# Patient Record
Sex: Female | Born: 1940 | Race: White | Hispanic: No | Smoking: Never smoker
Health system: Southern US, Community
[De-identification: ages and names within clinical notes are randomized; demographics above are authoritative.]

## PROBLEM LIST (undated history)

## (undated) DIAGNOSIS — J189 Pneumonia, unspecified organism: Secondary | ICD-10-CM

## (undated) DIAGNOSIS — F329 Major depressive disorder, single episode, unspecified: Secondary | ICD-10-CM

## (undated) DIAGNOSIS — R569 Unspecified convulsions: Secondary | ICD-10-CM

## (undated) DIAGNOSIS — K219 Gastro-esophageal reflux disease without esophagitis: Secondary | ICD-10-CM

## (undated) DIAGNOSIS — J302 Other seasonal allergic rhinitis: Secondary | ICD-10-CM

## (undated) DIAGNOSIS — G43909 Migraine, unspecified, not intractable, without status migrainosus: Secondary | ICD-10-CM

## (undated) DIAGNOSIS — M1990 Unspecified osteoarthritis, unspecified site: Secondary | ICD-10-CM

## (undated) DIAGNOSIS — F32A Depression, unspecified: Secondary | ICD-10-CM

## (undated) DIAGNOSIS — F419 Anxiety disorder, unspecified: Secondary | ICD-10-CM

## (undated) DIAGNOSIS — I639 Cerebral infarction, unspecified: Secondary | ICD-10-CM

## (undated) DIAGNOSIS — G40909 Epilepsy, unspecified, not intractable, without status epilepticus: Secondary | ICD-10-CM

## (undated) DIAGNOSIS — B019 Varicella without complication: Secondary | ICD-10-CM

## (undated) DIAGNOSIS — R011 Cardiac murmur, unspecified: Secondary | ICD-10-CM

## (undated) DIAGNOSIS — I10 Essential (primary) hypertension: Secondary | ICD-10-CM

## (undated) HISTORY — DX: Major depressive disorder, single episode, unspecified: F32.9

## (undated) HISTORY — DX: Depression, unspecified: F32.A

## (undated) HISTORY — PX: LYMPHADENECTOMY: SHX15

## (undated) HISTORY — DX: Epilepsy, unspecified, not intractable, without status epilepticus: G40.909

## (undated) HISTORY — DX: Other seasonal allergic rhinitis: J30.2

## (undated) HISTORY — DX: Migraine, unspecified, not intractable, without status migrainosus: G43.909

## (undated) HISTORY — PX: APPENDECTOMY: SHX54

## (undated) HISTORY — DX: Essential (primary) hypertension: I10

## (undated) HISTORY — DX: Varicella without complication: B01.9

## (undated) HISTORY — PX: KNEE ARTHROSCOPY: SUR90

## (undated) HISTORY — PX: ABDOMINAL HYSTERECTOMY: SHX81

## (undated) HISTORY — DX: Cardiac murmur, unspecified: R01.1

## (undated) HISTORY — DX: Gastro-esophageal reflux disease without esophagitis: K21.9

## (undated) HISTORY — PX: TONSILLECTOMY AND ADENOIDECTOMY: SUR1326

## (undated) HISTORY — DX: Anxiety disorder, unspecified: F41.9

## (undated) HISTORY — PX: CATARACT EXTRACTION: SUR2

---

## 2011-12-29 DIAGNOSIS — H35039 Hypertensive retinopathy, unspecified eye: Secondary | ICD-10-CM | POA: Diagnosis not present

## 2011-12-29 DIAGNOSIS — H5200 Hypermetropia, unspecified eye: Secondary | ICD-10-CM | POA: Diagnosis not present

## 2011-12-29 DIAGNOSIS — H2510 Age-related nuclear cataract, unspecified eye: Secondary | ICD-10-CM | POA: Diagnosis not present

## 2012-02-10 DIAGNOSIS — F331 Major depressive disorder, recurrent, moderate: Secondary | ICD-10-CM | POA: Diagnosis not present

## 2012-02-18 DIAGNOSIS — F331 Major depressive disorder, recurrent, moderate: Secondary | ICD-10-CM | POA: Diagnosis not present

## 2012-02-21 DIAGNOSIS — M545 Low back pain: Secondary | ICD-10-CM | POA: Diagnosis not present

## 2012-02-21 DIAGNOSIS — I10 Essential (primary) hypertension: Secondary | ICD-10-CM | POA: Diagnosis not present

## 2012-02-24 DIAGNOSIS — F331 Major depressive disorder, recurrent, moderate: Secondary | ICD-10-CM | POA: Diagnosis not present

## 2012-03-02 DIAGNOSIS — F331 Major depressive disorder, recurrent, moderate: Secondary | ICD-10-CM | POA: Diagnosis not present

## 2012-03-08 DIAGNOSIS — F331 Major depressive disorder, recurrent, moderate: Secondary | ICD-10-CM | POA: Diagnosis not present

## 2012-03-21 DIAGNOSIS — F331 Major depressive disorder, recurrent, moderate: Secondary | ICD-10-CM | POA: Diagnosis not present

## 2012-04-05 DIAGNOSIS — F331 Major depressive disorder, recurrent, moderate: Secondary | ICD-10-CM | POA: Diagnosis not present

## 2012-04-26 DIAGNOSIS — F331 Major depressive disorder, recurrent, moderate: Secondary | ICD-10-CM | POA: Diagnosis not present

## 2012-05-11 DIAGNOSIS — F331 Major depressive disorder, recurrent, moderate: Secondary | ICD-10-CM | POA: Diagnosis not present

## 2012-06-05 DIAGNOSIS — F331 Major depressive disorder, recurrent, moderate: Secondary | ICD-10-CM | POA: Diagnosis not present

## 2012-06-14 DIAGNOSIS — F331 Major depressive disorder, recurrent, moderate: Secondary | ICD-10-CM | POA: Diagnosis not present

## 2012-06-16 DIAGNOSIS — I10 Essential (primary) hypertension: Secondary | ICD-10-CM | POA: Diagnosis not present

## 2012-06-16 DIAGNOSIS — Z7901 Long term (current) use of anticoagulants: Secondary | ICD-10-CM | POA: Diagnosis not present

## 2012-06-19 DIAGNOSIS — I10 Essential (primary) hypertension: Secondary | ICD-10-CM | POA: Diagnosis not present

## 2012-06-19 DIAGNOSIS — Z7901 Long term (current) use of anticoagulants: Secondary | ICD-10-CM | POA: Diagnosis not present

## 2012-06-19 DIAGNOSIS — E876 Hypokalemia: Secondary | ICD-10-CM | POA: Diagnosis not present

## 2012-06-21 DIAGNOSIS — F331 Major depressive disorder, recurrent, moderate: Secondary | ICD-10-CM | POA: Diagnosis not present

## 2012-06-23 DIAGNOSIS — E876 Hypokalemia: Secondary | ICD-10-CM | POA: Diagnosis not present

## 2012-06-28 DIAGNOSIS — Z6827 Body mass index (BMI) 27.0-27.9, adult: Secondary | ICD-10-CM | POA: Diagnosis not present

## 2012-06-28 DIAGNOSIS — E876 Hypokalemia: Secondary | ICD-10-CM | POA: Diagnosis not present

## 2012-06-28 DIAGNOSIS — R002 Palpitations: Secondary | ICD-10-CM | POA: Diagnosis not present

## 2012-06-28 DIAGNOSIS — G40909 Epilepsy, unspecified, not intractable, without status epilepticus: Secondary | ICD-10-CM | POA: Diagnosis not present

## 2012-07-03 DIAGNOSIS — E876 Hypokalemia: Secondary | ICD-10-CM | POA: Diagnosis not present

## 2012-07-03 DIAGNOSIS — F331 Major depressive disorder, recurrent, moderate: Secondary | ICD-10-CM | POA: Diagnosis not present

## 2012-07-03 DIAGNOSIS — G40909 Epilepsy, unspecified, not intractable, without status epilepticus: Secondary | ICD-10-CM | POA: Diagnosis not present

## 2012-07-03 DIAGNOSIS — R002 Palpitations: Secondary | ICD-10-CM | POA: Diagnosis not present

## 2012-08-08 ENCOUNTER — Ambulatory Visit: Payer: Self-pay | Admitting: Licensed Clinical Social Worker

## 2012-08-17 ENCOUNTER — Ambulatory Visit (INDEPENDENT_AMBULATORY_CARE_PROVIDER_SITE_OTHER): Payer: Medicare Other | Admitting: Licensed Clinical Social Worker

## 2012-08-17 DIAGNOSIS — IMO0002 Reserved for concepts with insufficient information to code with codable children: Secondary | ICD-10-CM | POA: Diagnosis not present

## 2012-09-12 ENCOUNTER — Ambulatory Visit: Payer: Medicare Other | Admitting: Licensed Clinical Social Worker

## 2012-12-13 DIAGNOSIS — H35039 Hypertensive retinopathy, unspecified eye: Secondary | ICD-10-CM | POA: Diagnosis not present

## 2012-12-13 DIAGNOSIS — H2510 Age-related nuclear cataract, unspecified eye: Secondary | ICD-10-CM | POA: Diagnosis not present

## 2012-12-13 DIAGNOSIS — H524 Presbyopia: Secondary | ICD-10-CM | POA: Diagnosis not present

## 2013-01-17 DIAGNOSIS — E876 Hypokalemia: Secondary | ICD-10-CM | POA: Diagnosis not present

## 2013-01-17 DIAGNOSIS — I10 Essential (primary) hypertension: Secondary | ICD-10-CM | POA: Diagnosis not present

## 2013-01-17 DIAGNOSIS — F341 Dysthymic disorder: Secondary | ICD-10-CM | POA: Diagnosis not present

## 2013-01-17 DIAGNOSIS — G40909 Epilepsy, unspecified, not intractable, without status epilepticus: Secondary | ICD-10-CM | POA: Diagnosis not present

## 2013-01-18 DIAGNOSIS — H04129 Dry eye syndrome of unspecified lacrimal gland: Secondary | ICD-10-CM | POA: Diagnosis not present

## 2013-01-18 DIAGNOSIS — H2510 Age-related nuclear cataract, unspecified eye: Secondary | ICD-10-CM | POA: Diagnosis not present

## 2013-01-18 DIAGNOSIS — H18419 Arcus senilis, unspecified eye: Secondary | ICD-10-CM | POA: Diagnosis not present

## 2013-01-19 DIAGNOSIS — G40909 Epilepsy, unspecified, not intractable, without status epilepticus: Secondary | ICD-10-CM | POA: Diagnosis not present

## 2013-01-19 DIAGNOSIS — I10 Essential (primary) hypertension: Secondary | ICD-10-CM | POA: Diagnosis not present

## 2013-02-19 DIAGNOSIS — E876 Hypokalemia: Secondary | ICD-10-CM | POA: Diagnosis not present

## 2013-02-19 DIAGNOSIS — I10 Essential (primary) hypertension: Secondary | ICD-10-CM | POA: Diagnosis not present

## 2013-02-19 DIAGNOSIS — F341 Dysthymic disorder: Secondary | ICD-10-CM | POA: Diagnosis not present

## 2013-02-19 DIAGNOSIS — G40909 Epilepsy, unspecified, not intractable, without status epilepticus: Secondary | ICD-10-CM | POA: Diagnosis not present

## 2013-02-21 DIAGNOSIS — G40909 Epilepsy, unspecified, not intractable, without status epilepticus: Secondary | ICD-10-CM | POA: Diagnosis not present

## 2013-02-21 DIAGNOSIS — E876 Hypokalemia: Secondary | ICD-10-CM | POA: Diagnosis not present

## 2013-03-12 DIAGNOSIS — I10 Essential (primary) hypertension: Secondary | ICD-10-CM | POA: Diagnosis not present

## 2013-03-12 DIAGNOSIS — R609 Edema, unspecified: Secondary | ICD-10-CM | POA: Diagnosis not present

## 2013-03-12 DIAGNOSIS — E876 Hypokalemia: Secondary | ICD-10-CM | POA: Diagnosis not present

## 2013-03-12 DIAGNOSIS — Z6827 Body mass index (BMI) 27.0-27.9, adult: Secondary | ICD-10-CM | POA: Diagnosis not present

## 2013-03-13 DIAGNOSIS — E876 Hypokalemia: Secondary | ICD-10-CM | POA: Diagnosis not present

## 2013-03-27 ENCOUNTER — Ambulatory Visit: Payer: Medicare Other | Admitting: Family Medicine

## 2013-04-04 DIAGNOSIS — E876 Hypokalemia: Secondary | ICD-10-CM | POA: Diagnosis not present

## 2013-04-09 DIAGNOSIS — E876 Hypokalemia: Secondary | ICD-10-CM | POA: Diagnosis not present

## 2013-04-16 DIAGNOSIS — I10 Essential (primary) hypertension: Secondary | ICD-10-CM | POA: Diagnosis not present

## 2013-04-16 DIAGNOSIS — R011 Cardiac murmur, unspecified: Secondary | ICD-10-CM | POA: Diagnosis not present

## 2013-04-16 DIAGNOSIS — R609 Edema, unspecified: Secondary | ICD-10-CM | POA: Diagnosis not present

## 2013-05-02 DIAGNOSIS — H25049 Posterior subcapsular polar age-related cataract, unspecified eye: Secondary | ICD-10-CM | POA: Diagnosis not present

## 2013-05-02 DIAGNOSIS — R569 Unspecified convulsions: Secondary | ICD-10-CM | POA: Diagnosis not present

## 2013-05-02 DIAGNOSIS — H18419 Arcus senilis, unspecified eye: Secondary | ICD-10-CM | POA: Diagnosis not present

## 2013-05-02 DIAGNOSIS — F329 Major depressive disorder, single episode, unspecified: Secondary | ICD-10-CM | POA: Diagnosis not present

## 2013-05-02 DIAGNOSIS — Z961 Presence of intraocular lens: Secondary | ICD-10-CM | POA: Diagnosis not present

## 2013-05-02 DIAGNOSIS — I10 Essential (primary) hypertension: Secondary | ICD-10-CM | POA: Diagnosis not present

## 2013-05-02 DIAGNOSIS — H25019 Cortical age-related cataract, unspecified eye: Secondary | ICD-10-CM | POA: Diagnosis not present

## 2013-05-02 DIAGNOSIS — K219 Gastro-esophageal reflux disease without esophagitis: Secondary | ICD-10-CM | POA: Diagnosis not present

## 2013-05-02 DIAGNOSIS — H2510 Age-related nuclear cataract, unspecified eye: Secondary | ICD-10-CM | POA: Diagnosis not present

## 2013-05-02 DIAGNOSIS — Z79899 Other long term (current) drug therapy: Secondary | ICD-10-CM | POA: Diagnosis not present

## 2013-05-03 DIAGNOSIS — H2510 Age-related nuclear cataract, unspecified eye: Secondary | ICD-10-CM | POA: Diagnosis not present

## 2013-05-23 DIAGNOSIS — I10 Essential (primary) hypertension: Secondary | ICD-10-CM | POA: Diagnosis not present

## 2013-05-23 DIAGNOSIS — Z885 Allergy status to narcotic agent status: Secondary | ICD-10-CM | POA: Diagnosis not present

## 2013-05-23 DIAGNOSIS — H18419 Arcus senilis, unspecified eye: Secondary | ICD-10-CM | POA: Diagnosis not present

## 2013-05-23 DIAGNOSIS — H25049 Posterior subcapsular polar age-related cataract, unspecified eye: Secondary | ICD-10-CM | POA: Diagnosis not present

## 2013-05-23 DIAGNOSIS — G40309 Generalized idiopathic epilepsy and epileptic syndromes, not intractable, without status epilepticus: Secondary | ICD-10-CM | POA: Diagnosis not present

## 2013-05-23 DIAGNOSIS — H2510 Age-related nuclear cataract, unspecified eye: Secondary | ICD-10-CM | POA: Diagnosis not present

## 2013-05-23 DIAGNOSIS — Z961 Presence of intraocular lens: Secondary | ICD-10-CM | POA: Diagnosis not present

## 2013-07-03 DIAGNOSIS — L255 Unspecified contact dermatitis due to plants, except food: Secondary | ICD-10-CM | POA: Diagnosis not present

## 2013-10-08 DIAGNOSIS — I781 Nevus, non-neoplastic: Secondary | ICD-10-CM | POA: Diagnosis not present

## 2013-10-08 DIAGNOSIS — L821 Other seborrheic keratosis: Secondary | ICD-10-CM | POA: Diagnosis not present

## 2013-10-08 DIAGNOSIS — L738 Other specified follicular disorders: Secondary | ICD-10-CM | POA: Diagnosis not present

## 2013-10-08 DIAGNOSIS — D239 Other benign neoplasm of skin, unspecified: Secondary | ICD-10-CM | POA: Diagnosis not present

## 2013-11-15 ENCOUNTER — Ambulatory Visit (INDEPENDENT_AMBULATORY_CARE_PROVIDER_SITE_OTHER): Payer: Medicare Other | Admitting: Licensed Clinical Social Worker

## 2013-11-15 DIAGNOSIS — IMO0002 Reserved for concepts with insufficient information to code with codable children: Secondary | ICD-10-CM | POA: Diagnosis not present

## 2013-11-27 ENCOUNTER — Ambulatory Visit (INDEPENDENT_AMBULATORY_CARE_PROVIDER_SITE_OTHER): Payer: Medicare Other | Admitting: Licensed Clinical Social Worker

## 2013-11-27 DIAGNOSIS — IMO0002 Reserved for concepts with insufficient information to code with codable children: Secondary | ICD-10-CM | POA: Diagnosis not present

## 2013-12-13 ENCOUNTER — Ambulatory Visit: Payer: Medicare Other | Admitting: Licensed Clinical Social Worker

## 2013-12-25 ENCOUNTER — Ambulatory Visit (INDEPENDENT_AMBULATORY_CARE_PROVIDER_SITE_OTHER): Payer: Medicare Other | Admitting: Licensed Clinical Social Worker

## 2013-12-25 DIAGNOSIS — IMO0002 Reserved for concepts with insufficient information to code with codable children: Secondary | ICD-10-CM

## 2014-01-15 DIAGNOSIS — N390 Urinary tract infection, site not specified: Secondary | ICD-10-CM | POA: Diagnosis not present

## 2014-01-22 ENCOUNTER — Ambulatory Visit: Payer: Medicare Other | Admitting: Licensed Clinical Social Worker

## 2014-02-12 ENCOUNTER — Ambulatory Visit (INDEPENDENT_AMBULATORY_CARE_PROVIDER_SITE_OTHER): Payer: Medicare Other | Admitting: Licensed Clinical Social Worker

## 2014-02-12 DIAGNOSIS — IMO0002 Reserved for concepts with insufficient information to code with codable children: Secondary | ICD-10-CM

## 2014-02-20 ENCOUNTER — Telehealth: Payer: Self-pay

## 2014-02-20 NOTE — Telephone Encounter (Signed)
Left message for call back Non-identifiable   NEW PATIENT 

## 2014-02-21 ENCOUNTER — Encounter: Payer: Self-pay | Admitting: Family Medicine

## 2014-02-21 ENCOUNTER — Ambulatory Visit (INDEPENDENT_AMBULATORY_CARE_PROVIDER_SITE_OTHER): Payer: Medicare Other | Admitting: Family Medicine

## 2014-02-21 VITALS — BP 128/80 | HR 83 | Temp 98.2°F | Ht 65.0 in | Wt 165.4 lb

## 2014-02-21 DIAGNOSIS — Z1231 Encounter for screening mammogram for malignant neoplasm of breast: Secondary | ICD-10-CM

## 2014-02-21 DIAGNOSIS — E785 Hyperlipidemia, unspecified: Secondary | ICD-10-CM

## 2014-02-21 DIAGNOSIS — E2839 Other primary ovarian failure: Secondary | ICD-10-CM | POA: Diagnosis not present

## 2014-02-21 DIAGNOSIS — R569 Unspecified convulsions: Secondary | ICD-10-CM

## 2014-02-21 DIAGNOSIS — I10 Essential (primary) hypertension: Secondary | ICD-10-CM | POA: Diagnosis not present

## 2014-02-21 DIAGNOSIS — Z23 Encounter for immunization: Secondary | ICD-10-CM

## 2014-02-21 DIAGNOSIS — F411 Generalized anxiety disorder: Secondary | ICD-10-CM

## 2014-02-21 DIAGNOSIS — Z0000 Encounter for general adult medical examination without abnormal findings: Secondary | ICD-10-CM | POA: Diagnosis not present

## 2014-02-21 LAB — HEPATIC FUNCTION PANEL
ALT: 11 U/L (ref 0–35)
AST: 21 U/L (ref 0–37)
Albumin: 3.4 g/dL — ABNORMAL LOW (ref 3.5–5.2)
Alkaline Phosphatase: 52 U/L (ref 39–117)
BILIRUBIN DIRECT: 0 mg/dL (ref 0.0–0.3)
BILIRUBIN TOTAL: 0.3 mg/dL (ref 0.3–1.2)
Total Protein: 6.6 g/dL (ref 6.0–8.3)

## 2014-02-21 LAB — BASIC METABOLIC PANEL
BUN: 11 mg/dL (ref 6–23)
CALCIUM: 8.6 mg/dL (ref 8.4–10.5)
CHLORIDE: 94 meq/L — AB (ref 96–112)
CO2: 30 meq/L (ref 19–32)
Creatinine, Ser: 0.7 mg/dL (ref 0.4–1.2)
GFR: 85 mL/min (ref >60.00)
Glucose, Bld: 104 mg/dL — ABNORMAL HIGH (ref 70–99)
Potassium: 3.6 mEq/L (ref 3.5–5.1)
Sodium: 131 mEq/L — ABNORMAL LOW (ref 135–145)

## 2014-02-21 LAB — LIPID PANEL
Cholesterol: 221 mg/dL — ABNORMAL HIGH (ref 0–200)
HDL: 88.9 mg/dL (ref >39.00)
LDL CALC: 100 mg/dL — AB (ref 0–99)
TRIGLYCERIDES: 163 mg/dL — AB (ref 0.0–149.0)
Total CHOL/HDL Ratio: 2
VLDL: 32.6 mg/dL (ref 0.0–40.0)

## 2014-02-21 LAB — CBC WITH DIFFERENTIAL/PLATELET
Basophils Absolute: 0.1 10*3/uL (ref 0.0–0.1)
Basophils Relative: 0.9 % (ref 0.0–3.0)
EOS ABS: 1.3 10*3/uL — AB (ref 0.0–0.7)
Eosinophils Relative: 17.3 % — ABNORMAL HIGH (ref 0.0–5.0)
HCT: 41.5 % (ref 36.0–46.0)
Hemoglobin: 13.8 g/dL (ref 12.0–15.0)
LYMPHS PCT: 25.2 % (ref 12.0–46.0)
Lymphs Abs: 1.9 10*3/uL (ref 0.7–4.0)
MCHC: 33.3 g/dL (ref 30.0–36.0)
MCV: 94.1 fl (ref 78.0–100.0)
MONO ABS: 0.6 10*3/uL (ref 0.1–1.0)
Monocytes Relative: 7.5 % (ref 3.0–12.0)
NEUTROS PCT: 49.1 % (ref 43.0–77.0)
Neutro Abs: 3.8 10*3/uL (ref 1.4–7.7)
PLATELETS: 262 10*3/uL (ref 150.0–400.0)
RBC: 4.41 Mil/uL (ref 3.87–5.11)
RDW: 12.7 % (ref 11.5–14.6)
WBC: 7.7 10*3/uL (ref 4.5–10.5)

## 2014-02-21 LAB — POCT URINALYSIS DIPSTICK
Bilirubin, UA: NEGATIVE
Blood, UA: NEGATIVE
GLUCOSE UA: NEGATIVE
KETONES UA: NEGATIVE
Leukocytes, UA: NEGATIVE
Nitrite, UA: NEGATIVE
Protein, UA: NEGATIVE
SPEC GRAV UA: 1.01
Urobilinogen, UA: 0.2
pH, UA: 7

## 2014-02-21 MED ORDER — PHENYTOIN SODIUM EXTENDED 100 MG PO CAPS: 300.0000 mg | ORAL_CAPSULE | Freq: Every day | ORAL | Status: DC

## 2014-02-21 MED ORDER — ALPRAZOLAM 0.5 MG PO TABS: 0.5000 mg | ORAL_TABLET | Freq: Three times a day (TID) | ORAL | Status: DC

## 2014-02-21 MED ORDER — ZOSTER VACCINE LIVE 19400 UNT/0.65ML ~~LOC~~ SOLR: 0.6500 mL | Freq: Once | SUBCUTANEOUS | Status: DC

## 2014-02-21 MED ORDER — DESVENLAFAXINE SUCCINATE ER 100 MG PO TB24: 100.0000 mg | ORAL_TABLET | Freq: Every day | ORAL | Status: DC

## 2014-02-21 MED ORDER — POTASSIUM CHLORIDE ER 10 MEQ PO TBCR
EXTENDED_RELEASE_TABLET | ORAL | Status: DC
Start: 2014-02-21 — End: 2014-05-20

## 2014-02-21 MED ORDER — LISINOPRIL 10 MG PO TABS: 10.0000 mg | ORAL_TABLET | Freq: Every day | ORAL | Status: DC

## 2014-02-21 MED ORDER — ESTROGENS CONJUGATED 1.25 MG PO TABS: 1.2500 mg | ORAL_TABLET | Freq: Every day | ORAL | Status: DC

## 2014-02-21 MED ORDER — HYDROCHLOROTHIAZIDE 50 MG PO TABS: 50.0000 mg | ORAL_TABLET | Freq: Every day | ORAL | Status: DC

## 2014-02-21 NOTE — Patient Instructions (Signed)

## 2014-02-21 NOTE — Progress Notes (Signed)
Pre visit review using our clinic review tool, if applicable. No additional management support is needed unless otherwise documented below in the visit note. 

## 2014-02-21 NOTE — Progress Notes (Signed)
Subjective:    Allison Bass is a 73 y.o. female who presents for Medicare Initial preventive examination.  Preventive Screening-Counseling & Management  Tobacco History  Smoking status  . Never Smoker   Smokeless tobacco  . Never Used     Problems Prior to Visit 1. none  Current Problems (verified) There are no active problems to display for this patient.   Medications Prior to Visit No current outpatient prescriptions on file prior to visit.   No current facility-administered medications on file prior to visit.    Current Medications (verified) Current Outpatient Prescriptions  Medication Sig Dispense Refill  . ALPRAZolam (XANAX) 0.5 MG tablet Take 1 tablet by mouth 3 (three) times daily.      Marland Kitchen DILANTIN 100 MG ER capsule Take 300 mg by mouth daily.      . hydrochlorothiazide (HYDRODIURIL) 50 MG tablet Take 1 tablet by mouth daily.      Marland Kitchen lisinopril (PRINIVIL,ZESTRIL) 10 MG tablet Take 1 tablet by mouth daily.      . potassium chloride (K-DUR) 10 MEQ tablet Take 2 in the morning and 2 at night      . PREMARIN 1.25 MG tablet Take 1 tablet by mouth daily.      Marland Kitchen PRISTIQ 100 MG 24 hr tablet Take 1 tablet by mouth daily.       No current facility-administered medications for this visit.     Allergies (verified) Codeine   PAST HISTORY  Family History Family History  Problem Relation Age of Onset  . Alcoholism Father   . Breast cancer Maternal Grandmother   . Lung cancer Father     Smoker  . Heart disease Mother   . Stroke Father   . High blood pressure Mother   . High blood pressure Father   . Depression Mother   . Seizures Mother     Social History History  Substance Use Topics  . Smoking status: Never Smoker   . Smokeless tobacco: Never Used  . Alcohol Use: Yes     Comment: Occ     Are there smokers in your home (other than you)? No  Risk Factors Current exercise habits: The patient does not participate in regular exercise at present.   Dietary issues discussed: na   Cardiac risk factors: advanced age (older than 52 for men, 59 for women), hypertension and sedentary lifestyle.  Depression Screen (Note: if answer to either of the following is "Yes", a more complete depression screening is indicated)   Over the past 2 weeks, have you felt down, depressed or hopeless? No  Over the past 2 weeks, have you felt little interest or pleasure in doing things? No  Have you lost interest or pleasure in daily life? No  Do you often feel hopeless? No  Do you cry easily over simple problems? No  Activities of Daily Living In your present state of health, do you have any difficulty performing the following activities?:  Driving? No Managing money?  No Feeding yourself? No Getting from bed to chair? No Climbing a flight of stairs? No Preparing food and eating?: No Bathing or showering? No Getting dressed: No Getting to the toilet? No Using the toilet:No Moving around from place to place: No In the past year have you fallen or had a near fall?:No   Are you sexually active?  No  Do you have more than one partner?  No  Hearing Difficulties: yes-- right ear-- congenital Do you often ask people to  speak up or repeat themselves? No Do you experience ringing or noises in your ears? No Do you have difficulty understanding soft or whispered voices? No    Do you feel that you have a problem with memory? No  Do you often misplace items? No  Do you feel safe at home?  Yes  Cognitive Testing  Alert? Yes  Normal Appearance?No  Oriented to person? No  Place? No   Time? No  Recall of three objects?  No  Can perform simple calculations? No  Displays appropriate judgment?No  Can read the correct time from a watch face?No   Advanced Directives have been discussed with the patient? Yes  List the Names of Other Physician/Practitioners you currently use: 1.    Indicate any recent Medical Services you may have received from other  than [facility] providers in the past year (date may be approximate).  Immunization History  Administered Date(s) Administered  . Pneumococcal-Unspecified 02/22/2004  . Tdap 10/25/2006    Screening Tests There are no preventive care reminders to display for this patient.  All answers were reviewed with the patient and necessary referrals were made:  Garnet Koyanagi, DO   02/21/2014   History reviewed:  She  has a past medical history of Chicken pox; Depression; GERD (gastroesophageal reflux disease); Seasonal allergies; Migraine; Heart murmur; HTN (hypertension); and Epilepsy. She  does not have a problem list on file. She  has past surgical history that includes Abdominal hysterectomy; Appendectomy; Tonsillectomy and adenoidectomy; Cataract extraction (Bilateral); and Lymphadenectomy. Her family history includes Alcoholism in her father; Breast cancer in her maternal grandmother; Depression in her mother; Heart disease in her mother; High blood pressure in her father and mother; Lung cancer in her father; Seizures in her mother; Stroke in her father. She  reports that she has never smoked. She has never used smokeless tobacco. She reports that she drinks alcohol. She reports that she does not use illicit drugs. She has a current medication list which includes the following prescription(s): alprazolam, desvenlafaxine, estrogens (conjugated), hydrochlorothiazide, lisinopril, phenytoin, and potassium chloride. No current outpatient prescriptions on file prior to visit.   No current facility-administered medications on file prior to visit.   She is allergic to codeine.  Review of Systems  Review of Systems                                                             Constitutional: Negative for activity change, appetite change and fatigue.  HENT: Negative for hearing loss, congestion, tinnitus and ear discharge.   Eyes: Negative for visual  disturbance (see optho q1y -- vision corrected to 20/20 with glasses).  Respiratory: Negative for cough, chest tightness and shortness of breath.   Cardiovascular: Negative for chest pain, palpitations and leg swelling.  Gastrointestinal: Negative for abdominal pain, diarrhea, constipation and abdominal distention.  Genitourinary: Negative for urgency, frequency, decreased urine volume and difficulty urinating.  Musculoskeletal: Negative for back pain, arthralgias and gait problem.  Skin: Negative for color change, pallor and rash.  Neurological: Negative for dizziness, light-headedness, numbness and headaches.  Hematological: Negative for adenopathy. Does not bruise/bleed easily.  Psychiatric/Behavioral: Negative for suicidal ideas, confusion, sleep disturbance, self-injury, dysphoric mood, decreased concentration and agitation.  Pt is able to read and write and can do all ADLs No  risk for falling No abuse/ violence in home      Objective:     Vision by Snellen chart: opth  Body mass index is 27.52 kg/(m^2). BP 128/80  Pulse 83  Temp(Src) 98.2 F (36.8 C) (Oral)  Ht 5\' 5"  (1.651 m)  Wt 165 lb 6.4 oz (75.025 kg)  BMI 27.52 kg/m2  SpO2 97%  BP 128/80  Pulse 83  Temp(Src) 98.2 F (36.8 C) (Oral)  Ht 5\' 5"  (1.651 m)  Wt 165 lb 6.4 oz (75.025 kg)  BMI 27.52 kg/m2  SpO2 97% General appearance: alert, cooperative, appears stated age and no distress Head: Normocephalic, without obvious abnormality, atraumatic Eyes: conjunctivae/corneas clear. PERRL, EOM's intact. Fundi benign. Ears: normal TM's and external ear canals both ears Nose: Nares normal. Septum midline. Mucosa normal. No drainage or sinus tenderness. Throat: lips, mucosa, and tongue normal; teeth and gums normal Neck: no adenopathy, no carotid bruit, no JVD, supple, symmetrical, trachea midline and thyroid not enlarged, symmetric, no tenderness/mass/nodules Back: symmetric, no curvature. ROM normal. No CVA  tenderness. Lungs: clear to auscultation bilaterally Breasts: normal appearance, no masses or tenderness Heart: regular rate and rhythm, S1, S2 normal, no murmur, click, rub or gallop Abdomen: soft, non-tender; bowel sounds normal; no masses,  no organomegaly Pelvic: not indicated; post-menopausal, no abnormal Pap smears in past Extremities: extremities normal, atraumatic, no cyanosis or edema Pulses: 2+ and symmetric Skin: Skin color, texture, turgor normal. No rashes or lesions Lymph nodes: Cervical, supraclavicular, and axillary nodes normal. Neurologic: Alert and oriented X 3, normal strength and tone. Normal symmetric reflexes. Normal coordination and gait Psych- no depression, no anxiety--stable on meds      Assessment:     cpe      Plan:     During the course of the visit the patient was educated and counseled about appropriate screening and preventive services including:    Pneumococcal vaccine   Td vaccine  Screening electrocardiogram  Screening mammography  Bone densitometry screening  Colorectal cancer screening  Advanced directives: has an advanced directive - a copy HAS NOT been provided.  Diet review for nutrition referral? Yes ____  Not Indicated ___x_   Patient Instructions (the written plan) was given to the patient.  Medicare Attestation I have personally reviewed: The patient's medical and social history Their use of alcohol, tobacco or illicit drugs Their current medications and supplements The patient's functional ability including ADLs,fall risks, home safety risks, cognitive, and hearing and visual impairment Diet and physical activities Evidence for depression or mood disorders  The patient's weight, height, BMI, and visual acuity have been recorded in the chart.  I have made referrals, counseling, and provided education to the patient based on review of the above and I have provided the patient with a written personalized care plan for  preventive services.    1. Estrogen deficiency  - DG Bone Density; Future - estrogens, conjugated, (PREMARIN) 1.25 MG tablet; Take 1 tablet (1.25 mg total) by mouth daily.  Dispense: 90 tablet; Refill: 3  2. Other screening mammogram  - MM DIGITAL SCREENING BILATERAL; Future  3. Anxiety state, unspecified Stable , con't meds - desvenlafaxine (PRISTIQ) 100 MG 24 hr tablet; Take 1 tablet (100 mg total) by mouth daily.  Dispense: 90 tablet; Refill: 3 - zoster vaccine live, PF, (ZOSTAVAX) 23536 UNT/0.65ML injection; Inject 19,400 Units into the skin once.  Dispense: 1 vial; Refill: 0 - ALPRAZolam (XANAX) 0.5 MG tablet; Take 1 tablet (0.5 mg total) by mouth 3 (three)  times daily.  Dispense: 90 tablet; Refill: 5  4. Seizures Check dilantin level - phenytoin (DILANTIN) 100 MG ER capsule; Take 3 capsules (300 mg total) by mouth daily.  Dispense: 90 capsule; Refill: 3 - Dilantin (Phenytoin) level, total  5. HTN (hypertension) Stable Cont meds - hydrochlorothiazide (HYDRODIURIL) 50 MG tablet; Take 1 tablet (50 mg total) by mouth daily.  Dispense: 90 tablet; Refill: 3 - lisinopril (PRINIVIL,ZESTRIL) 10 MG tablet; Take 1 tablet (10 mg total) by mouth daily.  Dispense: 90 tablet; Refill: 3 - potassium chloride (K-DUR) 10 MEQ tablet; Take 2 in the morning and 2 at night  Dispense: 120 tablet; Refill: 5 - Basic metabolic panel - CBC with Differential - POCT urinalysis dipstick - Lipid panel - EKG 12-Lead  6. Other and unspecified hyperlipidemia Check labs - Hepatic function panel - Lipid panel - EKG 12-Lead  7. Medicare annual wellness visit, initial    8. Preventative health care    Garnet Koyanagi, DO   02/21/2014

## 2014-02-22 ENCOUNTER — Telehealth: Payer: Self-pay | Admitting: Family Medicine

## 2014-02-22 LAB — PHENYTOIN LEVEL, TOTAL: Phenytoin Lvl: 9.9 ug/mL — ABNORMAL LOW (ref 10.0–20.0)

## 2014-02-22 NOTE — Telephone Encounter (Signed)
Relevant patient education assigned to patient using Emmi. ° °

## 2014-02-25 ENCOUNTER — Encounter: Payer: Self-pay | Admitting: Family Medicine

## 2014-02-25 NOTE — Telephone Encounter (Signed)
Unable to reach pre visit.  

## 2014-03-20 ENCOUNTER — Ambulatory Visit
Admission: RE | Admit: 2014-03-20 | Discharge: 2014-03-20 | Disposition: A | Discharge status: Home / Self Care | Payer: Medicare Other | Source: Ambulatory Visit | Attending: Family Medicine | Admitting: Family Medicine

## 2014-03-20 DIAGNOSIS — Z1231 Encounter for screening mammogram for malignant neoplasm of breast: Secondary | ICD-10-CM

## 2014-03-20 DIAGNOSIS — M810 Age-related osteoporosis without current pathological fracture: Secondary | ICD-10-CM | POA: Diagnosis not present

## 2014-03-20 DIAGNOSIS — E2839 Other primary ovarian failure: Secondary | ICD-10-CM

## 2014-04-05 ENCOUNTER — Encounter: Payer: Self-pay | Admitting: Family Medicine

## 2014-05-17 ENCOUNTER — Encounter: Payer: Self-pay | Admitting: Family Medicine

## 2014-05-17 DIAGNOSIS — F411 Generalized anxiety disorder: Secondary | ICD-10-CM

## 2014-05-17 DIAGNOSIS — E2839 Other primary ovarian failure: Secondary | ICD-10-CM

## 2014-05-20 MED ORDER — LISINOPRIL 10 MG PO TABS: 10.0000 mg | ORAL_TABLET | Freq: Every day | ORAL | Status: DC

## 2014-05-20 MED ORDER — POTASSIUM CHLORIDE ER 10 MEQ PO TBCR: EXTENDED_RELEASE_TABLET | ORAL | Status: DC

## 2014-05-20 MED ORDER — DESVENLAFAXINE SUCCINATE ER 100 MG PO TB24: 100.0000 mg | ORAL_TABLET | Freq: Every day | ORAL | Status: DC

## 2014-05-20 MED ORDER — HYDROCHLOROTHIAZIDE 50 MG PO TABS: 50.0000 mg | ORAL_TABLET | Freq: Every day | ORAL | Status: DC

## 2014-05-20 MED ORDER — ESTROGENS CONJUGATED 1.25 MG PO TABS: 1.2500 mg | ORAL_TABLET | Freq: Every day | ORAL | Status: DC

## 2014-06-09 ENCOUNTER — Other Ambulatory Visit: Payer: Self-pay | Admitting: Family Medicine

## 2014-06-24 ENCOUNTER — Other Ambulatory Visit (INDEPENDENT_AMBULATORY_CARE_PROVIDER_SITE_OTHER): Payer: Medicare Other

## 2014-06-24 DIAGNOSIS — E785 Hyperlipidemia, unspecified: Secondary | ICD-10-CM | POA: Diagnosis not present

## 2014-06-25 ENCOUNTER — Encounter: Payer: Self-pay | Admitting: Family Medicine

## 2014-06-25 DIAGNOSIS — E2839 Other primary ovarian failure: Secondary | ICD-10-CM

## 2014-06-25 DIAGNOSIS — F411 Generalized anxiety disorder: Secondary | ICD-10-CM

## 2014-06-25 DIAGNOSIS — R569 Unspecified convulsions: Secondary | ICD-10-CM

## 2014-06-25 LAB — LIPID PANEL
Cholesterol: 227 mg/dL — ABNORMAL HIGH (ref 0–200)
HDL: 93.9 mg/dL (ref >39.00)
LDL Cholesterol: 113 mg/dL — ABNORMAL HIGH (ref 0–99)
NonHDL: 133.1
TRIGLYCERIDES: 103 mg/dL (ref 0.0–149.0)
Total CHOL/HDL Ratio: 2
VLDL: 20.6 mg/dL (ref 0.0–40.0)

## 2014-06-25 LAB — HEPATIC FUNCTION PANEL
ALBUMIN: 3.7 g/dL (ref 3.5–5.2)
ALK PHOS: 54 U/L (ref 39–117)
ALT: 18 U/L (ref 0–35)
AST: 35 U/L (ref 0–37)
Bilirubin, Direct: 0 mg/dL (ref 0.0–0.3)
TOTAL PROTEIN: 7 g/dL (ref 6.0–8.3)
Total Bilirubin: 0.4 mg/dL (ref 0.2–1.2)

## 2014-07-02 MED ORDER — PHENYTOIN SODIUM EXTENDED 100 MG PO CAPS: 300.0000 mg | ORAL_CAPSULE | Freq: Every day | ORAL | Status: DC

## 2014-07-02 MED ORDER — POTASSIUM CHLORIDE ER 10 MEQ PO TBCR: EXTENDED_RELEASE_TABLET | ORAL | Status: DC

## 2014-07-02 MED ORDER — HYDROCHLOROTHIAZIDE 50 MG PO TABS: 50.0000 mg | ORAL_TABLET | Freq: Every day | ORAL | Status: DC

## 2014-07-02 MED ORDER — DESVENLAFAXINE SUCCINATE ER 100 MG PO TB24: 100.0000 mg | ORAL_TABLET | Freq: Every day | ORAL | Status: DC

## 2014-07-02 MED ORDER — LISINOPRIL 10 MG PO TABS: ORAL_TABLET | ORAL | Status: DC

## 2014-07-02 MED ORDER — ALPRAZOLAM 0.5 MG PO TABS: 0.5000 mg | ORAL_TABLET | Freq: Three times a day (TID) | ORAL | Status: DC

## 2014-07-02 MED ORDER — ESTROGENS CONJUGATED 1.25 MG PO TABS: 1.2500 mg | ORAL_TABLET | Freq: Every day | ORAL | Status: DC

## 2014-07-02 NOTE — Addendum Note (Signed)
Addended by: Ewing Schlein on: 07/02/2014 08:45 AM   Modules accepted: Orders, Medications

## 2014-07-06 ENCOUNTER — Encounter: Payer: Self-pay | Admitting: Family Medicine

## 2014-08-19 ENCOUNTER — Encounter: Payer: Self-pay | Admitting: Family Medicine

## 2014-08-20 ENCOUNTER — Encounter: Payer: Self-pay | Admitting: Family Medicine

## 2014-08-20 DIAGNOSIS — Z1211 Encounter for screening for malignant neoplasm of colon: Secondary | ICD-10-CM

## 2014-08-20 NOTE — Telephone Encounter (Signed)
[facility]--- as long as she is having no problems we can schedule screening colon

## 2014-08-21 ENCOUNTER — Encounter: Payer: Self-pay | Admitting: Family Medicine

## 2014-08-21 DIAGNOSIS — Z1211 Encounter for screening for malignant neoplasm of colon: Secondary | ICD-10-CM

## 2014-09-05 ENCOUNTER — Other Ambulatory Visit: Payer: Self-pay | Admitting: Family Medicine

## 2014-09-26 DIAGNOSIS — H35033 Hypertensive retinopathy, bilateral: Secondary | ICD-10-CM | POA: Diagnosis not present

## 2014-09-26 DIAGNOSIS — H52223 Regular astigmatism, bilateral: Secondary | ICD-10-CM | POA: Diagnosis not present

## 2014-09-26 DIAGNOSIS — Z961 Presence of intraocular lens: Secondary | ICD-10-CM | POA: Diagnosis not present

## 2014-09-26 DIAGNOSIS — H524 Presbyopia: Secondary | ICD-10-CM | POA: Diagnosis not present

## 2014-10-06 ENCOUNTER — Other Ambulatory Visit: Payer: Self-pay | Admitting: Family Medicine

## 2014-10-10 ENCOUNTER — Other Ambulatory Visit: Payer: Self-pay | Admitting: Family Medicine

## 2014-10-10 NOTE — Telephone Encounter (Signed)
Last filled: 07/02/14   90, 2 refills No UDS  No contract on file Last OV:  02/21/14  Please advise.

## 2014-10-21 ENCOUNTER — Other Ambulatory Visit: Payer: Self-pay | Admitting: Family Medicine

## 2014-10-21 DIAGNOSIS — Z0000 Encounter for general adult medical examination without abnormal findings: Secondary | ICD-10-CM

## 2014-10-21 NOTE — Addendum Note (Signed)
Addended by: Ewing Schlein on: 10/21/2014 11:51 AM   Modules accepted: Orders

## 2014-11-08 ENCOUNTER — Other Ambulatory Visit: Payer: Self-pay | Admitting: Family Medicine

## 2014-11-08 DIAGNOSIS — R569 Unspecified convulsions: Secondary | ICD-10-CM

## 2014-11-08 MED ORDER — PHENYTOIN SODIUM EXTENDED 100 MG PO CAPS: 300.0000 mg | ORAL_CAPSULE | Freq: Every day | ORAL | Status: DC

## 2014-11-08 NOTE — Telephone Encounter (Signed)
Patient requesting refill of phenytoin (DILANTIN) also.

## 2014-11-08 NOTE — Telephone Encounter (Signed)
Last seen 02/21/14 and filled 10/10/14 #90 NO UDS  Please advise     KP

## 2014-12-03 ENCOUNTER — Other Ambulatory Visit: Payer: Self-pay | Admitting: Family Medicine

## 2014-12-05 ENCOUNTER — Other Ambulatory Visit: Payer: Self-pay | Admitting: Family Medicine

## 2014-12-05 NOTE — Telephone Encounter (Signed)
Last seen 02/21/14 and filled 11/08/14 #90   Please advise     KP

## 2014-12-09 ENCOUNTER — Encounter: Payer: Self-pay | Admitting: Family Medicine

## 2015-01-02 ENCOUNTER — Other Ambulatory Visit: Payer: Self-pay | Admitting: Family Medicine

## 2015-01-02 NOTE — Telephone Encounter (Signed)
Last filled: 07/02/14 Amt:  30, 5 refills Last OV:  02/21/14  Med filled x 1

## 2015-01-04 ENCOUNTER — Other Ambulatory Visit: Payer: Self-pay | Admitting: Family Medicine

## 2015-01-04 NOTE — Telephone Encounter (Signed)
Please call pt to schedule fasting CPE with Dr Etter Sjogren in April.

## 2015-01-06 NOTE — Telephone Encounter (Signed)
Appointment scheduled for 02/24/15

## 2015-01-07 ENCOUNTER — Encounter: Payer: Self-pay | Admitting: Internal Medicine

## 2015-01-21 ENCOUNTER — Ambulatory Visit (AMBULATORY_SURGERY_CENTER): Payer: Self-pay | Admitting: *Deleted

## 2015-01-21 VITALS — Ht 65.0 in | Wt 147.0 lb

## 2015-01-21 DIAGNOSIS — Z1211 Encounter for screening for malignant neoplasm of colon: Secondary | ICD-10-CM

## 2015-01-21 MED ORDER — MOVIPREP 100 G PO SOLR: ORAL | Status: DC

## 2015-01-21 NOTE — Progress Notes (Signed)
No egg or soy allergy  No anesthesia or intubation problems per pt  No diet medications taken   

## 2015-02-02 ENCOUNTER — Other Ambulatory Visit: Payer: Self-pay | Admitting: Family Medicine

## 2015-02-05 ENCOUNTER — Encounter: Payer: PRIVATE HEALTH INSURANCE | Admitting: Internal Medicine

## 2015-02-06 ENCOUNTER — Telehealth: Payer: Self-pay | Admitting: Family Medicine

## 2015-02-06 NOTE — Telephone Encounter (Signed)
Pre visit letter sent  °

## 2015-02-07 ENCOUNTER — Encounter: Payer: Self-pay | Admitting: Family Medicine

## 2015-02-07 ENCOUNTER — Other Ambulatory Visit: Payer: Self-pay | Admitting: Family Medicine

## 2015-02-07 ENCOUNTER — Other Ambulatory Visit: Payer: Self-pay | Admitting: Physician Assistant

## 2015-02-07 MED ORDER — ALPRAZOLAM 0.5 MG PO TABS: 0.5000 mg | ORAL_TABLET | Freq: Three times a day (TID) | ORAL | Status: DC

## 2015-02-10 ENCOUNTER — Telehealth: Payer: Self-pay | Admitting: Internal Medicine

## 2015-02-10 ENCOUNTER — Encounter: Payer: Self-pay | Admitting: *Deleted

## 2015-02-10 DIAGNOSIS — Z1211 Encounter for screening for malignant neoplasm of colon: Secondary | ICD-10-CM

## 2015-02-10 MED ORDER — NA SULFATE-K SULFATE-MG SULF 17.5-3.13-1.6 GM/177ML PO SOLN: 1.0000 | Freq: Once | ORAL | Status: DC

## 2015-02-10 NOTE — Telephone Encounter (Signed)
Pt states her movi prep is over 100$ and she cannot p[ay for this.  Pt to come to [facility] and get a suprep sample and new instructions. New suprep instructions discussed over the phone with Pt and instructed after she gets these if she has questions please ask a [facility] nurse the day she gets them or she can all..pt verbalized understanding   Samples of this drug were given to the patient, quantity 1, Lot Number [id number]  Allison Bass [facility]

## 2015-02-24 ENCOUNTER — Ambulatory Visit (INDEPENDENT_AMBULATORY_CARE_PROVIDER_SITE_OTHER): Payer: Medicare Other | Admitting: Family Medicine

## 2015-02-24 ENCOUNTER — Encounter: Payer: Self-pay | Admitting: Family Medicine

## 2015-02-24 VITALS — BP 138/79 | HR 71 | Temp 98.0°F | Ht 65.0 in | Wt 150.0 lb

## 2015-02-24 DIAGNOSIS — Z0000 Encounter for general adult medical examination without abnormal findings: Secondary | ICD-10-CM

## 2015-02-24 DIAGNOSIS — Z23 Encounter for immunization: Secondary | ICD-10-CM

## 2015-02-24 DIAGNOSIS — R569 Unspecified convulsions: Secondary | ICD-10-CM | POA: Diagnosis not present

## 2015-02-24 DIAGNOSIS — Z780 Asymptomatic menopausal state: Secondary | ICD-10-CM | POA: Diagnosis not present

## 2015-02-24 DIAGNOSIS — F419 Anxiety disorder, unspecified: Secondary | ICD-10-CM | POA: Diagnosis not present

## 2015-02-24 DIAGNOSIS — I10 Essential (primary) hypertension: Secondary | ICD-10-CM | POA: Diagnosis not present

## 2015-02-24 DIAGNOSIS — G40909 Epilepsy, unspecified, not intractable, without status epilepticus: Secondary | ICD-10-CM | POA: Insufficient documentation

## 2015-02-24 DIAGNOSIS — I150 Renovascular hypertension: Secondary | ICD-10-CM

## 2015-02-24 DIAGNOSIS — G40802 Other epilepsy, not intractable, without status epilepticus: Secondary | ICD-10-CM

## 2015-02-24 MED ORDER — LISINOPRIL 10 MG PO TABS: 10.0000 mg | ORAL_TABLET | Freq: Every day | ORAL | Status: DC

## 2015-02-24 MED ORDER — POTASSIUM CHLORIDE ER 10 MEQ PO TBCR: EXTENDED_RELEASE_TABLET | ORAL | Status: DC

## 2015-02-24 MED ORDER — ZOSTER VACCINE LIVE 19400 UNT/0.65ML ~~LOC~~ SOLR: 0.6500 mL | Freq: Once | SUBCUTANEOUS | Status: DC

## 2015-02-24 MED ORDER — DESVENLAFAXINE SUCCINATE ER 100 MG PO TB24: 100.0000 mg | ORAL_TABLET | Freq: Every day | ORAL | Status: DC

## 2015-02-24 MED ORDER — PREMARIN 1.25 MG PO TABS: 1.2500 mg | ORAL_TABLET | Freq: Every day | ORAL | Status: DC

## 2015-02-24 MED ORDER — DILANTIN 100 MG PO CAPS: 300.0000 mg | ORAL_CAPSULE | Freq: Every day | ORAL | Status: DC

## 2015-02-24 MED ORDER — ALPRAZOLAM 0.5 MG PO TABS: 0.5000 mg | ORAL_TABLET | Freq: Three times a day (TID) | ORAL | Status: DC

## 2015-02-24 MED ORDER — HYDROCHLOROTHIAZIDE 50 MG PO TABS: 50.0000 mg | ORAL_TABLET | Freq: Every day | ORAL | Status: DC

## 2015-02-24 MED ORDER — PNEUMOCOCCAL 13-VAL CONJ VACC IM SUSP: 0.5000 mL | INTRAMUSCULAR | Status: DC

## 2015-02-24 NOTE — Assessment & Plan Note (Signed)
con't lisinopril hct

## 2015-02-24 NOTE — Patient Instructions (Signed)
Preventive Care for Adults A healthy lifestyle and preventive care can promote health and wellness. Preventive health guidelines for women include the following key practices.  A routine yearly physical is a good way to check with your health care provider about your health and preventive screening. It is a chance to share any concerns and updates on your health and to receive a thorough exam.  Visit your dentist for a routine exam and preventive care every 6 months. Brush your teeth twice a day and floss once a day. Good oral hygiene prevents tooth decay and gum disease.  The frequency of eye exams is based on your age, health, family medical history, use of contact lenses, and other factors. Follow your health care provider's recommendations for frequency of eye exams.  Eat a healthy diet. Foods like vegetables, fruits, whole grains, low-fat dairy products, and lean protein foods contain the nutrients you need without too many calories. Decrease your intake of foods high in solid fats, added sugars, and salt. Eat the right amount of calories for you.Get information about a proper diet from your health care provider, if necessary.  Regular physical exercise is one of the most important things you can do for your health. Most adults should get at least 150 minutes of moderate-intensity exercise (any activity that increases your heart rate and causes you to sweat) each week. In addition, most adults need muscle-strengthening exercises on 2 or more days a week.  Maintain a healthy weight. The body mass index (BMI) is a screening tool to identify possible weight problems. It provides an estimate of body fat based on height and weight. Your health care provider can find your BMI and can help you achieve or maintain a healthy weight.For adults 20 years and older:  A BMI below 18.5 is considered underweight.  A BMI of 18.5 to 24.9 is normal.  A BMI of 25 to 29.9 is considered overweight.  A BMI of  30 and above is considered obese.  Maintain normal blood lipids and cholesterol levels by exercising and minimizing your intake of saturated fat. Eat a balanced diet with plenty of fruit and vegetables. Blood tests for lipids and cholesterol should begin at age 76 and be repeated every 5 years. If your lipid or cholesterol levels are high, you are over 50, or you are at high risk for heart disease, you may need your cholesterol levels checked more frequently.Ongoing high lipid and cholesterol levels should be treated with medicines if diet and exercise are not working.  If you smoke, find out from your health care provider how to quit. If you do not use tobacco, do not start.  Lung cancer screening is recommended for adults aged 22-80 years who are at high risk for developing lung cancer because of a history of smoking. A yearly low-dose CT scan of the lungs is recommended for people who have at least a 30-pack-year history of smoking and are a current smoker or have quit within the past 15 years. A pack year of smoking is smoking an average of 1 pack of cigarettes a day for 1 year (for example: 1 pack a day for 30 years or 2 packs a day for 15 years). Yearly screening should continue until the smoker has stopped smoking for at least 15 years. Yearly screening should be stopped for people who develop a health problem that would prevent them from having lung cancer treatment.  If you are pregnant, do not drink alcohol. If you are breastfeeding,  be very cautious about drinking alcohol. If you are not pregnant and choose to drink alcohol, do not have more than 1 drink per day. One drink is considered to be 12 ounces (355 mL) of beer, 5 ounces (148 mL) of wine, or 1.5 ounces (44 mL) of liquor.  Avoid use of street drugs. Do not share needles with anyone. Ask for help if you need support or instructions about stopping the use of drugs.  High blood pressure causes heart disease and increases the risk of  stroke. Your blood pressure should be checked at least every 1 to 2 years. Ongoing high blood pressure should be treated with medicines if weight loss and exercise do not work.  If you are 3-86 years old, ask your health care provider if you should take aspirin to prevent strokes.  Diabetes screening involves taking a blood sample to check your fasting blood sugar level. This should be done once every 3 years, after age 67, if you are within normal weight and without risk factors for diabetes. Testing should be considered at a younger age or be carried out more frequently if you are overweight and have at least 1 risk factor for diabetes.  Breast cancer screening is essential preventive care for women. You should practice "breast self-awareness." This means understanding the normal appearance and feel of your breasts and may include breast self-examination. Any changes detected, no matter how small, should be reported to a health care provider. Women in their 8s and 30s should have a clinical breast exam (CBE) by a health care provider as part of a regular health exam every 1 to 3 years. After age 70, women should have a CBE every year. Starting at age 25, women should consider having a mammogram (breast X-ray test) every year. Women who have a family history of breast cancer should talk to their health care provider about genetic screening. Women at a high risk of breast cancer should talk to their health care providers about having an MRI and a mammogram every year.  Breast cancer gene (BRCA)-related cancer risk assessment is recommended for women who have family members with BRCA-related cancers. BRCA-related cancers include breast, ovarian, tubal, and peritoneal cancers. Having family members with these cancers may be associated with an increased risk for harmful changes (mutations) in the breast cancer genes BRCA1 and BRCA2. Results of the assessment will determine the need for genetic counseling and  BRCA1 and BRCA2 testing.  Routine pelvic exams to screen for cancer are no longer recommended for nonpregnant women who are considered low risk for cancer of the pelvic organs (ovaries, uterus, and vagina) and who do not have symptoms. Ask your health care provider if a screening pelvic exam is right for you.  If you have had past treatment for cervical cancer or a condition that could lead to cancer, you need Pap tests and screening for cancer for at least 20 years after your treatment. If Pap tests have been discontinued, your risk factors (such as having a new sexual partner) need to be reassessed to determine if screening should be resumed. Some women have medical problems that increase the chance of getting cervical cancer. In these cases, your health care provider may recommend more frequent screening and Pap tests.  The HPV test is an additional test that may be used for cervical cancer screening. The HPV test looks for the virus that can cause the cell changes on the cervix. The cells collected during the Pap test can be  tested for HPV. The HPV test could be used to screen women aged 30 years and older, and should be used in women of any age who have unclear Pap test results. After the age of 30, women should have HPV testing at the same frequency as a Pap test.  Colorectal cancer can be detected and often prevented. Most routine colorectal cancer screening begins at the age of 50 years and continues through age 75 years. However, your health care provider may recommend screening at an earlier age if you have risk factors for colon cancer. On a yearly basis, your health care provider may provide home test kits to check for hidden blood in the stool. Use of a small camera at the end of a tube, to directly examine the colon (sigmoidoscopy or colonoscopy), can detect the earliest forms of colorectal cancer. Talk to your health care provider about this at age 50, when routine screening begins. Direct  exam of the colon should be repeated every 5-10 years through age 75 years, unless early forms of pre-cancerous polyps or small growths are found.  People who are at an increased risk for hepatitis B should be screened for this virus. You are considered at high risk for hepatitis B if:  You were born in a country where hepatitis B occurs often. Talk with your health care provider about which countries are considered high risk.  Your parents were born in a high-risk country and you have not received a shot to protect against hepatitis B (hepatitis B vaccine).  You have HIV or AIDS.  You use needles to inject street drugs.  You live with, or have sex with, someone who has hepatitis B.  You get hemodialysis treatment.  You take certain medicines for conditions like cancer, organ transplantation, and autoimmune conditions.  Hepatitis C blood testing is recommended for all people born from 1945 through 1965 and any individual with known risks for hepatitis C.  Practice safe sex. Use condoms and avoid high-risk sexual practices to reduce the spread of sexually transmitted infections (STIs). STIs include gonorrhea, chlamydia, syphilis, trichomonas, herpes, HPV, and human immunodeficiency virus (HIV). Herpes, HIV, and HPV are viral illnesses that have no cure. They can result in disability, cancer, and death.  You should be screened for sexually transmitted illnesses (STIs) including gonorrhea and chlamydia if:  You are sexually active and are younger than 24 years.  You are older than 24 years and your health care provider tells you that you are at risk for this type of infection.  Your sexual activity has changed since you were last screened and you are at an increased risk for chlamydia or gonorrhea. Ask your health care provider if you are at risk.  If you are at risk of being infected with HIV, it is recommended that you take a prescription medicine daily to prevent HIV infection. This is  called preexposure prophylaxis (PrEP). You are considered at risk if:  You are a heterosexual woman, are sexually active, and are at increased risk for HIV infection.  You take drugs by injection.  You are sexually active with a partner who has HIV.  Talk with your health care provider about whether you are at high risk of being infected with HIV. If you choose to begin PrEP, you should first be tested for HIV. You should then be tested every 3 months for as long as you are taking PrEP.  Osteoporosis is a disease in which the bones lose minerals and strength   with aging. This can result in serious bone fractures or breaks. The risk of osteoporosis can be identified using a bone density scan. Women ages 65 years and over and women at risk for fractures or osteoporosis should discuss screening with their health care providers. Ask your health care provider whether you should take a calcium supplement or vitamin D to reduce the rate of osteoporosis.  Menopause can be associated with physical symptoms and risks. Hormone replacement therapy is available to decrease symptoms and risks. You should talk to your health care provider about whether hormone replacement therapy is right for you.  Use sunscreen. Apply sunscreen liberally and repeatedly throughout the day. You should seek shade when your shadow is shorter than you. Protect yourself by wearing long sleeves, pants, a wide-brimmed hat, and sunglasses year round, whenever you are outdoors.  Once a month, do a whole body skin exam, using a mirror to look at the skin on your back. Tell your health care provider of new moles, moles that have irregular borders, moles that are larger than a pencil eraser, or moles that have changed in shape or color.  Stay current with required vaccines (immunizations).  Influenza vaccine. All adults should be immunized every year.  Tetanus, diphtheria, and acellular pertussis (Td, Tdap) vaccine. Pregnant women should  receive 1 dose of Tdap vaccine during each pregnancy. The dose should be obtained regardless of the length of time since the last dose. Immunization is preferred during the 27th-36th week of gestation. An adult who has not previously received Tdap or who does not know her vaccine status should receive 1 dose of Tdap. This initial dose should be followed by tetanus and diphtheria toxoids (Td) booster doses every 10 years. Adults with an unknown or incomplete history of completing a 3-dose immunization series with Td-containing vaccines should begin or complete a primary immunization series including a Tdap dose. Adults should receive a Td booster every 10 years.  Varicella vaccine. An adult without evidence of immunity to varicella should receive 2 doses or a second dose if she has previously received 1 dose. Pregnant females who do not have evidence of immunity should receive the first dose after pregnancy. This first dose should be obtained before leaving the health care facility. The second dose should be obtained 4-8 weeks after the first dose.  Human papillomavirus (HPV) vaccine. Females aged 13-26 years who have not received the vaccine previously should obtain the 3-dose series. The vaccine is not recommended for use in pregnant females. However, pregnancy testing is not needed before receiving a dose. If a female is found to be pregnant after receiving a dose, no treatment is needed. In that case, the remaining doses should be delayed until after the pregnancy. Immunization is recommended for any person with an immunocompromised condition through the age of 26 years if she did not get any or all doses earlier. During the 3-dose series, the second dose should be obtained 4-8 weeks after the first dose. The third dose should be obtained 24 weeks after the first dose and 16 weeks after the second dose.  Zoster vaccine. One dose is recommended for adults aged 60 years or older unless certain conditions are  present.  Measles, mumps, and rubella (MMR) vaccine. Adults born before 1957 generally are considered immune to measles and mumps. Adults born in 1957 or later should have 1 or more doses of MMR vaccine unless there is a contraindication to the vaccine or there is laboratory evidence of immunity to   each of the three diseases. A routine second dose of MMR vaccine should be obtained at least 28 days after the first dose for students attending postsecondary schools, health care workers, or international travelers. People who received inactivated measles vaccine or an unknown type of measles vaccine during 1963-1967 should receive 2 doses of MMR vaccine. People who received inactivated mumps vaccine or an unknown type of mumps vaccine before 1979 and are at high risk for mumps infection should consider immunization with 2 doses of MMR vaccine. For females of childbearing age, rubella immunity should be determined. If there is no evidence of immunity, females who are not pregnant should be vaccinated. If there is no evidence of immunity, females who are pregnant should delay immunization until after pregnancy. Unvaccinated health care workers born before 1957 who lack laboratory evidence of measles, mumps, or rubella immunity or laboratory confirmation of disease should consider measles and mumps immunization with 2 doses of MMR vaccine or rubella immunization with 1 dose of MMR vaccine.  Pneumococcal 13-valent conjugate (PCV13) vaccine. When indicated, a person who is uncertain of her immunization history and has no record of immunization should receive the PCV13 vaccine. An adult aged 19 years or older who has certain medical conditions and has not been previously immunized should receive 1 dose of PCV13 vaccine. This PCV13 should be followed with a dose of pneumococcal polysaccharide (PPSV23) vaccine. The PPSV23 vaccine dose should be obtained at least 8 weeks after the dose of PCV13 vaccine. An adult aged 19  years or older who has certain medical conditions and previously received 1 or more doses of PPSV23 vaccine should receive 1 dose of PCV13. The PCV13 vaccine dose should be obtained 1 or more years after the last PPSV23 vaccine dose.  Pneumococcal polysaccharide (PPSV23) vaccine. When PCV13 is also indicated, PCV13 should be obtained first. All adults aged 65 years and older should be immunized. An adult younger than age 65 years who has certain medical conditions should be immunized. Any person who resides in a nursing home or long-term care facility should be immunized. An adult smoker should be immunized. People with an immunocompromised condition and certain other conditions should receive both PCV13 and PPSV23 vaccines. People with human immunodeficiency virus (HIV) infection should be immunized as soon as possible after diagnosis. Immunization during chemotherapy or radiation therapy should be avoided. Routine use of PPSV23 vaccine is not recommended for American Indians, [place] Natives, or people younger than 65 years unless there are medical conditions that require PPSV23 vaccine. When indicated, people who have unknown immunization and have no record of immunization should receive PPSV23 vaccine. One-time revaccination 5 years after the first dose of PPSV23 is recommended for people aged 19-64 years who have chronic kidney failure, nephrotic syndrome, asplenia, or immunocompromised conditions. People who received 1-2 doses of PPSV23 before age 65 years should receive another dose of PPSV23 vaccine at age 65 years or later if at least 5 years have passed since the previous dose. Doses of PPSV23 are not needed for people immunized with PPSV23 at or after age 65 years.  Meningococcal vaccine. Adults with asplenia or persistent complement component deficiencies should receive 2 doses of quadrivalent meningococcal conjugate (MenACWY-D) vaccine. The doses should be obtained at least 2 months apart.  Microbiologists working with certain meningococcal bacteria, military recruits, people at risk during an outbreak, and people who travel to or live in countries with a high rate of meningitis should be immunized. A first-year college student up through age   21 years who is living in a residence hall should receive a dose if she did not receive a dose on or after her 16th birthday. Adults who have certain high-risk conditions should receive one or more doses of vaccine.  Hepatitis A vaccine. Adults who wish to be protected from this disease, have certain high-risk conditions, work with hepatitis A-infected animals, work in hepatitis A research labs, or travel to or work in countries with a high rate of hepatitis A should be immunized. Adults who were previously unvaccinated and who anticipate close contact with an international adoptee during the first 60 days after arrival in the [place] from a country with a high rate of hepatitis A should be immunized.  Hepatitis B vaccine. Adults who wish to be protected from this disease, have certain high-risk conditions, may be exposed to blood or other infectious body fluids, are household contacts or sex partners of hepatitis B positive people, are clients or workers in certain care facilities, or travel to or work in countries with a high rate of hepatitis B should be immunized.  Haemophilus influenzae type b (Hib) vaccine. A previously unvaccinated person with asplenia or sickle cell disease or having a scheduled splenectomy should receive 1 dose of Hib vaccine. Regardless of previous immunization, a recipient of a hematopoietic stem cell transplant should receive a 3-dose series 6-12 months after her successful transplant. Hib vaccine is not recommended for adults with HIV infection. Preventive Services / Frequency Ages 64 to 68 years  Blood pressure check.** / Every 1 to 2 years.  Lipid and cholesterol check.** / Every 5 years beginning at age  22.  Clinical breast exam.** / Every 3 years for women in their 88s and 53s.  BRCA-related cancer risk assessment.** / For women who have family members with a BRCA-related cancer (breast, ovarian, tubal, or peritoneal cancers).  Pap test.** / Every 2 years from ages 90 through 51. Every 3 years starting at age 21 through age 56 or 3 with a history of 3 consecutive normal Pap tests.  HPV screening.** / Every 3 years from ages 24 through ages 1 to 46 with a history of 3 consecutive normal Pap tests.  Hepatitis C blood test.** / For any individual with known risks for hepatitis C.  Skin self-exam. / Monthly.  Influenza vaccine. / Every year.  Tetanus, diphtheria, and acellular pertussis (Tdap, Td) vaccine.** / Consult your health care provider. Pregnant women should receive 1 dose of Tdap vaccine during each pregnancy. 1 dose of Td every 10 years.  Varicella vaccine.** / Consult your health care provider. Pregnant females who do not have evidence of immunity should receive the first dose after pregnancy.  HPV vaccine. / 3 doses over 6 months, if 72 and younger. The vaccine is not recommended for use in pregnant females. However, pregnancy testing is not needed before receiving a dose.  Measles, mumps, rubella (MMR) vaccine.** / You need at least 1 dose of MMR if you were born in 1957 or later. You may also need a 2nd dose. For females of childbearing age, rubella immunity should be determined. If there is no evidence of immunity, females who are not pregnant should be vaccinated. If there is no evidence of immunity, females who are pregnant should delay immunization until after pregnancy.  Pneumococcal 13-valent conjugate (PCV13) vaccine.** / Consult your health care provider.  Pneumococcal polysaccharide (PPSV23) vaccine.** / 1 to 2 doses if you smoke cigarettes or if you have certain conditions.  Meningococcal vaccine.** /  1 dose if you are age 19 to 21 years and a first-year college  student living in a residence hall, or have one of several medical conditions, you need to get vaccinated against meningococcal disease. You may also need additional booster doses.  Hepatitis A vaccine.** / Consult your health care provider.  Hepatitis B vaccine.** / Consult your health care provider.  Haemophilus influenzae type b (Hib) vaccine.** / Consult your health care provider. Ages 40 to 64 years  Blood pressure check.** / Every 1 to 2 years.  Lipid and cholesterol check.** / Every 5 years beginning at age 20 years.  Lung cancer screening. / Every year if you are aged 55-80 years and have a 30-pack-year history of smoking and currently smoke or have quit within the past 15 years. Yearly screening is stopped once you have quit smoking for at least 15 years or develop a health problem that would prevent you from having lung cancer treatment.  Clinical breast exam.** / Every year after age 40 years.  BRCA-related cancer risk assessment.** / For women who have family members with a BRCA-related cancer (breast, ovarian, tubal, or peritoneal cancers).  Mammogram.** / Every year beginning at age 40 years and continuing for as long as you are in good health. Consult with your health care provider.  Pap test.** / Every 3 years starting at age 30 years through age 65 or 70 years with a history of 3 consecutive normal Pap tests.  HPV screening.** / Every 3 years from ages 30 years through ages 65 to 70 years with a history of 3 consecutive normal Pap tests.  Fecal occult blood test (FOBT) of stool. / Every year beginning at age 50 years and continuing until age 75 years. You may not need to do this test if you get a colonoscopy every 10 years.  Flexible sigmoidoscopy or colonoscopy.** / Every 5 years for a flexible sigmoidoscopy or every 10 years for a colonoscopy beginning at age 50 years and continuing until age 75 years.  Hepatitis C blood test.** / For all people born from 1945 through  1965 and any individual with known risks for hepatitis C.  Skin self-exam. / Monthly.  Influenza vaccine. / Every year.  Tetanus, diphtheria, and acellular pertussis (Tdap/Td) vaccine.** / Consult your health care provider. Pregnant women should receive 1 dose of Tdap vaccine during each pregnancy. 1 dose of Td every 10 years.  Varicella vaccine.** / Consult your health care provider. Pregnant females who do not have evidence of immunity should receive the first dose after pregnancy.  Zoster vaccine.** / 1 dose for adults aged 60 years or older.  Measles, mumps, rubella (MMR) vaccine.** / You need at least 1 dose of MMR if you were born in 1957 or later. You may also need a 2nd dose. For females of childbearing age, rubella immunity should be determined. If there is no evidence of immunity, females who are not pregnant should be vaccinated. If there is no evidence of immunity, females who are pregnant should delay immunization until after pregnancy.  Pneumococcal 13-valent conjugate (PCV13) vaccine.** / Consult your health care provider.  Pneumococcal polysaccharide (PPSV23) vaccine.** / 1 to 2 doses if you smoke cigarettes or if you have certain conditions.  Meningococcal vaccine.** / Consult your health care provider.  Hepatitis A vaccine.** / Consult your health care provider.  Hepatitis B vaccine.** / Consult your health care provider.  Haemophilus influenzae type b (Hib) vaccine.** / Consult your health care provider. Ages 65   years and over  Blood pressure check.** / Every 1 to 2 years.  Lipid and cholesterol check.** / Every 5 years beginning at age 22 years.  Lung cancer screening. / Every year if you are aged 73-80 years and have a 30-pack-year history of smoking and currently smoke or have quit within the past 15 years. Yearly screening is stopped once you have quit smoking for at least 15 years or develop a health problem that would prevent you from having lung cancer  treatment.  Clinical breast exam.** / Every year after age 4 years.  BRCA-related cancer risk assessment.** / For women who have family members with a BRCA-related cancer (breast, ovarian, tubal, or peritoneal cancers).  Mammogram.** / Every year beginning at age 40 years and continuing for as long as you are in good health. Consult with your health care provider.  Pap test.** / Every 3 years starting at age 9 years through age 34 or 91 years with 3 consecutive normal Pap tests. Testing can be stopped between 65 and 70 years with 3 consecutive normal Pap tests and no abnormal Pap or HPV tests in the past 10 years.  HPV screening.** / Every 3 years from ages 57 years through ages 64 or 45 years with a history of 3 consecutive normal Pap tests. Testing can be stopped between 65 and 70 years with 3 consecutive normal Pap tests and no abnormal Pap or HPV tests in the past 10 years.  Fecal occult blood test (FOBT) of stool. / Every year beginning at age 15 years and continuing until age 17 years. You may not need to do this test if you get a colonoscopy every 10 years.  Flexible sigmoidoscopy or colonoscopy.** / Every 5 years for a flexible sigmoidoscopy or every 10 years for a colonoscopy beginning at age 86 years and continuing until age 71 years.  Hepatitis C blood test.** / For all people born from [birth year] through 1963 and any individual with known risks for hepatitis C.  Osteoporosis screening.** / A one-time screening for women ages 83 years and over and women at risk for fractures or osteoporosis.  Skin self-exam. / Monthly.  Influenza vaccine. / Every year.  Tetanus, diphtheria, and acellular pertussis (Tdap/Td) vaccine.** / 1 dose of Td every 10 years.  Varicella vaccine.** / Consult your health care provider.  Zoster vaccine.** / 1 dose for adults aged 61 years or older.  Pneumococcal 13-valent conjugate (PCV13) vaccine.** / Consult your health care provider.  Pneumococcal  polysaccharide (PPSV23) vaccine.** / 1 dose for all adults aged 28 years and older.  Meningococcal vaccine.** / Consult your health care provider.  Hepatitis A vaccine.** / Consult your health care provider.  Hepatitis B vaccine.** / Consult your health care provider.  Haemophilus influenzae type b (Hib) vaccine.** / Consult your health care provider. ** Family history and personal history of risk and conditions may change your health care provider's recommendations. Document Released: 12/07/2001 Document Revised: 02/25/2014 Document Reviewed: 03/08/2011 [facility] Patient Information 2015 [place], [place]. This information is not intended to replace advice given to you by your health care provider. Make sure you discuss any questions you have with your health care provider.

## 2015-02-24 NOTE — Progress Notes (Signed)
Pre visit review using our clinic review tool, if applicable. No additional management support is needed unless otherwise documented below in the visit note. 

## 2015-02-24 NOTE — Progress Notes (Signed)
Subjective:    Allison Bass is a 74 y.o. female who presents for Medicare Annual/Subsequent preventive examination.  Preventive Screening-Counseling & Management  Tobacco History  Smoking status  . Never Smoker   Smokeless tobacco  . Never Used     Problems Prior to Visit 1.none  Current Problems (verified) There are no active problems to display for this patient.   Medications Prior to Visit Current Outpatient Prescriptions on File Prior to Visit  Medication Sig Dispense Refill  . Na Sulfate-K Sulfate-Mg Sulf (SUPREP BOWEL PREP) SOLN Take 1 kit by mouth once. suprep as directed. No substitutions 354 mL 0   No current facility-administered medications on file prior to visit.    Current Medications (verified) Current Outpatient Prescriptions  Medication Sig Dispense Refill  . ALPRAZolam (XANAX) 0.5 MG tablet Take 1 tablet (0.5 mg total) by mouth 3 (three) times daily. 90 tablet 3  . desvenlafaxine (PRISTIQ) 100 MG 24 hr tablet Take 1 tablet (100 mg total) by mouth daily. 90 tablet 3  . DILANTIN 100 MG ER capsule Take 3 capsules (300 mg total) by mouth daily. 90 capsule 5  . hydrochlorothiazide (HYDRODIURIL) 50 MG tablet Take 1 tablet (50 mg total) by mouth daily. 30 tablet 5  . lisinopril (PRINIVIL,ZESTRIL) 10 MG tablet Take 1 tablet (10 mg total) by mouth daily. 90 tablet 3  . Na Sulfate-K Sulfate-Mg Sulf (SUPREP BOWEL PREP) SOLN Take 1 kit by mouth once. suprep as directed. No substitutions 354 mL 0  . potassium chloride (K-DUR) 10 MEQ tablet TAKE 2 TABLETS BY MOUTH EVERY MORNING AND TAKE 2 TABLETS BY MOUTH EVERY EVENING 120 tablet 5  . PREMARIN 1.25 MG tablet Take 1 tablet (1.25 mg total) by mouth daily. 90 tablet 3  . zoster vaccine live, PF, (ZOSTAVAX) 62376 UNT/0.65ML injection Inject 19,400 Units into the skin once. 1 vial 0   No current facility-administered medications for this visit.     Allergies (verified) Codeine   PAST HISTORY  Family  History Family History  Problem Relation Age of Onset  . Alcoholism Father   . Lung cancer Father     Smoker  . Stroke Father   . High blood pressure Father   . Breast cancer Maternal Grandmother   . Heart disease Mother   . High blood pressure Mother   . Depression Mother   . Seizures Mother   . Colon cancer Neg Hx   . Esophageal cancer Neg Hx   . Stomach cancer Neg Hx   . Rectal cancer Neg Hx     Social History History  Substance Use Topics  . Smoking status: Never Smoker   . Smokeless tobacco: Never Used  . Alcohol Use: Yes     Comment: Occ      Are there smokers in your home (other than you)? No  Risk Factors Current exercise habits: walking  Dietary issues discussed: no issures   Cardiac risk factors: advanced age (older than 31 for men, 25 for women), dyslipidemia and hypertension.  Depression Screen (Note: if answer to either of the following is "Yes", a more complete depression screening is indicated)   Over the past two weeks, have you felt down, depressed or hopeless? No  Over the past two weeks, have you felt little interest or pleasure in doing things? No  Have you lost interest or pleasure in daily life? No  Do you often feel hopeless? No  Do you cry easily over simple problems? No  Activities of  Daily Living In your present state of health, do you have any difficulty performing the following activities?:  Driving? No Managing money?  No Feeding yourself? No Getting from bed to chair? No Climbing a flight of stairs? No Preparing food and eating?: No Bathing or showering? No Getting dressed: No Getting to the toilet? No Using the toilet:No Moving around from place to place: No In the past year have you fallen or had a near fall?:No   Are you sexually active?  No  Do you have more than one partner?  No  Hearing Difficulties: No Do you often ask people to speak up or repeat themselves? No Do you experience ringing or noises in your ears?  No Do you have difficulty understanding soft or whispered voices? No   Do you feel that you have a problem with memory? No  Do you often misplace items? No  Do you feel safe at home?  Yes  Cognitive Testing  Alert? Yes  Normal Appearance?Yes  Oriented to person? Yes  Place? Yes   Time? Yes  Recall of three objects?  Yes  Can perform simple calculations? Yes  Displays appropriate judgment?Yes  Can read the correct time from a watch face?Yes   Advanced Directives have been discussed with the patient? Yes  List the Names of Other Physician/Practitioners you currently use: 1.    Indicate any recent Medical Services you may have received from other than [facility] providers in the past year (date may be approximate).  Immunization History  Administered Date(s) Administered  . Pneumococcal Conjugate-13 02/21/2014  . Pneumococcal-Unspecified 02/22/2004  . Tdap 10/25/2006    Screening Tests Health Maintenance  Topic Date Due  . ZOSTAVAX  09/05/2003  . PNA vac Low Risk Adult (2 of 2 - PPSV23) 02/22/2015  . COLONOSCOPY  03/10/2015 (Originally 09/04/1993)  . INFLUENZA VACCINE  05/26/2015  . MAMMOGRAM  03/20/2016  . TETANUS/TDAP  10/25/2016  . DEXA SCAN  Completed    All answers were reviewed with the patient and necessary referrals were made:  Garnet Koyanagi, DO   02/24/2015   History reviewed:  She  has a past medical history of Chicken pox; Depression; GERD (gastroesophageal reflux disease); Seasonal allergies; Migraine; Heart murmur; HTN (hypertension); Anxiety; and Epilepsy. She  does not have any pertinent problems on file. She  has past surgical history that includes Abdominal hysterectomy; Tonsillectomy and adenoidectomy; Cataract extraction (Bilateral); Lymphadenectomy; and Appendectomy. Her family history includes Alcoholism in her father; Breast cancer in her maternal grandmother; Depression in her mother; Heart disease in her mother; High blood pressure in her father and  mother; Lung cancer in her father; Seizures in her mother; Stroke in her father. There is no history of Colon cancer, Esophageal cancer, Stomach cancer, or Rectal cancer. She  reports that she has never smoked. She has never used smokeless tobacco. She reports that she drinks alcohol. She reports that she does not use illicit drugs. She has a current medication list which includes the following prescription(s): alprazolam, desvenlafaxine, dilantin, hydrochlorothiazide, lisinopril, na sulfate-k sulfate-mg sulf, potassium chloride, premarin, and zoster vaccine live (pf). Current Outpatient Prescriptions on File Prior to Visit  Medication Sig Dispense Refill  . Na Sulfate-K Sulfate-Mg Sulf (SUPREP BOWEL PREP) SOLN Take 1 kit by mouth once. suprep as directed. No substitutions 354 mL 0   No current facility-administered medications on file prior to visit.   She is allergic to codeine.  Review of Systems  Review of Systems  Constitutional: Negative for activity  change, appetite change and fatigue.  HENT: Negative for hearing loss, congestion, tinnitus and ear discharge.   Eyes: Negative for visual disturbance (see optho q1y -- vision corrected to 20/20 with glasses).  Respiratory: Negative for cough, chest tightness and shortness of breath.   Cardiovascular: Negative for chest pain, palpitations and leg swelling.  Gastrointestinal: Negative for abdominal pain, diarrhea, constipation and abdominal distention.  Genitourinary: Negative for urgency, frequency, decreased urine volume and difficulty urinating.  Musculoskeletal: Negative for back pain, arthralgias and gait problem.  Skin: Negative for color change, pallor and rash.  Neurological: Negative for dizziness, light-headedness, numbness and headaches.  Hematological: Negative for adenopathy. Does not bruise/bleed easily.  Psychiatric/Behavioral: Negative for suicidal ideas, confusion, sleep disturbance, self-injury, dysphoric mood, decreased  concentration and agitation.  Pt is able to read and write and can do all ADLs No risk for falling No abuse/ violence in home     Objective:     Vision by Snellen chart: opth Body mass index is 24.96 kg/(m^2). BP 138/79 mmHg  Pulse 71  Temp(Src) 98 F (36.7 C) (Oral)  Ht _0  (1.651 m)  Wt 150 lb (68.04 kg)  BMI 24.96 kg/m2  SpO2 98%  BP 138/79 mmHg  Pulse 71  Temp(Src) 98 F (36.7 C) (Oral)  Ht _1  (1.651 m)  Wt 150 lb (68.04 kg)  BMI 24.96 kg/m2  SpO2 98% General appearance: alert, cooperative, appears stated age and no distress Head: Normocephalic, without obvious abnormality, atraumatic Eyes: conjunctivae/corneas clear. PERRL, EOM's intact. Fundi benign. Ears: normal TM's and external ear canals both ears Nose: Nares normal. Septum midline. Mucosa normal. No drainage or sinus tenderness. Throat: lips, mucosa, and tongue normal; teeth and gums normal Neck: no adenopathy, no carotid bruit, no JVD, supple, symmetrical, trachea midline and thyroid not enlarged, symmetric, no tenderness/mass/nodules Back: symmetric, no curvature. ROM normal. No CVA tenderness. Lungs: clear to auscultation bilaterally Breasts: normal appearance, no masses or tenderness Heart: regular rate and rhythm, S1, S2 normal, no murmur, click, rub or gallop Abdomen: soft, non-tender; bowel sounds normal; no masses,  no organomegaly Pelvic: not indicated; status post hysterectomy, negative ROS Extremities: extremities normal, atraumatic, no cyanosis or edema Pulses: 2+ and symmetric Skin: Skin color, texture, turgor normal. No rashes or lesions Lymph nodes: Cervical, supraclavicular, and axillary nodes normal. Neurologic: Alert and oriented X 3, normal strength and tone. Normal symmetric reflexes. Normal coordination and gait Psych-- no depression, no anxiety      Assessment:     cpe      Plan:     During the course of the visit the patient was educated and counseled about appropriate  screening and preventive services including:    Pneumococcal vaccine   Influenza vaccine  Screening mammography  Bone densitometry screening  Colorectal cancer screening  Diabetes screening  Glaucoma screening  Advanced directives: has an advanced directive - a copy HAS NOT been provided.  Diet review for nutrition referral? Yes ____  Not Indicated __x_   Patient Instructions (the written plan) was given to the patient.  Medicare Attestation I have personally reviewed: The patient's medical and social history Their use of alcohol, tobacco or illicit drugs Their current medications and supplements The patient's functional ability including ADLs,fall risks, home safety risks, cognitive, and hearing and visual impairment Diet and physical activities Evidence for depression or mood disorders  The patient's weight, height, BMI, and visual acuity have been recorded in the chart.  I have made referrals, counseling, and provided education  to the patient based on review of the above and I have provided the patient with a written personalized care plan for preventive services.    1. Need for shingles vaccine  - zoster vaccine live, PF, (ZOSTAVAX) 73428 UNT/0.65ML injection; Inject 19,400 Units into the skin once.  Dispense: 1 vial; Refill: 0  2. Seizures  - DILANTIN 100 MG ER capsule; Take 3 capsules (300 mg total) by mouth daily.  Dispense: 90 capsule; Refill: 5  3. Anxiety disorder, unspecified anxiety disorder type  - ALPRAZolam (XANAX) 0.5 MG tablet; Take 1 tablet (0.5 mg total) by mouth 3 (three) times daily.  Dispense: 90 tablet; Refill: 3 - desvenlafaxine (PRISTIQ) 100 MG 24 hr tablet; Take 1 tablet (100 mg total) by mouth daily.  Dispense: 90 tablet; Refill: 3  4. Menopause - PREMARIN 1.25 MG tablet; Take 1 tablet (1.25 mg total) by mouth daily.  Dispense: 90 tablet; Refill: 3  5. Essential hypertension  - hydrochlorothiazide (HYDRODIURIL) 50 MG tablet; Take 1  tablet (50 mg total) by mouth daily.  Dispense: 30 tablet; Refill: 5 - lisinopril (PRINIVIL,ZESTRIL) 10 MG tablet; Take 1 tablet (10 mg total) by mouth daily.  Dispense: 90 tablet; Refill: 3 - potassium chloride (K-DUR) 10 MEQ tablet; TAKE 2 TABLETS BY MOUTH EVERY MORNING AND TAKE 2 TABLETS BY MOUTH EVERY EVENING  Dispense: 120 tablet; Refill: 5 - Basic metabolic panel - CBC with Differential/Platelet - Hepatic function panel - Lipid panel - POCT urinalysis dipstick  6. Routine history and physical examination of adult  7. 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 Renovascular hypertension    Garnet Koyanagi, DO   02/24/2015

## 2015-02-25 ENCOUNTER — Encounter: Payer: Self-pay | Admitting: Internal Medicine

## 2015-02-25 LAB — POCT URINALYSIS DIPSTICK
Bilirubin, UA: NEGATIVE
Blood, UA: NEGATIVE
GLUCOSE UA: NEGATIVE
Ketones, UA: NEGATIVE
LEUKOCYTES UA: NEGATIVE
NITRITE UA: NEGATIVE
Protein, UA: NEGATIVE
Spec Grav, UA: 1.02
Urobilinogen, UA: 4
pH, UA: 6

## 2015-02-25 LAB — LIPID PANEL
CHOL/HDL RATIO: 2
Cholesterol: 197 mg/dL (ref 0–200)
HDL: 91.7 mg/dL (ref >39.00)
LDL Cholesterol: 92 mg/dL (ref 0–99)
NONHDL: 105.3
Triglycerides: 68 mg/dL (ref 0.0–149.0)
VLDL: 13.6 mg/dL (ref 0.0–40.0)

## 2015-02-25 LAB — CBC WITH DIFFERENTIAL/PLATELET
BASOS PCT: 0.9 % (ref 0.0–3.0)
Basophils Absolute: 0.1 10*3/uL (ref 0.0–0.1)
Eosinophils Absolute: 0.7 10*3/uL (ref 0.0–0.7)
Eosinophils Relative: 10.9 % — ABNORMAL HIGH (ref 0.0–5.0)
HCT: 37.5 % (ref 36.0–46.0)
Hemoglobin: 12.8 g/dL (ref 12.0–15.0)
LYMPHS PCT: 33.8 % (ref 12.0–46.0)
Lymphs Abs: 2 10*3/uL (ref 0.7–4.0)
MCHC: 34.1 g/dL (ref 30.0–36.0)
MCV: 91 fl (ref 78.0–100.0)
MONO ABS: 0.6 10*3/uL (ref 0.1–1.0)
MONOS PCT: 9.4 % (ref 3.0–12.0)
NEUTROS PCT: 45 % (ref 43.0–77.0)
Neutro Abs: 2.7 10*3/uL (ref 1.4–7.7)
PLATELETS: 224 10*3/uL (ref 150.0–400.0)
RBC: 4.13 Mil/uL (ref 3.87–5.11)
RDW: 13.4 % (ref 11.5–15.5)
WBC: 6.1 10*3/uL (ref 4.0–10.5)

## 2015-02-25 LAB — BASIC METABOLIC PANEL
BUN: 12 mg/dL (ref 6–23)
CHLORIDE: 94 meq/L — AB (ref 96–112)
CO2: 29 meq/L (ref 19–32)
Calcium: 8.6 mg/dL (ref 8.4–10.5)
Creatinine, Ser: 0.88 mg/dL (ref 0.40–1.20)
GFR: 67.23 mL/min (ref >60.00)
Glucose, Bld: 79 mg/dL (ref 70–99)
Potassium: 4.3 mEq/L (ref 3.5–5.1)
SODIUM: 128 meq/L — AB (ref 135–145)

## 2015-02-25 LAB — HEPATIC FUNCTION PANEL
ALT: 8 U/L (ref 0–35)
AST: 20 U/L (ref 0–37)
Albumin: 3.4 g/dL — ABNORMAL LOW (ref 3.5–5.2)
Alkaline Phosphatase: 58 U/L (ref 39–117)
Bilirubin, Direct: 0.1 mg/dL (ref 0.0–0.3)
TOTAL PROTEIN: 6.8 g/dL (ref 6.0–8.3)
Total Bilirubin: 0.4 mg/dL (ref 0.2–1.2)

## 2015-02-26 ENCOUNTER — Encounter: Payer: Self-pay | Admitting: Internal Medicine

## 2015-02-26 ENCOUNTER — Ambulatory Visit (AMBULATORY_SURGERY_CENTER): Payer: Medicare Other | Admitting: Internal Medicine

## 2015-02-26 VITALS — BP 131/66 | HR 72 | Temp 98.3°F | Resp 14 | Ht 65.0 in | Wt 147.0 lb

## 2015-02-26 DIAGNOSIS — G40909 Epilepsy, unspecified, not intractable, without status epilepticus: Secondary | ICD-10-CM | POA: Diagnosis not present

## 2015-02-26 DIAGNOSIS — Z1211 Encounter for screening for malignant neoplasm of colon: Secondary | ICD-10-CM | POA: Diagnosis present

## 2015-02-26 DIAGNOSIS — Z886 Allergy status to analgesic agent status: Secondary | ICD-10-CM | POA: Diagnosis not present

## 2015-02-26 DIAGNOSIS — I10 Essential (primary) hypertension: Secondary | ICD-10-CM | POA: Diagnosis not present

## 2015-02-26 MED ORDER — SODIUM CHLORIDE 0.9 % IV SOLN: 500.0000 mL | INTRAVENOUS | Status: DC

## 2015-02-26 NOTE — Progress Notes (Signed)
Patient denies any allergies to eggs or soy. 

## 2015-02-26 NOTE — Progress Notes (Signed)
Report to PACU, RN, vss, BBS= Clear.  

## 2015-02-26 NOTE — Op Note (Signed)
[facility]  [address]. [place], [address]   COLONOSCOPY PROCEDURE REPORT  PATIENT: Allison Bass, Allison Bass  MR#: [id number] BIRTHDATE: Jun 24, 1941 , 73  yrs. old GENDER: female ENDOSCOPIST: Lafayette Dragon, MD REFERRED [id number] Fontana, DO PROCEDURE DATE:  02/26/2015 PROCEDURE:   Colonoscopy, screening First Screening Colonoscopy - Avg.  risk and is 50 yrs.  old or older Yes.  Prior Negative Screening - Now for repeat screening. N/A  History of Adenoma - Now for follow-up colonoscopy & has been > or = to 3 yrs.  N/A  Polyps Removed Today ASA CLASS:   Class II INDICATIONS:Colorectal Neoplasm Risk Assessment for this procedure is average risk. MEDICATIONS: Monitored anesthesia care and Propofol 300 mg IV  DESCRIPTION OF PROCEDURE:   After the risks benefits and alternatives of the procedure were thoroughly explained, informed consent was obtained.  The digital rectal exam revealed no abnormalities of the rectum.   The LB [id number]  endoscope was introduced through the anus and advanced to the cecum, which was identified by both the appendix and ileocecal valve. No adverse events experienced.   The quality of the prep was good.  (MoviPrep was used)  The instrument was then slowly withdrawn as the colon was fully examined.      COLON FINDINGS: A normal appearing cecum, ileocecal valve, and appendiceal orifice were identified.  The ascending, transverse, descending, sigmoid colon, and rectum appeared unremarkable. Retroflexed views revealed no abnormalities. The time to cecum = 7.59 Withdrawal time = 12.35   The scope was withdrawn and the procedure completed. COMPLICATIONS: There were no immediate complications.  ENDOSCOPIC IMPRESSION: Normal colonoscopy torturous colon  RECOMMENDATIONS: High fiber diet Recall colonoscopy in 10 years if patient in a good health  eSigned:  Lafayette Dragon, MD 02/26/2015 2:43 PM   cc:

## 2015-02-26 NOTE — Patient Instructions (Signed)
YOU HAD AN ENDOSCOPIC PROCEDURE TODAY AT [facility]:   Refer to the procedure report that was given to you for any specific questions about what was found during the examination.  If the procedure report does not answer your questions, please call your gastroenterologist to clarify.  If you requested that your care partner not be given the details of your procedure findings, then the procedure report has been included in a sealed envelope for you to review at your convenience later.  YOU SHOULD EXPECT: Some feelings of bloating in the abdomen. Passage of more gas than usual.  Walking can help get rid of the air that was put into your GI tract during the procedure and reduce the bloating. If you had a lower endoscopy (such as a colonoscopy or flexible sigmoidoscopy) you may notice spotting of blood in your stool or on the toilet paper. If you underwent a bowel prep for your procedure, you may not have a normal bowel movement for a few days.  Please Note:  You might notice some irritation and congestion in your nose or some drainage.  This is from the oxygen used during your procedure.  There is no need for concern and it should clear up in a day or so.  SYMPTOMS TO REPORT IMMEDIATELY:   Following lower endoscopy (colonoscopy or flexible sigmoidoscopy):  Excessive amounts of blood in the stool  Significant tenderness or worsening of abdominal pains  Swelling of the abdomen that is new, acute  Fever of 100F or higher    For urgent or emergent issues, a gastroenterologist can be reached at any hour by calling [phone].   DIET: Your first meal following the procedure should be a small meal and then it is ok to progress to your normal diet. Heavy or fried foods are harder to digest and may make you feel nauseous or bloated.  Likewise, meals heavy in dairy and vegetables can increase bloating.  Drink plenty of fluids but you should avoid alcoholic beverages for 24  hours.  ACTIVITY:  You should plan to take it easy for the rest of today and you should NOT DRIVE or use heavy machinery until tomorrow (because of the sedation medicines used during the test).    FOLLOW UP: Our staff will call the number listed on your records the next business day following your procedure to check on you and address any questions or concerns that you may have regarding the information given to you following your procedure. If we do not reach you, we will leave a message.  However, if you are feeling well and you are not experiencing any problems, there is no need to return our call.  We will assume that you have returned to your regular daily activities without incident.  If any biopsies were taken you will be contacted by phone or by letter within the next 1-3 weeks.  Please call us at [phone] if you have not heard about the biopsies in 3 weeks.    SIGNATURES/CONFIDENTIALITY: You and/or your care partner have signed paperwork which will be entered into your electronic medical record.  These signatures attest to the fact that that the information above on your After Visit Summary has been reviewed and is understood.  Full responsibility of the confidentiality of this discharge information lies with you and/or your care-partner.   HIGH FIBER DIET

## 2015-02-27 ENCOUNTER — Telehealth: Payer: Self-pay | Admitting: *Deleted

## 2015-02-27 NOTE — Telephone Encounter (Signed)
  Follow up Call-  Call back number 02/26/2015  Post procedure Call Back phone  # [phone]  Permission to leave phone message Yes     Patient questions:  Do you have a fever, pain , or abdominal swelling? No. Pain Score  0 *  Have you tolerated food without any problems? Yes.    Have you been able to return to your normal activities? Yes.    Do you have any questions about your discharge instructions: Diet   No. Medications  No. Follow up visit  No.  Do you have questions or concerns about your Care? No.  Actions: * If pain score is 4 or above: No action needed, pain <4.

## 2015-03-03 ENCOUNTER — Encounter: Payer: Self-pay | Admitting: Family Medicine

## 2015-03-04 ENCOUNTER — Encounter: Payer: Self-pay | Admitting: Family Medicine

## 2015-03-11 ENCOUNTER — Encounter: Payer: Self-pay | Admitting: Physician Assistant

## 2015-03-11 ENCOUNTER — Ambulatory Visit (INDEPENDENT_AMBULATORY_CARE_PROVIDER_SITE_OTHER): Payer: Medicare Other | Admitting: Physician Assistant

## 2015-03-11 VITALS — BP 116/60 | HR 70 | Temp 98.3°F | Ht 65.0 in | Wt 150.2 lb

## 2015-03-11 DIAGNOSIS — R569 Unspecified convulsions: Secondary | ICD-10-CM | POA: Diagnosis not present

## 2015-03-11 DIAGNOSIS — L237 Allergic contact dermatitis due to plants, except food: Secondary | ICD-10-CM

## 2015-03-11 MED ORDER — PREDNISONE 10 MG PO TABS: ORAL_TABLET | ORAL | Status: DC

## 2015-03-11 MED ORDER — TRIAMCINOLONE ACETONIDE 0.1 % EX CREA: 1.0000 "application " | TOPICAL_CREAM | Freq: Two times a day (BID) | CUTANEOUS | Status: DC

## 2015-03-11 MED ORDER — METHYLPREDNISOLONE ACETATE 40 MG/ML IJ SUSP
40.0000 mg | Freq: Once | INTRAMUSCULAR | Status: AC
  Administered 2015-03-11: 40 mg via INTRAMUSCULAR

## 2015-03-11 NOTE — Progress Notes (Signed)
Patient presents to clinic today c/o poison ivy rash since Saturday after pulling weeds in a wooded area that was being landscaping.  Endorses pruritic rash over upper extremities, trunk and neck. Has history of rhus dermatitis, stating if not treated, usually will get into her mouth and on face.   Past Medical History  Diagnosis Date  . Chicken pox   . Depression   . GERD (gastroesophageal reflux disease)   . Seasonal allergies   . Migraine   . Heart murmur   . HTN (hypertension)   . Anxiety   . 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 ALPRAZolam (XANAX) 0.5 MG tablet Take 1 tablet (0.5 mg total) by mouth 3 (three) times daily. 90 tablet 3  . desvenlafaxine (PRISTIQ) 100 MG 24 hr tablet Take 1 tablet (100 mg total) by mouth daily. 90 tablet 3  . DILANTIN 100 MG ER capsule Take 3 capsules (300 mg total) by mouth daily. 90 capsule 5  . hydrochlorothiazide (HYDRODIURIL) 50 MG tablet Take 1 tablet (50 mg total) by mouth daily. 30 tablet 5  . lisinopril (PRINIVIL,ZESTRIL) 10 MG tablet Take 1 tablet (10 mg total) by mouth daily. 90 tablet 3  . potassium chloride (K-DUR) 10 MEQ tablet TAKE 2 TABLETS BY MOUTH EVERY MORNING AND TAKE 2 TABLETS BY MOUTH EVERY EVENING 120 tablet 5  . PREMARIN 1.25 MG tablet Take 1 tablet (1.25 mg total) by mouth daily. 90 tablet 3  . zoster vaccine live, PF, (ZOSTAVAX) 47654 UNT/0.65ML injection Inject 19,400 Units into the skin once. (Patient not taking: Reported on 02/26/2015) 1 vial 0   No current facility-administered medications on file prior to visit.    Allergies  Allergen Reactions  . Codeine Rash    Makes the patient walk into walls    Family History  Problem Relation Age of Onset  . Alcoholism Father   . Lung cancer Father     Smoker  . Stroke Father   . High blood pressure Father   . Breast cancer Maternal Grandmother   . Heart  disease Mother   . High blood pressure Mother   . Depression Mother   . Seizures Mother   . Colon cancer Neg Hx   . Esophageal cancer Neg Hx   . Stomach cancer Neg Hx   . Rectal cancer Neg Hx     History   Social History  . Marital Status: Widowed    Spouse Name: N/A  . Number of Children: N/A  . Years of Education: N/A   Occupational History  .      retired   Social History Main Topics  . Smoking status: Never Smoker   . Smokeless tobacco: Never Used  . Alcohol Use: Yes     Comment: Occ  . Drug Use: No  . Sexual Activity: Not on file   Other Topics Concern  . None   Social History Narrative   Review of Systems - See HPI.  All other ROS are negative.  BP 116/60 mmHg  Pulse 70  Temp(Src) 98.3 F (36.8 C) (Oral)  Ht 5\' 5"  (1.651 m)  Wt 150 lb 3.2 oz (68.13 kg)  BMI 24.99 kg/m2  SpO2 97%  Physical Exam  Constitutional: She is oriented to person, place, and time and well-developed, well-nourished, and in no distress.  Cardiovascular: Normal rate, regular rhythm, normal heart  sounds and intact distal pulses.   Pulmonary/Chest: Effort normal and breath sounds normal. No respiratory distress. She has no wheezes. She has no rales. She exhibits no tenderness.  Neurological: She is alert and oriented to person, place, and time.  Skin: Skin is warm and dry.     Psychiatric: Affect normal.  Vitals reviewed.   Recent Results (from the past 2160 hour(s))  Basic metabolic panel     Status: Abnormal   Collection Time: 02/24/15  4:15 PM  Result Value Ref Range   Sodium 128 (L) 135 - 145 mEq/L   Potassium 4.3 3.5 - 5.1 mEq/L   Chloride 94 (L) 96 - 112 mEq/L   CO2 29 19 - 32 mEq/L   Glucose, Bld 79 70 - 99 mg/dL   BUN 12 6 - 23 mg/dL   Creatinine, Ser 0.88 0.40 - 1.20 mg/dL   Calcium 8.6 8.4 - 10.5 mg/dL   GFR 67.23 >60.00 mL/min  CBC with Differential/Platelet     Status: Abnormal   Collection Time: 02/24/15  4:15 PM  Result Value Ref Range   WBC 6.1 4.0 - 10.5  K/uL   RBC 4.13 3.87 - 5.11 Mil/uL   Hemoglobin 12.8 12.0 - 15.0 g/dL   HCT 37.5 36.0 - 46.0 %   MCV 91.0 78.0 - 100.0 fl   MCHC 34.1 30.0 - 36.0 g/dL   RDW 13.4 11.5 - 15.5 %   Platelets 224.0 150.0 - 400.0 K/uL   Neutrophils Relative % 45.0 43.0 - 77.0 %   Lymphocytes Relative 33.8 12.0 - 46.0 %   Monocytes Relative 9.4 3.0 - 12.0 %   Eosinophils Relative 10.9 (H) 0.0 - 5.0 %   Basophils Relative 0.9 0.0 - 3.0 %   Neutro Abs 2.7 1.4 - 7.7 K/uL   Lymphs Abs 2.0 0.7 - 4.0 K/uL   Monocytes Absolute 0.6 0.1 - 1.0 K/uL   Eosinophils Absolute 0.7 0.0 - 0.7 K/uL   Basophils Absolute 0.1 0.0 - 0.1 K/uL  Hepatic function panel     Status: Abnormal   Collection Time: 02/24/15  4:15 PM  Result Value Ref Range   Total Bilirubin 0.4 0.2 - 1.2 mg/dL   Bilirubin, Direct 0.1 0.0 - 0.3 mg/dL   Alkaline Phosphatase 58 39 - 117 U/L   AST 20 0 - 37 U/L   ALT 8 0 - 35 U/L   Total Protein 6.8 6.0 - 8.3 g/dL   Albumin 3.4 (L) 3.5 - 5.2 g/dL  Lipid panel     Status: None   Collection Time: 02/24/15  4:15 PM  Result Value Ref Range   Cholesterol 197 0 - 200 mg/dL    Comment: ATP III Classification       Desirable:  < 200 mg/dL               Borderline High:  200 - 239 mg/dL          High:  > = 240 mg/dL   Triglycerides 68.0 0.0 - 149.0 mg/dL    Comment: Normal:  <150 mg/dLBorderline High:  150 - 199 mg/dL   HDL 91.70 >39.00 mg/dL   VLDL 13.6 0.0 - 40.0 mg/dL   LDL Cholesterol 92 0 - 99 mg/dL   Total CHOL/HDL Ratio 2     Comment:                Men          Women1/2 Average Risk  3.4          3.3Average Risk          5.0          4.42X Average Risk          9.6          7.13X Average Risk          15.0          11.0                       NonHDL 105.30     Comment: NOTE:  Non-HDL goal should be 30 mg/dL higher than patient's LDL goal (i.e. LDL goal of < 70 mg/dL, would have non-HDL goal of < 100 mg/dL)  POCT urinalysis dipstick     Status: None   Collection Time: 02/25/15  9:50 AM  Result Value  Ref Range   Color, UA yellow    Clarity, UA clear    Glucose, UA Neg    Bilirubin, UA Neg    Ketones, UA Neg    Spec Grav, UA 1.020    Blood, UA Neg    pH, UA 6.0    Protein, UA Neg    Urobilinogen, UA 4.0    Nitrite, UA Neg    Leukocytes, UA Negative     Assessment/Plan: Poison ivy dermatitis Extensive case involving face and lips. 40 IM Depomedrol given by nursing staff. Will begin prednisone taper tomorrow as follows: 40 mg x 3 days, 30 mg x 3 days, 20 mg x 3 days, 10 mg x 3 days. Rx kenalog topical to use as directed if needed.  Cool compresses and Benadryl as directed.

## 2015-03-11 NOTE — Patient Instructions (Signed)
Please keep skin clean and dry. Start the steroid taper tomorrow as directed. Use the cream up to twice daily as needed for itch.  Do not use on face or groin. Cool compresses and Benadryl will also help with itch. Call or return if symptoms are not improving.

## 2015-03-11 NOTE — Addendum Note (Signed)
Addended by: Harl Bowie on: 03/11/2015 04:31 PM   Modules accepted: Orders

## 2015-03-11 NOTE — Addendum Note (Signed)
Addended by: Modena Morrow D on: 03/11/2015 02:34 PM   Modules accepted: Orders

## 2015-03-11 NOTE — Progress Notes (Signed)
Pre visit review using our clinic review tool, if applicable. No additional management support is needed unless otherwise documented below in the visit note. 

## 2015-03-11 NOTE — Assessment & Plan Note (Signed)
Extensive case involving face and lips. 40 IM Depomedrol given by nursing staff. Will begin prednisone taper tomorrow as follows: 40 mg x 3 days, 30 mg x 3 days, 20 mg x 3 days, 10 mg x 3 days. Rx kenalog topical to use as directed if needed.  Cool compresses and Benadryl as directed.

## 2015-03-12 LAB — PHENYTOIN LEVEL, TOTAL: PHENYTOIN LVL: 15.8 ug/mL (ref 10.0–20.0)

## 2015-03-13 ENCOUNTER — Encounter: Payer: Self-pay | Admitting: Family Medicine

## 2015-03-14 ENCOUNTER — Ambulatory Visit: Payer: PRIVATE HEALTH INSURANCE | Admitting: Physician Assistant

## 2015-03-14 ENCOUNTER — Encounter: Payer: Self-pay | Admitting: Family Medicine

## 2015-03-14 NOTE — Telephone Encounter (Signed)
She can come in and we can give her higher dose

## 2015-03-17 ENCOUNTER — Ambulatory Visit (INDEPENDENT_AMBULATORY_CARE_PROVIDER_SITE_OTHER): Payer: Medicare Other | Admitting: Physician Assistant

## 2015-03-17 ENCOUNTER — Encounter: Payer: Self-pay | Admitting: Physician Assistant

## 2015-03-17 VITALS — BP 117/42 | HR 79 | Temp 98.3°F | Ht 65.0 in | Wt 151.0 lb

## 2015-03-17 DIAGNOSIS — L237 Allergic contact dermatitis due to plants, except food: Secondary | ICD-10-CM | POA: Diagnosis not present

## 2015-03-17 MED ORDER — PREDNISONE 10 MG PO TABS: ORAL_TABLET | ORAL | Status: DC

## 2015-03-17 MED ORDER — METHYLPREDNISOLONE ACETATE 80 MG/ML IJ SUSP
80.0000 mg | Freq: Once | INTRAMUSCULAR | Status: AC
  Administered 2015-03-17: 80 mg via INTRAMUSCULAR

## 2015-03-17 NOTE — Assessment & Plan Note (Signed)
IM depo medrol booster given. Will titrate prednisone back up to 40 mg x 2 days then resume tapering down following instructions.  Supportive measures discussed.  Follow-up if not resolving.

## 2015-03-17 NOTE — Patient Instructions (Signed)
Please finish current prescription of prednisone as follows go back up to 40 mg for 2 days, then decreased to 30 x 3 days, 20 mg x 3 days then finish with 10 mg x 3 days. The booster shot given today should help things quite a bit.  Apply some witch hazel astringent to the problematic area (except for pelvic region) to help dry up lesions quicker.  Follow-up if not resolving.

## 2015-03-17 NOTE — Progress Notes (Signed)
Pre visit review using our clinic review tool, if applicable. No additional management support is needed unless otherwise documented below in the visit note. 

## 2015-03-17 NOTE — Progress Notes (Signed)
Patient presents to clinic today for follow-up of rhus dermatitis.  Patient seen on 03/11/15 and started on Prednisone taper and clobetasol cream.  Patient was also given steroid shot in office due to location of lesions near mouth.  Patient endorses original lesions are mostly dried up but now her lower extremities and groin region.  Denies fever, chills.  Denies known repeat exposure.   Past Medical History  Diagnosis Date  . Chicken pox   . Depression   . GERD (gastroesophageal reflux disease)   . Seasonal allergies   . Migraine   . Heart murmur   . HTN (hypertension)   . Anxiety   . 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 ALPRAZolam (XANAX) 0.5 MG tablet Take 1 tablet (0.5 mg total) by mouth 3 (three) times daily. 90 tablet 3  . desvenlafaxine (PRISTIQ) 100 MG 24 hr tablet Take 1 tablet (100 mg total) by mouth daily. 90 tablet 3  . DILANTIN 100 MG ER capsule Take 3 capsules (300 mg total) by mouth daily. 90 capsule 5  . hydrochlorothiazide (HYDRODIURIL) 50 MG tablet Take 1 tablet (50 mg total) by mouth daily. 30 tablet 5  . lisinopril (PRINIVIL,ZESTRIL) 10 MG tablet Take 1 tablet (10 mg total) by mouth daily. 90 tablet 3  . potassium chloride (K-DUR) 10 MEQ tablet TAKE 2 TABLETS BY MOUTH EVERY MORNING AND TAKE 2 TABLETS BY MOUTH EVERY EVENING 120 tablet 5  . PREMARIN 1.25 MG tablet Take 1 tablet (1.25 mg total) by mouth daily. 90 tablet 3  . triamcinolone cream (KENALOG) 0.1 % Apply 1 application topically 2 (two) times daily. 30 g 0  . zoster vaccine live, PF, (ZOSTAVAX) 75643 UNT/0.65ML injection Inject 19,400 Units into the skin once. 1 vial 0   No current facility-administered medications on file prior to visit.    Allergies  Allergen Reactions  . Codeine Rash    Makes the patient walk into walls    Family History  Problem Relation Age of Onset  . Alcoholism  Father   . Lung cancer Father     Smoker  . Stroke Father   . High blood pressure Father   . Breast cancer Maternal Grandmother   . Heart disease Mother   . High blood pressure Mother   . Depression Mother   . Seizures Mother   . Colon cancer Neg Hx   . Esophageal cancer Neg Hx   . Stomach cancer Neg Hx   . Rectal cancer Neg Hx     History   Social History  . Marital Status: Widowed    Spouse Name: N/A  . Number of Children: N/A  . Years of Education: N/A   Occupational History  .      retired   Social History Main Topics  . Smoking status: Never Smoker   . Smokeless tobacco: Never Used  . Alcohol Use: Yes     Comment: Occ  . Drug Use: No  . Sexual Activity: Not on file   Other Topics Concern  . None   Social History Narrative   Review of Systems - See HPI.  All other ROS are negative.  BP 117/42 mmHg  Pulse 79  Temp(Src) 98.3 F (36.8 C) (Oral)  Ht 5\' 5"  (1.651 m)  Wt 151 lb (68.493 kg)  BMI 25.13 kg/m2  SpO2 98%  Physical Exam  Constitutional: She is oriented to person, place, and time and well-developed, well-nourished, and in no distress.  HENT:  Head: Normocephalic and atraumatic.  Eyes: Conjunctivae are normal. Pupils are equal, round, and reactive to light.  Neck: Neck supple.  Cardiovascular: Normal rate, regular rhythm, normal heart sounds and intact distal pulses.   Pulmonary/Chest: Effort normal and breath sounds normal. No respiratory distress. She has no wheezes. She has no rales. She exhibits no tenderness.  Lymphadenopathy:    She has no cervical adenopathy.  Neurological: She is alert and oriented to person, place, and time.  Skin: Skin is warm and dry.  Psychiatric: Affect normal.  Vitals reviewed.  Recent Results (from the past 2160 hour(s))  Basic metabolic panel     Status: Abnormal   Collection Time: 02/24/15  4:15 PM  Result Value Ref Range   Sodium 128 (L) 135 - 145 mEq/L   Potassium 4.3 3.5 - 5.1 mEq/L   Chloride 94 (L)  96 - 112 mEq/L   CO2 29 19 - 32 mEq/L   Glucose, Bld 79 70 - 99 mg/dL   BUN 12 6 - 23 mg/dL   Creatinine, Ser 0.88 0.40 - 1.20 mg/dL   Calcium 8.6 8.4 - 10.5 mg/dL   GFR 67.23 >60.00 mL/min  CBC with Differential/Platelet     Status: Abnormal   Collection Time: 02/24/15  4:15 PM  Result Value Ref Range   WBC 6.1 4.0 - 10.5 K/uL   RBC 4.13 3.87 - 5.11 Mil/uL   Hemoglobin 12.8 12.0 - 15.0 g/dL   HCT 37.5 36.0 - 46.0 %   MCV 91.0 78.0 - 100.0 fl   MCHC 34.1 30.0 - 36.0 g/dL   RDW 13.4 11.5 - 15.5 %   Platelets 224.0 150.0 - 400.0 K/uL   Neutrophils Relative % 45.0 43.0 - 77.0 %   Lymphocytes Relative 33.8 12.0 - 46.0 %   Monocytes Relative 9.4 3.0 - 12.0 %   Eosinophils Relative 10.9 (H) 0.0 - 5.0 %   Basophils Relative 0.9 0.0 - 3.0 %   Neutro Abs 2.7 1.4 - 7.7 K/uL   Lymphs Abs 2.0 0.7 - 4.0 K/uL   Monocytes Absolute 0.6 0.1 - 1.0 K/uL   Eosinophils Absolute 0.7 0.0 - 0.7 K/uL   Basophils Absolute 0.1 0.0 - 0.1 K/uL  Hepatic function panel     Status: Abnormal   Collection Time: 02/24/15  4:15 PM  Result Value Ref Range   Total Bilirubin 0.4 0.2 - 1.2 mg/dL   Bilirubin, Direct 0.1 0.0 - 0.3 mg/dL   Alkaline Phosphatase 58 39 - 117 U/L   AST 20 0 - 37 U/L   ALT 8 0 - 35 U/L   Total Protein 6.8 6.0 - 8.3 g/dL   Albumin 3.4 (L) 3.5 - 5.2 g/dL  Lipid panel     Status: None   Collection Time: 02/24/15  4:15 PM  Result Value Ref Range   Cholesterol 197 0 - 200 mg/dL    Comment: ATP III Classification       Desirable:  < 200 mg/dL               Borderline High:  200 - 239 mg/dL          High:  > = 240 mg/dL   Triglycerides 68.0 0.0 - 149.0 mg/dL    Comment: Normal:  <150 mg/dLBorderline High:  150 - 199 mg/dL   HDL 91.70 >39.00 mg/dL   VLDL 13.6 0.0 -  40.0 mg/dL   LDL Cholesterol 92 0 - 99 mg/dL   Total CHOL/HDL Ratio 2     Comment:                Men          Women1/2 Average Risk     3.4          3.3Average Risk          5.0          4.42X Average Risk          9.6           7.13X Average Risk          15.0          11.0                       NonHDL 105.30     Comment: NOTE:  Non-HDL goal should be 30 mg/dL higher than patient's LDL goal (i.e. LDL goal of < 70 mg/dL, would have non-HDL goal of < 100 mg/dL)  POCT urinalysis dipstick     Status: None   Collection Time: 02/25/15  9:50 AM  Result Value Ref Range   Color, UA yellow    Clarity, UA clear    Glucose, UA Neg    Bilirubin, UA Neg    Ketones, UA Neg    Spec Grav, UA 1.020    Blood, UA Neg    pH, UA 6.0    Protein, UA Neg    Urobilinogen, UA 4.0    Nitrite, UA Neg    Leukocytes, UA Negative   Dilantin (Phenytoin) level, total     Status: None   Collection Time: 03/11/15  2:34 PM  Result Value Ref Range   Phenytoin Lvl 15.8 10.0 - 20.0 ug/mL    Assessment/Plan: Poison ivy dermatitis IM depo medrol booster given. Will titrate prednisone back up to 40 mg x 2 days then resume tapering down following instructions.  Supportive measures discussed.  Follow-up if not resolving.

## 2015-04-05 ENCOUNTER — Encounter: Payer: Self-pay | Admitting: Physician Assistant

## 2015-04-06 ENCOUNTER — Encounter: Payer: Self-pay | Admitting: Physician Assistant

## 2015-05-13 ENCOUNTER — Encounter: Payer: Self-pay | Admitting: Physician Assistant

## 2015-06-07 ENCOUNTER — Encounter: Payer: Self-pay | Admitting: Family Medicine

## 2015-06-19 ENCOUNTER — Encounter: Payer: Self-pay | Admitting: Family Medicine

## 2015-06-19 DIAGNOSIS — Z23 Encounter for immunization: Secondary | ICD-10-CM

## 2015-06-19 MED ORDER — ZOSTER VACCINE LIVE 19400 UNT/0.65ML ~~LOC~~ SOLR: 0.6500 mL | Freq: Once | SUBCUTANEOUS | Status: DC

## 2015-06-20 ENCOUNTER — Encounter: Payer: Self-pay | Admitting: Family Medicine

## 2015-07-01 ENCOUNTER — Other Ambulatory Visit: Payer: Self-pay | Admitting: Family Medicine

## 2015-07-02 NOTE — Telephone Encounter (Signed)
Last seen and filled 02/24/15 #90 with 3 refills. No UDS   Please advise      KP

## 2015-07-02 NOTE — Telephone Encounter (Signed)
Rx phoned in to the pharmacy.     KP

## 2015-08-03 ENCOUNTER — Other Ambulatory Visit: Payer: Self-pay | Admitting: Family Medicine

## 2015-08-04 NOTE — Telephone Encounter (Signed)
Last seen 02/24/15 and filled 07/02/15 #90 No UDS on file and and no contract.   Please advise     KP

## 2015-08-06 ENCOUNTER — Other Ambulatory Visit: Payer: Self-pay | Admitting: Family Medicine

## 2015-09-09 ENCOUNTER — Telehealth: Payer: Self-pay | Admitting: Family Medicine

## 2015-09-09 NOTE — Telephone Encounter (Signed)
Last seen 02/24/15 and filled 08/04/15 #90  Please advise     KP

## 2015-09-10 ENCOUNTER — Encounter: Payer: Self-pay | Admitting: Family Medicine

## 2015-09-11 ENCOUNTER — Encounter: Payer: Self-pay | Admitting: Family Medicine

## 2015-09-11 NOTE — Telephone Encounter (Signed)
Touched base with insurance and PA is still processing in clinical review. JG//CMA

## 2015-09-11 NOTE — Telephone Encounter (Signed)
I called the pharmacy and the Xanax need's a PA, Allison Bass has initiated it. I called the patient to make her aware and she verbalized understanding, she wanted to be self pay. I called [facility] and they had the Rx thew cheapest at $26.07. The patient was ok with that and the Rx has been called in.       KP

## 2015-09-11 NOTE — Telephone Encounter (Signed)
It has not been denied, I called the pharmacy. She will needs a PA and it is in progress per [place].      KP

## 2015-09-11 NOTE — Telephone Encounter (Signed)
[facility] called about the alprazolam and said the RX didn't come thru escribe and asked to be faxed

## 2015-09-12 NOTE — Telephone Encounter (Signed)
Approved.  

## 2015-09-17 DIAGNOSIS — S39012A Strain of muscle, fascia and tendon of lower back, initial encounter: Secondary | ICD-10-CM | POA: Diagnosis not present

## 2015-09-27 ENCOUNTER — Encounter: Payer: Self-pay | Admitting: Family Medicine

## 2015-10-06 DIAGNOSIS — H26493 Other secondary cataract, bilateral: Secondary | ICD-10-CM | POA: Diagnosis not present

## 2015-10-06 DIAGNOSIS — H5203 Hypermetropia, bilateral: Secondary | ICD-10-CM | POA: Diagnosis not present

## 2015-10-06 DIAGNOSIS — H353131 Nonexudative age-related macular degeneration, bilateral, early dry stage: Secondary | ICD-10-CM | POA: Diagnosis not present

## 2015-10-07 ENCOUNTER — Other Ambulatory Visit: Payer: Self-pay | Admitting: Family Medicine

## 2015-10-08 ENCOUNTER — Telehealth: Payer: Self-pay | Admitting: Family Medicine

## 2015-10-08 DIAGNOSIS — Z780 Asymptomatic menopausal state: Secondary | ICD-10-CM

## 2015-10-08 DIAGNOSIS — I10 Essential (primary) hypertension: Secondary | ICD-10-CM

## 2015-10-08 DIAGNOSIS — F419 Anxiety disorder, unspecified: Secondary | ICD-10-CM

## 2015-10-08 NOTE — Telephone Encounter (Signed)
Relation to [id number] Call back number:[phone] Pharmacy:   [facility] [phone] (Phone) [phone] (Fax)        Patient requesting a refill of the following medication and would like the other pharmacy deleted out the system; DILANTIN 100 MG ER capsule  PREMARIN 1.25 MG tablet  ALPRAZolam (XANAX) 0.5 MG tablet  desvenlafaxine (PRISTIQ) 100 MG 24 hr tablet  lisinopril (PRINIVIL,ZESTRIL) 10 MG tablet  hydrochlorothiazide (HYDRODIURIL) 50 MG tablet  potassium chloride (K-DUR,KLOR-CON) 10 MEQ tablet

## 2015-10-09 ENCOUNTER — Encounter: Payer: Self-pay | Admitting: Family Medicine

## 2015-10-09 ENCOUNTER — Other Ambulatory Visit: Payer: Self-pay | Admitting: Family Medicine

## 2015-10-09 MED ORDER — POTASSIUM CHLORIDE CRYS ER 10 MEQ PO TBCR: EXTENDED_RELEASE_TABLET | ORAL | Status: DC

## 2015-10-09 MED ORDER — DILANTIN 100 MG PO CAPS: 300.0000 mg | ORAL_CAPSULE | Freq: Every day | ORAL | Status: DC

## 2015-10-09 MED ORDER — LISINOPRIL 10 MG PO TABS: 10.0000 mg | ORAL_TABLET | Freq: Every day | ORAL | Status: DC

## 2015-10-09 MED ORDER — DESVENLAFAXINE SUCCINATE ER 100 MG PO TB24: 100.0000 mg | ORAL_TABLET | Freq: Every day | ORAL | Status: DC

## 2015-10-09 MED ORDER — HYDROCHLOROTHIAZIDE 50 MG PO TABS: ORAL_TABLET | ORAL | Status: DC

## 2015-10-09 MED ORDER — PREMARIN 1.25 MG PO TABS: 1.2500 mg | ORAL_TABLET | Freq: Every day | ORAL | Status: DC

## 2015-10-09 MED ORDER — ALPRAZOLAM 0.5 MG PO TABS: 0.5000 mg | ORAL_TABLET | Freq: Three times a day (TID) | ORAL | Status: DC

## 2015-10-09 NOTE — Telephone Encounter (Signed)
All med's faxed to [facility]

## 2015-11-04 DIAGNOSIS — H02839 Dermatochalasis of unspecified eye, unspecified eyelid: Secondary | ICD-10-CM | POA: Diagnosis not present

## 2015-11-04 DIAGNOSIS — H26492 Other secondary cataract, left eye: Secondary | ICD-10-CM | POA: Diagnosis not present

## 2015-11-04 DIAGNOSIS — I10 Essential (primary) hypertension: Secondary | ICD-10-CM | POA: Diagnosis not present

## 2015-11-04 DIAGNOSIS — Z961 Presence of intraocular lens: Secondary | ICD-10-CM | POA: Diagnosis not present

## 2015-11-14 ENCOUNTER — Other Ambulatory Visit: Payer: Self-pay | Admitting: Family Medicine

## 2015-11-14 NOTE — Telephone Encounter (Signed)
Last seen 02/23/25 and filled 10/09/15 #90   Please advise     KP

## 2015-11-25 DIAGNOSIS — H26491 Other secondary cataract, right eye: Secondary | ICD-10-CM | POA: Diagnosis not present

## 2015-12-06 ENCOUNTER — Other Ambulatory Visit: Payer: Self-pay | Admitting: Family Medicine

## 2015-12-08 NOTE — Telephone Encounter (Signed)
Last seen 02/24/15 and filled 11/14/15 #90 no rf   Please advise    KP

## 2016-01-06 ENCOUNTER — Other Ambulatory Visit: Payer: Self-pay | Admitting: Family Medicine

## 2016-01-06 NOTE — Telephone Encounter (Signed)
Last seen 02/24/15 and filled 12/08/15 #90   Please advise     KP

## 2016-01-07 NOTE — Telephone Encounter (Signed)
Rx Faxed    KP 

## 2016-01-09 ENCOUNTER — Other Ambulatory Visit: Payer: Self-pay | Admitting: Family Medicine

## 2016-01-09 ENCOUNTER — Encounter: Payer: Self-pay | Admitting: Family Medicine

## 2016-01-12 ENCOUNTER — Other Ambulatory Visit: Payer: Self-pay | Admitting: Family Medicine

## 2016-01-13 ENCOUNTER — Encounter: Payer: Self-pay | Admitting: Family Medicine

## 2016-01-14 ENCOUNTER — Telehealth: Payer: Self-pay | Admitting: *Deleted

## 2016-01-14 ENCOUNTER — Telehealth: Payer: Self-pay | Admitting: Family Medicine

## 2016-01-14 ENCOUNTER — Ambulatory Visit: Payer: Self-pay | Admitting: Physician Assistant

## 2016-01-14 NOTE — Telephone Encounter (Signed)
Per verbal from PA, Hassell Done spoke with pt and advised her of need to r/s appt re: her confusion due to the lab not being open this evening and will not be able to obtain labs tonight. 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 Has had no other confusion, denies slurred speech, facial drooping or extremity weakness. Pt notes that she missed her dilantin dose this morning. R/S appt for tomorrow at 11am with Dr Lorelei Pont. Pt advised of symptoms to prompt ER evaluation.

## 2016-01-14 NOTE — Telephone Encounter (Signed)
Patient Name: Allison Bass  DOB: 10-02-1941    Initial Comment Caller states yesterday morning started to drive to do her normal routine, and her mind went blank and she forgot what she was suppose to do. Happened again this morning. States once someone explains to her what she's suppose to do then she's okay. Didn't take meds this morning, but did take them yesterday. Wants labs done.   Nurse Assessment  Nurse: Thad Ranger RN, Denise Date/Time (Eastern Time): 01/14/2016 3:46:29 PM  Confirm and document reason for call. If symptomatic, describe symptoms. You must click the next button to save text entered. ---Caller states yesterday morning started to drive to do her normal routine, and her mind went blank and she forgot what she was suppose to do. Happened again this morning. States once someone explains to her what she's suppose to do then she's okay. Didn't take meds this morning, but did take them yesterday. Wants labs done. States the episodes of confusion happen in the am only. States she feels fine now. Takes Dilantin for sz disorder and wants Dilantin level checked.  Has the patient traveled out of the country within the last 30 days? ---Not Applicable  Does the patient have any new or worsening symptoms? ---Yes  Will a triage be completed? ---Yes  Related visit to physician within the last 2 weeks? ---No  Does the PT have any chronic conditions? (i.e. diabetes, asthma, etc.) ---Yes  List chronic conditions. ---Sz disorder, HTN  Is this a behavioral health or substance abuse call? ---No     Guidelines    Guideline Title Affirmed Question Affirmed Notes  Confusion - Delirium [1] Acting confused (e.g., disoriented, slurred speech) AND [2] brief (now gone)    Final Disposition User   See Physician within 4 Hours (or PCP triage) Thad Ranger, RN, Langley Gauss    Comments  Advised appt made at 6:00p and 6:15p for a 30 min appt slot. Advised to have another adult drive her to the [facility] and take a list of  all of her meds that she is taking both Rx and OTC. Verb understanding.   Referrals  REFERRED TO PCP OFFICE   Disagree/Comply: Comply

## 2016-01-15 ENCOUNTER — Encounter: Payer: Self-pay | Admitting: Family Medicine

## 2016-01-15 ENCOUNTER — Ambulatory Visit (INDEPENDENT_AMBULATORY_CARE_PROVIDER_SITE_OTHER): Payer: Medicare Other | Admitting: Family Medicine

## 2016-01-15 VITALS — BP 110/65 | HR 77 | Temp 98.8°F | Ht 65.0 in | Wt 154.4 lb

## 2016-01-15 DIAGNOSIS — G40909 Epilepsy, unspecified, not intractable, without status epilepticus: Secondary | ICD-10-CM | POA: Diagnosis not present

## 2016-01-15 DIAGNOSIS — R413 Other amnesia: Secondary | ICD-10-CM

## 2016-01-15 DIAGNOSIS — E871 Hypo-osmolality and hyponatremia: Secondary | ICD-10-CM | POA: Diagnosis not present

## 2016-01-15 LAB — PHENYTOIN LEVEL, TOTAL: PHENYTOIN LVL: 15.3 ug/mL (ref 10.0–20.0)

## 2016-01-15 LAB — TSH: TSH: 2.27 u[IU]/mL (ref 0.35–4.50)

## 2016-01-15 LAB — CBC WITH DIFFERENTIAL/PLATELET
Basophils Absolute: 0 10*3/uL (ref 0.0–0.1)
Basophils Relative: 0.6 % (ref 0.0–3.0)
EOS ABS: 0.1 10*3/uL (ref 0.0–0.7)
EOS PCT: 1 % (ref 0.0–5.0)
HEMATOCRIT: 38.3 % (ref 36.0–46.0)
HEMOGLOBIN: 12.9 g/dL (ref 12.0–15.0)
Lymphocytes Relative: 24.8 % (ref 12.0–46.0)
Lymphs Abs: 1.6 10*3/uL (ref 0.7–4.0)
MCHC: 33.6 g/dL (ref 30.0–36.0)
MCV: 90.6 fl (ref 78.0–100.0)
MONO ABS: 0.5 10*3/uL (ref 0.1–1.0)
Monocytes Relative: 7.5 % (ref 3.0–12.0)
Neutro Abs: 4.3 10*3/uL (ref 1.4–7.7)
Neutrophils Relative %: 66.1 % (ref 43.0–77.0)
Platelets: 271 10*3/uL (ref 150.0–400.0)
RBC: 4.23 Mil/uL (ref 3.87–5.11)
RDW: 11.8 % (ref 11.5–15.5)
WBC: 6.5 10*3/uL (ref 4.0–10.5)

## 2016-01-15 LAB — COMPREHENSIVE METABOLIC PANEL
ALBUMIN: 3.6 g/dL (ref 3.5–5.2)
ALK PHOS: 51 U/L (ref 39–117)
ALT: 8 U/L (ref 0–35)
AST: 22 U/L (ref 0–37)
BILIRUBIN TOTAL: 0.5 mg/dL (ref 0.2–1.2)
BUN: 13 mg/dL (ref 6–23)
CALCIUM: 9 mg/dL (ref 8.4–10.5)
CHLORIDE: 92 meq/L — AB (ref 96–112)
CO2: 30 mEq/L (ref 19–32)
CREATININE: 0.86 mg/dL (ref 0.40–1.20)
GFR: 68.87 mL/min (ref >60.00)
Glucose, Bld: 120 mg/dL — ABNORMAL HIGH (ref 70–99)
Potassium: 3.5 mEq/L (ref 3.5–5.1)
SODIUM: 130 meq/L — AB (ref 135–145)
TOTAL PROTEIN: 6.9 g/dL (ref 6.0–8.3)

## 2016-01-15 NOTE — Patient Instructions (Signed)
It was very nice to see you today- I will be in touch with your labs asap If you have any further episodes with memory loss please do let us know!   If you would like, it may be helpful to try and gradually decrease your xanax use.  However you do need to slowly taper your dose and avoid stopping abruptly! Perhaps decrease ONE of your daily doses to a 1/2 tablet.  You can decrease one dose in this fashion every 2-3 weeks

## 2016-01-15 NOTE — Progress Notes (Signed)
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, [place] [address] [phone]  Date:  01/15/2016   Name:  Allison Bass   DOB:  02-Nov-1940   MRN:  [id number]  PCP:  Garnet Koyanagi, DO    Chief Complaint: Altered Mental Status   History of Present Illness:  Allison Bass is a 75 y.o. very pleasant female patient who presents with the following:  History of epilepsy, renovascular HTN.  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  A couple of days ago she went to the first school and just forgot where she goes next.  She called a friend who reminded her and she was back on track.  Then yesterday she woke up and could not remember where she was supposed to start.   Again a friend helped her and she was able to complete the route without difficulty.    The day prior to the original episode she had taken 3 of her xanax  She has been following this food pick up route for the last 6 months or so.   The day prior to the first episode she was upset because her cat ran away. She took her normal TID xanax and then took 2 more prior to bed so she could sleep.    Never had any episode like this in the past Last seizure in 1995 She has not had any other memory concerns. Her husband did pass away about 10 years ago so she now lives alone. However close friends have not noted any memory concerns  No slurred speech, no facial drooping, no weakness or numbness in any part of her body No med changes besides  No alcohol, no OTC sleep aids or cold meds used.    She did take her dilantin today She does not see a neurologist any longer-   BP Readings from Last 3 Encounters:  01/15/16 99/58  03/17/15 117/42  03/11/15 116/60     Patient Active Problem List   Diagnosis Date Noted  . Poison ivy dermatitis 03/11/2015   . 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 Epilepsy ([place]) 02/24/2015    Past Medical History  Diagnosis Date  . Chicken pox   . Depression   . GERD (gastroesophageal reflux disease)   . Seasonal allergies   . Migraine   . Heart murmur   . HTN (hypertension)   . Anxiety   . Epilepsy ([place])     controlled by Dilantin- last seizure 1995    Past Surgical History  Procedure Laterality Date  . Abdominal hysterectomy    . Tonsillectomy and adenoidectomy    . Cataract extraction Bilateral   . Lymphadenectomy      Benign, age 43  . Appendectomy      with hysterectomy    Social History  Substance Use Topics  . Smoking status: Never Smoker   . Smokeless tobacco: Never Used  . Alcohol Use: Yes     Comment: Occ    Family History  Problem Relation Age of Onset  . Alcoholism Father   . Lung cancer Father     Smoker  . Stroke Father   . High blood pressure Father   . Breast cancer Maternal Grandmother   . Heart disease Mother   . High blood pressure Mother   .  Depression Mother   . Seizures Mother   . Colon cancer Neg Hx   . Esophageal cancer Neg Hx   . Stomach cancer Neg Hx   . Rectal cancer Neg Hx     Allergies  Allergen Reactions  . Codeine Rash    Makes the patient walk into walls    Medication list has been reviewed and updated.  Current Outpatient Prescriptions on File Prior to Visit  Medication Sig Dispense Refill  . ALPRAZolam (XANAX) 0.5 MG tablet TAKE ONE TABLET BY MOUTH THREE TIMES DAILY 90 tablet 0  . desvenlafaxine (PRISTIQ) 100 MG 24 hr tablet Take 1 tablet (100 mg total) by mouth daily. 90 tablet 3  . DILANTIN 100 MG ER capsule Take 3 capsules (300 mg total) by mouth daily. 270 capsule 3  . hydrochlorothiazide (HYDRODIURIL) 50 MG tablet TAKE 1 TABLET(50 MG) BY MOUTH DAILY 90 tablet 1  . lisinopril (PRINIVIL,ZESTRIL) 10 MG tablet Take 1 tablet (10 mg total) by mouth daily. 90 tablet 3  . potassium chloride (K-DUR,KLOR-CON) 10 MEQ tablet TAKE 2  TABLETS BY MOUTH EVERY MORNING AND EVERY EVENING 360 tablet 1  . PREMARIN 1.25 MG tablet Take 1 tablet (1.25 mg total) by mouth daily. 90 tablet 3   No current facility-administered medications on file prior to visit.    Review of Systems:  As per HPI- otherwise negative.   Physical Examination: Filed Vitals:   01/15/16 1105  BP: 99/58  Pulse: 77  Temp: 98.8 F (37.1 C)   Filed Vitals:   01/15/16 1105  Height: 5\' 5"  (1.651 m)  Weight: 154 lb 6.4 oz (70.035 kg)   Body mass index is 25.69 kg/(m^2). Ideal Body Weight: Weight in (lb) to have BMI = 25: 149.9  GEN: WDWN, NAD, Non-toxic, A & O x 3, looks well and much younger than age 3: Atraumatic, Normocephalic. Neck supple. No masses, No LAD.  Bilateral TM wnl, oropharynx normal.  PEERL,EOMI.   Ears and Nose: No external deformity. CV: RRR, No M/G/R. No JVD. No thrill. No extra heart sounds. PULM: CTA B, no wheezes, crackles, rhonchi. No retractions. No resp. distress. No accessory muscle use. ABD: S, NT, ND, +BS. No rebound. No HSM. EXTR: No c/c/e NEURO Normal gait. Normal strength, sensation and DTR of all extremities, normal strength and sensation of face, negative romberg PSYCH: Normally interactive. Conversant. Not depressed or anxious appearing.  Calm demeanor.    Assessment and Plan: Memory change - Plan: Dilantin (Phenytoin) level, total, CBC with Differential/Platelet, Comprehensive metabolic panel, TSH  Seizure disorder ([place]) - Plan: Dilantin (Phenytoin) level, total, TSH   Here today with concern about 2 episodes of confusion/ forgetting that she noted yesterday and the day before. Otherwise she has not had any concerns about her mental state.  Offered neurology consultation.  For the time being she prefers to check labs and observe.  She has had hyponatremia in the past so this could be the cause.  Also suspect that sx may be due to taking more xanax than she is used to. She would like to try and taper down on  her xanax use and we discussed how to do this gradually and safely Will plan further follow- up pending labs.   Signed Lamar Blinks, MD

## 2016-01-16 ENCOUNTER — Telehealth: Payer: Self-pay | Admitting: Family Medicine

## 2016-01-16 ENCOUNTER — Encounter: Payer: Self-pay | Admitting: Family Medicine

## 2016-01-16 NOTE — Addendum Note (Signed)
Addended by: Lamar Blinks C on: 01/16/2016 01:10 PM   Modules accepted: Orders

## 2016-01-16 NOTE — Telephone Encounter (Signed)
Entered by Darreld Mclean, MD at 01/16/2016 1:09 PM    Your labs are normal except your sodium level is a bit low. I suspect that this is due to your diuretic use (the HCTZ blood pressure med).   BP Readings from Last 3 Encounters:  01/15/16 : 110/65  03/17/15 : 117/42  03/11/15 : 116/60  As your blood pressure is well controlled, decrease this to a 1/2 tablet and then please come and see [place] for a repeat metabolic profile in 2-3 weeks. I don't think that your slightly low sodium would be enough to cause any confusion but it is possible. I will order the blood test for you- please make a lab appointment at your convenience    Lab results reviewed with patient.  Pt stated understanding and agreed with plan.  She was very appreciative for the call.  States she will call back to schedule lab appt.

## 2016-01-16 NOTE — Telephone Encounter (Signed)
Relation to [id number] Call back number: [phone]   Reason for call:  Patient inquiring about lab results, patient last seen by Dr. Lorelei Pont

## 2016-01-19 ENCOUNTER — Encounter: Payer: Self-pay | Admitting: Family Medicine

## 2016-01-19 DIAGNOSIS — R413 Other amnesia: Secondary | ICD-10-CM

## 2016-01-25 ENCOUNTER — Encounter: Payer: Self-pay | Admitting: Family Medicine

## 2016-01-26 ENCOUNTER — Encounter: Payer: Self-pay | Admitting: Neurology

## 2016-01-26 ENCOUNTER — Encounter: Payer: Self-pay | Admitting: Family Medicine

## 2016-01-26 ENCOUNTER — Ambulatory Visit (INDEPENDENT_AMBULATORY_CARE_PROVIDER_SITE_OTHER): Payer: Medicare Other | Admitting: Neurology

## 2016-01-26 VITALS — BP 120/74 | HR 83 | Ht 65.0 in | Wt 154.0 lb

## 2016-01-26 DIAGNOSIS — R55 Syncope and collapse: Secondary | ICD-10-CM

## 2016-01-26 DIAGNOSIS — R404 Transient alteration of awareness: Secondary | ICD-10-CM | POA: Diagnosis not present

## 2016-01-26 DIAGNOSIS — M5440 Lumbago with sciatica, unspecified side: Secondary | ICD-10-CM

## 2016-01-26 MED ORDER — CYCLOBENZAPRINE HCL 5 MG PO TABS: 5.0000 mg | ORAL_TABLET | Freq: Three times a day (TID) | ORAL | Status: DC | PRN

## 2016-01-26 NOTE — Patient Instructions (Signed)
1. Schedule open MRI brain with and without contrast 2. Schedule 1-hour sleep-deprived EEG 3. Contact your Cardiologist to update them on the episode of loss of consciousness 4. Re-establish care with your therapist for depression 5. May take Flexeril 5mg  as needed for back pain. Monitor for drowsiness. 6. Continue all your medications 7. Follow-up after tests

## 2016-01-26 NOTE — Progress Notes (Signed)
NEUROLOGY CONSULTATION NOTE  [place]hattan Constancio MRN: [id number] DOB: 07-Sep-1941  Referring provider: Dr. Garnet Koyanagi Primary care provider: Dr. Garnet Koyanagi  Reason for consult:  Syncope, confusion, history of seizures  Dear Dr Etter Sjogren:  Thank you for your kind referral of Allison Bass for consultation of the above symptoms. Although her history is well known to you, please allow me to reiterate it for the purpose of our medical record. Records and images were personally reviewed where available.  HISTORY OF PRESENT ILLNESS: This is a pleasant 75 year old right-handed woman with a history of hypertension, depression, anxiety, and seizures since her late 1s. The first seizure occurred in the early 1990s, she recalls feeling funny, "almost like I was going under surgery," then waking up in the hospital. Her husband had witnessed a generalized convulsion. She was started on Dilantin. She had been seizure-free for a while and a trial of Dilantin taper was done perhaps in 1995, during which she had a couple of breakthrough seizures within a week of stopping medication. She denies any further seizures since restarting the Dilantin in 2995. She denies any further episodes of the funny feeling, no  olfactory/gustatory hallucinations, deja vu,focal numbness/tingling/weakness, myoclonic jerks.  She had been doing well until 2 weeks ago, when on 01/12/16 she woke up and knew there was something she was supposed to do but could not recall it. She called her friend she needed to pick up food for [employer] Whe[employer], and after the reminder, she did not have any other issues that day. The next morning she got up and went to pick up food, then she could not think of where her next destination was. She had to call someone to remind her where to go, then she was fine. There is note that the day prior to the first episode she was upset because her cat ran away. She took her normal TID Xanax and then took 2  more prior to bed to help with sleep.  On 01/18/16, she was at church then had an intense feeling with nausea. The music was coming off the page, she got extremely hot and could hear things from a distance. She started to sit down and recalls her friend saying she could not lift her. She apparently fainted and woke up spread out on several chairs. She did not have the same "funny feeling" she had in the 1990s. No convulsive activity reported. She denied any sleep deprivation or alcohol the night prior.   She denies any headaches, dizziness, diplopia, dysarthria, dysphagia, neck pain, bowel/bladder dysfunction. She has migraines every 6 months or so. She has been having worsening back pain this week. No falls. She endorses some depression, reporting it is a "bit of a dark time the past month." She can't pinpoint one thing, but wakes up feeling alone and sad. No family history of memory changes. Her mother had seizures. Otherwise she had a normal birth and early development.  There is no history of febrile convulsions, CNS infections such as meningitis/encephalitis, significant traumatic brain injury, neurosurgical procedures, or family history of seizures.  Laboratory Data:  Lab Results  Component Value Date   WBC 6.5 01/15/2016   HGB 12.9 01/15/2016   HCT 38.3 01/15/2016   MCV 90.6 01/15/2016   PLT 271.0 01/15/2016     Chemistry      Component Value Date/Time   NA 130* 01/15/2016 1141   K 3.5 01/15/2016 1141   CL 92* 01/15/2016 1141  CO2 30 01/15/2016 1141   BUN 13 01/15/2016 1141   CREATININE 0.86 01/15/2016 1141      Component Value Date/Time   CALCIUM 9.0 01/15/2016 1141   ALKPHOS 51 01/15/2016 1141   AST 22 01/15/2016 1141   ALT 8 01/15/2016 1141   BILITOT 0.5 01/15/2016 1141      PAST MEDICAL HISTORY: Past Medical History  Diagnosis Date  . Chicken pox   . Depression   . GERD (gastroesophageal reflux disease)   . Seasonal allergies   . Migraine   . Heart murmur   . HTN  (hypertension)   . Anxiety   . Epilepsy ([place])     controlled by Dilantin- last seizure 1995    PAST SURGICAL HISTORY: Past Surgical History  Procedure Laterality Date  . Abdominal hysterectomy    . Tonsillectomy and adenoidectomy    . Cataract extraction Bilateral   . Lymphadenectomy      Benign, age 43  . Appendectomy      with hysterectomy    MEDICATIONS: Current Outpatient Prescriptions on File Prior to Visit  Medication Sig Dispense Refill  . ALPRAZolam (XANAX) 0.5 MG tablet TAKE ONE TABLET BY MOUTH THREE TIMES DAILY (Patient taking differently: TAKE ONE TABLET EVERY MORNING & AFTERNOON. TAKE 1/2 TABLET EVERY EVENING) 90 tablet 0  . desvenlafaxine (PRISTIQ) 100 MG 24 hr tablet Take 1 tablet (100 mg total) by mouth daily. 90 tablet 3  . DILANTIN 100 MG ER capsule Take 3 capsules (300 mg total) by mouth daily. 270 capsule 3  . hydrochlorothiazide (HYDRODIURIL) 50 MG tablet TAKE 1 TABLET(50 MG) BY MOUTH DAILY (Patient taking differently: Take 1/2 tablet daily) 90 tablet 1  . lisinopril (PRINIVIL,ZESTRIL) 10 MG tablet Take 1 tablet (10 mg total) by mouth daily. 90 tablet 3  . potassium chloride (K-DUR,KLOR-CON) 10 MEQ tablet TAKE 2 TABLETS BY MOUTH EVERY MORNING AND EVERY EVENING (Patient taking differently: TAKE 2 TABLETS BY MOUTH EVERY MORNING AND 2 TABLETS EVERY EVENING) 360 tablet 1  . PREMARIN 1.25 MG tablet Take 1 tablet (1.25 mg total) by mouth daily. 90 tablet 3   No current facility-administered medications on file prior to visit.    ALLERGIES: Allergies  Allergen Reactions  . Codeine Rash    Makes the patient walk into walls    FAMILY HISTORY: Family History  Problem Relation Age of Onset  . Alcoholism Father   . Lung cancer Father     Smoker  . Stroke Father   . High blood pressure Father   . Breast cancer Maternal Grandmother   . Heart disease Mother   . High blood pressure Mother   . Depression Mother   . Seizures Mother   . Colon cancer Neg Hx   .  Esophageal cancer Neg Hx   . Stomach cancer Neg Hx   . Rectal cancer Neg Hx     SOCIAL HISTORY: Social History   Social History  . Marital Status: Widowed    Spouse Name: N/A  . Number of Children: 3  . Years of Education: N/A   Occupational History  . Retired     retired   Social History Main Topics  . Smoking status: Never Smoker   . Smokeless tobacco: Never Used  . Alcohol Use: 0.0 oz/week    0 Standard drinks or equivalent per week     Comment: Occ  . Drug Use: No  . Sexual Activity: Not on file   Other Topics Concern  . Not  on file   Social History Narrative    REVIEW OF SYSTEMS: Constitutional: No fevers, chills, or sweats, no generalized fatigue, change in appetite Eyes: No visual changes, double vision, eye pain Ear, nose and throat: No hearing loss, ear pain, nasal congestion, sore throat Cardiovascular: No chest pain, palpitations Respiratory:  No shortness of breath at rest or with exertion, wheezes GastrointestinaI: No nausea, vomiting, diarrhea, abdominal pain, fecal incontinence Genitourinary:  No dysuria, urinary retention or frequency Musculoskeletal:  No neck pain, +back pain Integumentary: No rash, pruritus, skin lesions Neurological: as above Psychiatric: No depression, insomnia, anxiety Endocrine: No palpitations, fatigue, diaphoresis, mood swings, change in appetite, change in weight, increased thirst Hematologic/Lymphatic:  No anemia, purpura, petechiae. Allergic/Immunologic: no itchy/runny eyes, nasal congestion, recent allergic reactions, rashes  PHYSICAL EXAM: Filed Vitals:   01/26/16 1033  BP: 120/74  Pulse: 83   General: No acute distress Head:  Normocephalic/atraumatic Eyes: Fundoscopic exam shows bilateral sharp discs, no vessel changes, exudates, or hemorrhages Neck: supple, no paraspinal tenderness, full range of motion Back: No paraspinal tenderness Heart: regular rate and rhythm Lungs: Clear to auscultation  bilaterally. Vascular: No carotid bruits. Skin/Extremities: No rash, no edema Neurological Exam: Mental status: alert and oriented to person, place, and time, no dysarthria or aphasia, Fund of knowledge is appropriate.  Recent and remote memory are intact.  Attention and concentration are normal.    Able to name objects and repeat phrases. CDT 5/5 MMSE - Mini Mental State Exam 01/26/2016  Orientation to time 5  Orientation to Place 5  Registration 3  Attention/ Calculation 5  Recall 3  Language- name 2 objects 2  Language- repeat 1  Language- follow 3 step command 3  Language- read & follow direction 1  Write a sentence 1  Copy design 1  Total score 30   Cranial nerves: CN I: not tested CN II: pupils equal, round and reactive to light, visual fields intact, fundi unremarkable. CN III, IV, VI:  full range of motion, no nystagmus, no ptosis CN V: facial sensation intact CN VII: upper and lower face symmetric CN VIII: hearing intact to finger rub CN IX, X: gag intact, uvula midline CN XI: sternocleidomastoid and trapezius muscles intact CN XII: tongue midline Bulk & Tone: normal, no fasciculations. Motor: 5/5 throughout with no pronator drift. Sensation: intact to light touch, cold, pin, vibration and joint position sense.  No extinction to double simultaneous stimulation.  Romberg test negative Deep Tendon Reflexes: +3 throughout with +Hoffman sign on the right, no ankle clonus Plantar responses: downgoing bilaterally Cerebellar: no incoordination on finger to nose, heel to shin. No dysdiadochokinesia Gait: narrow-based and steady, able to tandem walk adequately. Tremor: none  IMPRESSION: This is a pleasant 75 year old right-handed woman with a history of seizures since her 37s suggestive of focal to bilateral tonic-clonic seizures. She has had breakthrough convulsions with attempts at Dilantin taper in the past. Last GTC was in 1995. She presents today due to 2 episodes of memory  loss and an episode of loss of consciousness last 01/18/16. Her neurological exam is non-focal except for brisk reflexes and +Hoffman sign on the right. MRI brain with and without contrast and 1-hour sleep-deprived EEG will be ordered. The episodes are not clearly typical for seizures, the episode of loss of consciousness is more suggestive of vasovagal syncope. She was advised to contact her cardiologist as well about the syncopal episode. The episodes of memory loss could potentially be related to Xanax intake, she has  reduced this slowly, but also may relate to psychological cause, she endorses more depression and was advised to re-establish care with her therapist. She reports back pain for the past week and will be given a prescription for Flexeril. Side effects were discussed. [place] driving laws were discussed with the patient, and she knows to stop driving after an episode of loss of consciousness until 6 months event-free. She will follow-up after the tests.  Thank you for allowing me to participate in the care of this patient. Please do not hesitate to call for any questions or concerns.   Ellouise Newer, M.D.  CC: Dr. Carollee Herter

## 2016-01-27 ENCOUNTER — Other Ambulatory Visit: Payer: Self-pay | Admitting: Neurology

## 2016-01-27 ENCOUNTER — Encounter: Payer: Self-pay | Admitting: Neurology

## 2016-01-27 DIAGNOSIS — R404 Transient alteration of awareness: Secondary | ICD-10-CM | POA: Insufficient documentation

## 2016-01-27 DIAGNOSIS — M545 Low back pain, unspecified: Secondary | ICD-10-CM | POA: Insufficient documentation

## 2016-01-27 DIAGNOSIS — R55 Syncope and collapse: Secondary | ICD-10-CM | POA: Insufficient documentation

## 2016-01-27 DIAGNOSIS — M5440 Lumbago with sciatica, unspecified side: Secondary | ICD-10-CM

## 2016-01-27 MED ORDER — TIZANIDINE HCL 4 MG PO TABS: 4.0000 mg | ORAL_TABLET | Freq: Three times a day (TID) | ORAL | Status: DC | PRN

## 2016-01-29 ENCOUNTER — Ambulatory Visit
Admission: RE | Admit: 2016-01-29 | Discharge: 2016-01-29 | Disposition: A | Discharge status: Home / Self Care | Payer: Medicare Other | Source: Ambulatory Visit | Attending: Neurology | Admitting: Neurology

## 2016-01-29 ENCOUNTER — Encounter: Payer: Self-pay | Admitting: Neurology

## 2016-01-29 DIAGNOSIS — I614 Nontraumatic intracerebral hemorrhage in cerebellum: Secondary | ICD-10-CM | POA: Diagnosis not present

## 2016-01-29 MED ORDER — GADOBENATE DIMEGLUMINE 529 MG/ML IV SOLN
13.0000 mL | Freq: Once | INTRAVENOUS | Status: AC | PRN
  Administered 2016-01-29: 13 mL via INTRAVENOUS

## 2016-01-30 ENCOUNTER — Telehealth: Payer: Self-pay | Admitting: Neurology

## 2016-01-30 ENCOUNTER — Encounter: Payer: Self-pay | Admitting: Family Medicine

## 2016-01-30 ENCOUNTER — Encounter: Payer: Self-pay | Admitting: Neurology

## 2016-01-30 DIAGNOSIS — R011 Cardiac murmur, unspecified: Secondary | ICD-10-CM

## 2016-01-30 DIAGNOSIS — I63119 Cerebral infarction due to embolism of unspecified vertebral artery: Secondary | ICD-10-CM

## 2016-01-30 NOTE — Telephone Encounter (Signed)
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 Abran Richard), as well as lipid panel. Would hold off on aspirin for now due to bleed in posterior circulation, would wait a few more weeks. She may need TEE. Discussed that if any worsening symptoms, go to ER immediately. Patient expressed understanding. Her main complaint is the back pain, advised to call her PCP.  Tiff, can you pls send order for echo and carotid dopplers with her cards office at Dr. Hubbard Robinson office and let them know she is his patient. Thanks

## 2016-01-30 NOTE — Telephone Encounter (Signed)
Orders faxed to [facility]/Dr. Hubbard Robinson office. They will call patient to schedule tests.  Ph: [phone]   Fax: [phone]

## 2016-02-01 ENCOUNTER — Encounter: Payer: Self-pay | Admitting: Family Medicine

## 2016-02-02 ENCOUNTER — Encounter: Payer: Self-pay | Admitting: Neurology

## 2016-02-05 ENCOUNTER — Other Ambulatory Visit: Payer: PRIVATE HEALTH INSURANCE

## 2016-02-05 DIAGNOSIS — R011 Cardiac murmur, unspecified: Secondary | ICD-10-CM | POA: Diagnosis not present

## 2016-02-09 ENCOUNTER — Ambulatory Visit: Payer: PRIVATE HEALTH INSURANCE | Admitting: Neurology

## 2016-02-10 ENCOUNTER — Encounter: Payer: Self-pay | Admitting: Neurology

## 2016-02-10 DIAGNOSIS — I639 Cerebral infarction, unspecified: Secondary | ICD-10-CM | POA: Diagnosis not present

## 2016-02-14 ENCOUNTER — Other Ambulatory Visit: Payer: Self-pay | Admitting: Family Medicine

## 2016-02-16 NOTE — Telephone Encounter (Signed)
Last seen 02/24/15 and filled 01/06/16 #90   Please advise    KP

## 2016-02-17 ENCOUNTER — Ambulatory Visit: Payer: PRIVATE HEALTH INSURANCE | Admitting: Neurology

## 2016-02-18 ENCOUNTER — Encounter: Payer: Self-pay | Admitting: Neurology

## 2016-02-18 ENCOUNTER — Ambulatory Visit: Payer: PRIVATE HEALTH INSURANCE | Admitting: Neurology

## 2016-02-19 ENCOUNTER — Telehealth: Payer: Self-pay

## 2016-02-19 ENCOUNTER — Ambulatory Visit (INDEPENDENT_AMBULATORY_CARE_PROVIDER_SITE_OTHER): Payer: Medicare Other | Admitting: Neurology

## 2016-02-19 ENCOUNTER — Encounter: Payer: Self-pay | Admitting: Neurology

## 2016-02-19 VITALS — BP 140/70 | HR 81 | Ht 65.0 in | Wt 158.0 lb

## 2016-02-19 DIAGNOSIS — I10 Essential (primary) hypertension: Secondary | ICD-10-CM | POA: Diagnosis not present

## 2016-02-19 DIAGNOSIS — I63112 Cerebral infarction due to embolism of left vertebral artery: Secondary | ICD-10-CM

## 2016-02-19 DIAGNOSIS — R739 Hyperglycemia, unspecified: Secondary | ICD-10-CM | POA: Diagnosis not present

## 2016-02-19 NOTE — Telephone Encounter (Signed)
Pt would like to know if it is okay if she has a Research scientist (life sciences) during her beach vacation. Please advise.

## 2016-02-19 NOTE — Patient Instructions (Addendum)
1.  We will hold off on starting aspirin for now.  In 3 months we will repeat CT of head and will likely start aspirin after review. 2.  We will check fasting lipid panel and Hgb A1c 3.  We will check MRA of head and neck. 4.  Continue blood pressure control 5.  Mediterranean diet    Why follow it? Research shows. . Those who follow the Mediterranean diet have a reduced risk of heart disease  . The diet is associated with a reduced incidence of Parkinson's and Alzheimer's diseases . People following the diet may have longer life expectancies and lower rates of chronic diseases  . The Dietary Guidelines for Americans recommends the Mediterranean diet as an eating plan to promote health and prevent disease  What Is the Mediterranean Diet?  . Healthy eating plan based on typical foods and recipes of Mediterranean-style cooking . The diet is primarily a plant based diet; these foods should make up a majority of meals   Starches - Plant based foods should make up a majority of meals - They are an important sources of vitamins, minerals, energy, antioxidants, and fiber - Choose whole grains, foods high in fiber and minimally processed items  - Typical grain sources include wheat, oats, barley, corn, brown rice, bulgar, farro, millet, polenta, couscous  - Various types of beans include chickpeas, lentils, fava beans, black beans, white beans   Fruits  Veggies - Large quantities of antioxidant rich fruits & veggies; 6 or more servings  - Vegetables can be eaten raw or lightly drizzled with oil and cooked  - Vegetables common to the traditional Mediterranean Diet include: artichokes, arugula, beets, broccoli, brussel sprouts, cabbage, carrots, celery, collard greens, cucumbers, eggplant, kale, leeks, lemons, lettuce, mushrooms, okra, onions, peas, peppers, potatoes, pumpkin, radishes, rutabaga, shallots, spinach, sweet potatoes, turnips, zucchini - Fruits common to the Mediterranean Diet include:  apples, apricots, avocados, cherries, clementines, dates, figs, grapefruits, grapes, melons, nectarines, oranges, peaches, pears, pomegranates, strawberries, tangerines  Fats - Replace butter and margarine with healthy oils, such as olive oil, canola oil, and tahini  - Limit nuts to no more than a handful a day  - Nuts include walnuts, almonds, pecans, pistachios, pine nuts  - Limit or avoid candied, honey roasted or heavily salted nuts - Olives are central to the [employer] - can be eaten whole or used in a variety of dishes   Meats Protein - Limiting red meat: no more than a few times a month - When eating red meat: choose lean cuts and keep the portion to the size of deck of cards - Eggs: approx. 0 to 4 times a week  - Fish and lean poultry: at least 2 a week  - Healthy protein sources include, chicken, [place], lean beef, lamb - Increase intake of seafood such as tuna, salmon, trout, mackerel, shrimp, scallops - Avoid or limit high fat processed meats such as sausage and bacon  Dairy - Include moderate amounts of low fat dairy products  - Focus on healthy dairy such as fat free yogurt, skim milk, low or reduced fat cheese - Limit dairy products higher in fat such as whole or 2% milk, cheese, ice cream  Alcohol - Moderate amounts of red wine is ok  - No more than 5 oz daily for women (all ages) and men older than age 32  - No more than 10 oz of wine daily for men younger than 82  Other - Limit sweets and  other desserts  - Use herbs and spices instead of salt to flavor foods  - Herbs and spices common to the traditional Mediterranean Diet include: basil, bay leaves, chives, cloves, cumin, fennel, garlic, lavender, marjoram, mint, oregano, parsley, pepper, rosemary, sage, savory, sumac, tarragon, thyme   It's not just a diet, it's a lifestyle:  . The Mediterranean diet includes lifestyle factors typical of those in the region  . Foods, drinks and meals are best eaten with others and  savored . Daily physical activity is important for overall good health . This could be strenuous exercise like running and aerobics . This could also be more leisurely activities such as walking, housework, yard-work, or taking the stairs . Moderation is the key; a balanced and healthy diet accommodates most foods and drinks . Consider portion sizes and frequency of consumption of certain foods   Meal Ideas & Options:  . Breakfast:  o Whole wheat toast or whole wheat English muffins with peanut butter & hard boiled egg o Steel cut oats topped with apples & cinnamon and skim milk  o Fresh fruit: banana, strawberries, melon, berries, peaches  o Smoothies: strawberries, bananas, greek yogurt, peanut butter o Low fat greek yogurt with blueberries and granola  o Egg white omelet with spinach and mushrooms o Breakfast couscous: whole wheat couscous, apricots, skim milk, cranberries  . Sandwiches:  o Hummus and grilled vegetables (peppers, zucchini, squash) on whole wheat bread   o Grilled chicken on whole wheat pita with lettuce, tomatoes, cucumbers or tzatziki  o Tuna salad on whole wheat bread: tuna salad made with greek yogurt, olives, red peppers, capers, green onions o Garlic rosemary lamb pita: lamb sauted with garlic, rosemary, salt & pepper; add lettuce, cucumber, greek yogurt to pita - flavor with lemon juice and black pepper  . Seafood:  o Mediterranean grilled salmon, seasoned with garlic, basil, parsley, lemon juice and black pepper o Shrimp, lemon, and spinach whole-grain pasta salad made with low fat greek yogurt  o Seared scallops with lemon orzo  o Seared tuna steaks seasoned salt, pepper, coriander topped with tomato mixture of olives, tomatoes, olive oil, minced garlic, parsley, green onions and cappers  . Meats:  o Herbed greek chicken salad with kalamata olives, cucumber, feta  o Red bell peppers stuffed with spinach, bulgur, lean ground beef (or lentils) & topped with feta    o Kebabs: skewers of chicken, tomatoes, onions, zucchini, squash  o [place] burgers: made with red onions, mint, dill, lemon juice, feta cheese topped with roasted red peppers . Vegetarian o Cucumber salad: cucumbers, artichoke hearts, celery, red onion, feta cheese, tossed in olive oil & lemon juice  o Hummus and whole grain pita points with a greek salad (lettuce, tomato, feta, olives, cucumbers, red onion) o Lentil soup with celery, carrots made with vegetable broth, garlic, salt and pepper  o Tabouli salad: parsley, bulgur, mint, scallions, cucumbers, tomato, radishes, lemon juice, olive oil, salt and pepper. 6.  Follow up in 3 months after repeat CT

## 2016-02-19 NOTE — Telephone Encounter (Signed)
I would advise no alcohol at this time.

## 2016-02-19 NOTE — Progress Notes (Signed)
NEUROLOGY FOLLOW UP OFFICE NOTE  Taliyha Hayakawa [id number]  HISTORY OF PRESENT ILLNESS: Allison Bass is a 75 year old right-handed woman with a history of hypertension, depression, anxiety, and seizures since her late 6s, who follows up for cerebrovascular disease and stroke.  History obtained by patient, Dr. Amparo Bristol note.  Labs, carotid doppler report and imaging of brain MRI personally reviewed.  UPDATE: MRI of the brain with and without contrast was performed on 01/29/16 as part of a syncope workup.  Findings revealed a small late-subacute to chronic hemorrhagic focus in the left cerebellum.  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  She subsequently underwent a stroke workup.  ASA was not started due to evidence of cerebellar hemorrhage.  Carotid doppler performed on 02/10/16 showed no hemodynamically significant ICA stenosis and both vertebral arteries showed antegrade flow.  Echocardiogram was normal with EF 65-70%.  She is currently doing well. CMP from 01/15/16 showed serum glucose of 120.  HISTORY: As per Dr. Amparo Bristol note:  The first seizure occurred in the early 1990s, she recalls feeling funny, "almost like I was going under surgery," then waking up in the hospital. Her husband had witnessed a generalized convulsion. She was started on Dilantin. She had been seizure-free for a while and a trial of Dilantin taper was done perhaps in 1995, during which she had a couple of breakthrough seizures within a week of stopping medication. She denies any further seizures since restarting the Dilantin in 2995. She denies any further episodes of the funny feeling, no  olfactory/gustatory hallucinations, deja vu,focal numbness/tingling/weakness, myoclonic jerks.  She had been doing well until 2 weeks ago, when on 01/12/16 she woke up and knew there was something she was supposed to do but could not recall it. She called her friend she  needed to pick up food for Me[employer] on Whe[employer], and after the reminder, she did not have any other issues that day. The next morning she got up and went to pick up food, then she could not think of where her next destination was. She had to call someone to remind her where to go, then she was fine. There is note that the day prior to the first episode she was upset because her cat ran away. She took her normal TID Xanax and then took 2 more prior to bed to help with sleep.  On 01/18/16, she was at church then had an intense feeling with nausea. The music was coming off the page, she got extremely hot and could hear things from a distance. She started to sit down and recalls her friend saying she could not lift her. She apparently fainted and woke up spread out on several chairs. She did not have the same "funny feeling" she had in the 1990s. No convulsive activity reported. She denied any sleep deprivation or alcohol the night prior.   She denies any headaches, dizziness, diplopia, dysarthria, dysphagia, neck pain, bowel/bladder dysfunction. She has migraines every 6 months or so. She has been having worsening back pain this week. No falls. She endorses some depression, reporting it is a "bit of a dark time the past month." She can't pinpoint one thing, but wakes up feeling alone and sad. No family history of memory changes. Her mother had seizures. Otherwise she had a normal birth and early development.  There is no history of febrile convulsions, CNS infections such as meningitis/encephalitis, significant traumatic brain injury, neurosurgical procedures, or family  history of seizures.  PAST MEDICAL HISTORY: Past Medical History  Diagnosis Date  . Chicken pox   . Depression   . GERD (gastroesophageal reflux disease)   . Seasonal allergies   . Migraine   . Heart murmur   . HTN (hypertension)   . Anxiety   . 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 ALPRAZolam (XANAX) 0.5 MG tablet TAKE ONE TABLET BY MOUTH THREE TIMES DAILY 90 tablet 0  . desvenlafaxine (PRISTIQ) 100 MG 24 hr tablet Take 1 tablet (100 mg total) by mouth daily. 90 tablet 3  . DILANTIN 100 MG ER capsule Take 3 capsules (300 mg total) by mouth daily. 270 capsule 3  . hydrochlorothiazide (HYDRODIURIL) 50 MG tablet TAKE 1 TABLET(50 MG) BY MOUTH DAILY (Patient taking differently: Take 1/2 tablet daily) 90 tablet 1  . lisinopril (PRINIVIL,ZESTRIL) 10 MG tablet Take 1 tablet (10 mg total) by mouth daily. 90 tablet 3  . potassium chloride (K-DUR,KLOR-CON) 10 MEQ tablet TAKE 2 TABLETS BY MOUTH EVERY MORNING AND EVERY EVENING (Patient taking differently: TAKE 2 TABLETS BY MOUTH EVERY MORNING AND 2 TABLETS EVERY EVENING) 360 tablet 1  . PREMARIN 1.25 MG tablet Take 1 tablet (1.25 mg total) by mouth daily. 90 tablet 3  . tiZANidine (ZANAFLEX) 4 MG tablet Take 1 tablet (4 mg total) by mouth every 8 (eight) hours as needed for muscle spasms. 30 tablet 1   No current facility-administered medications on file prior to visit.    ALLERGIES: Allergies  Allergen Reactions  . Codeine Rash    Makes the patient walk into walls    FAMILY HISTORY: Family History  Problem Relation Age of Onset  . Alcoholism Father   . Lung cancer Father     Smoker  . Stroke Father   . High blood pressure Father   . Breast cancer Maternal Grandmother   . Heart disease Mother   . High blood pressure Mother   . Depression Mother   . Seizures Mother   . Colon cancer Neg Hx   . Esophageal cancer Neg Hx   . Stomach cancer Neg Hx   . Rectal cancer Neg Hx     SOCIAL HISTORY: Social History   Social History  . Marital Status: Widowed    Spouse Name: N/A  . Number of Children: 3  . Years of Education: N/A   Occupational History  . Retired     retired   Social History Main Topics  . Smoking status: Never Smoker   .  Smokeless tobacco: Never Used  . Alcohol Use: 0.0 oz/week    0 Standard drinks or equivalent per week     Comment: Occ  . Drug Use: No  . Sexual Activity: Not on file   Other Topics Concern  . Not on file   Social History Narrative    REVIEW OF SYSTEMS: Constitutional: No fevers, chills, or sweats, no generalized fatigue, change in appetite Eyes: No visual changes, double vision, eye pain Ear, nose and throat: No hearing loss, ear pain, nasal congestion, sore throat Cardiovascular: No chest pain, palpitations Respiratory:  No shortness of breath at rest or with exertion, wheezes GastrointestinaI: No nausea, vomiting, diarrhea, abdominal pain, fecal incontinence Genitourinary:  No dysuria, urinary retention or frequency Musculoskeletal:  No neck pain, back pain Integumentary: No rash, pruritus, skin lesions Neurological: as above  Psychiatric: No depression, insomnia, anxiety Endocrine: No palpitations, fatigue, diaphoresis, mood swings, change in appetite, change in weight, increased thirst Hematologic/Lymphatic:  No anemia, purpura, petechiae. Allergic/Immunologic: no itchy/runny eyes, nasal congestion, recent allergic reactions, rashes  PHYSICAL EXAM: Filed Vitals:   02/19/16 1042  BP: 140/70  Pulse: 81   General: No acute distress.  Patient appears well-groomed.  normal body habitus. Head:  Normocephalic/atraumatic Eyes:  Fundi examined but not visualized Neck: supple, no paraspinal tenderness, full range of motion Heart:  Regular rate and rhythm Lungs:  Clear to auscultation bilaterally Back: No paraspinal tenderness Neurological Exam: alert and oriented to person, place, and time. Attention span and concentration intact, recent and remote memory intact, fund of knowledge intact.  Speech fluent and not dysarthric, language intact.  CN II-XII intact. Bulk and tone normal, muscle strength 5/5 throughout.  Sensation to light touch, temperature and vibration intact.  Deep  tendon reflexes 2+ throughout, toes downgoing.  Finger to nose and heel to shin testing intact.  Gait normal.  IMPRESSION: 1. 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  HTN  PLAN: 1.  Given location of hemorrhagic conversion, I would hold starting antiplatelet therapy for 3 months.   2.  We will check fasting lipid panel and Hgb A1c 3.  We will check MRA of head and neck. 4.  Continue blood pressure control 5.  Mediterranean diet 4.  Repeat Head CT in 3 months and follow up soon afterwards.  At that time, will likely initiate ASA 81mg  daily.  27 minutes spent face to face with patient, over 50% spent discussing diagnosis and plan.  Metta Clines, DO  CC:  Garnet Koyanagi, DO

## 2016-02-20 NOTE — Telephone Encounter (Signed)
Mychart message sent.

## 2016-02-25 ENCOUNTER — Encounter: Payer: Self-pay | Admitting: Neurology

## 2016-03-01 ENCOUNTER — Telehealth: Payer: Self-pay | Admitting: Family Medicine

## 2016-03-01 ENCOUNTER — Encounter: Payer: Self-pay | Admitting: Neurology

## 2016-03-01 ENCOUNTER — Other Ambulatory Visit: Payer: Self-pay | Admitting: Family Medicine

## 2016-03-01 ENCOUNTER — Ambulatory Visit
Admission: RE | Admit: 2016-03-01 | Discharge: 2016-03-01 | Disposition: A | Discharge status: Home / Self Care | Payer: Medicare Other | Source: Ambulatory Visit | Attending: Neurology | Admitting: Neurology

## 2016-03-01 DIAGNOSIS — G40909 Epilepsy, unspecified, not intractable, without status epilepticus: Secondary | ICD-10-CM | POA: Diagnosis not present

## 2016-03-01 DIAGNOSIS — I63112 Cerebral infarction due to embolism of left vertebral artery: Secondary | ICD-10-CM

## 2016-03-01 DIAGNOSIS — R413 Other amnesia: Secondary | ICD-10-CM | POA: Diagnosis not present

## 2016-03-01 DIAGNOSIS — I63012 Cerebral infarction due to thrombosis of left vertebral artery: Secondary | ICD-10-CM | POA: Diagnosis not present

## 2016-03-01 LAB — PHENYTOIN LEVEL, TOTAL: Phenytoin Lvl: 12.5 ug/mL (ref 10.0–20.0)

## 2016-03-01 MED ORDER — GADOBENATE DIMEGLUMINE 529 MG/ML IV SOLN
12.0000 mL | Freq: Once | INTRAVENOUS | Status: AC | PRN
  Administered 2016-03-01: 12 mL via INTRAVENOUS

## 2016-03-01 NOTE — Telephone Encounter (Signed)
Patient was notified of results & advisement. 

## 2016-03-01 NOTE — Telephone Encounter (Signed)
-----   Message from Pieter Partridge, DO sent at 03/01/2016  1:09 PM EDT ----- MRA of head and neck shows no blockage of arteries.  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  It is very small with no significant risk of bleeding, but I would recommend repeating an MRA of the head in one year to look for any change.  This can be discussed when she follows up with Dr. Delice Lesch

## 2016-03-02 ENCOUNTER — Encounter: Payer: Self-pay | Admitting: Neurology

## 2016-03-02 NOTE — Telephone Encounter (Signed)
Please see my chart message.  Please advise.

## 2016-03-03 ENCOUNTER — Encounter: Payer: Self-pay | Admitting: Neurology

## 2016-03-04 ENCOUNTER — Telehealth: Payer: Self-pay | Admitting: Neurology

## 2016-03-04 ENCOUNTER — Encounter: Payer: Self-pay | Admitting: Neurology

## 2016-03-04 NOTE — Telephone Encounter (Signed)
Spoke w/ patient. She will come to office to have blood drawn for fasting lipid and a1c

## 2016-03-04 NOTE — Telephone Encounter (Signed)
Pt needs to talk to someone about her blood work asap please call [phone]

## 2016-03-05 ENCOUNTER — Other Ambulatory Visit (INDEPENDENT_AMBULATORY_CARE_PROVIDER_SITE_OTHER): Payer: Medicare Other

## 2016-03-05 DIAGNOSIS — R739 Hyperglycemia, unspecified: Secondary | ICD-10-CM | POA: Diagnosis not present

## 2016-03-05 DIAGNOSIS — I63112 Cerebral infarction due to embolism of left vertebral artery: Secondary | ICD-10-CM | POA: Diagnosis not present

## 2016-03-05 LAB — LIPID PANEL
CHOL/HDL RATIO: 3
Cholesterol: 216 mg/dL — ABNORMAL HIGH (ref 0–200)
HDL: 85.4 mg/dL (ref >39.00)
LDL Cholesterol: 112 mg/dL — ABNORMAL HIGH (ref 0–99)
NONHDL: 130.56
Triglycerides: 93 mg/dL (ref 0.0–149.0)
VLDL: 18.6 mg/dL (ref 0.0–40.0)

## 2016-03-05 LAB — HEMOGLOBIN A1C: Hgb A1c MFr Bld: 5.6 % (ref 4.6–6.5)

## 2016-03-08 ENCOUNTER — Encounter: Payer: Self-pay | Admitting: Neurology

## 2016-03-08 ENCOUNTER — Telehealth: Payer: Self-pay | Admitting: Family Medicine

## 2016-03-08 MED ORDER — ATORVASTATIN CALCIUM 40 MG PO TABS: 40.0000 mg | ORAL_TABLET | Freq: Every day | ORAL | Status: DC

## 2016-03-08 NOTE — Telephone Encounter (Signed)
Patient notified of results & is agreeable to start Lipitor. Rx sent to her pharmacy.

## 2016-03-08 NOTE — Telephone Encounter (Signed)
-----   Message from Pieter Partridge, DO sent at 03/08/2016  7:19 AM EDT ----- LDL is 112.  I would like it less than 70.  I would recommend starting Lipitor 40mg  daily.

## 2016-03-09 ENCOUNTER — Encounter: Payer: Self-pay | Admitting: Family Medicine

## 2016-03-09 NOTE — Telephone Encounter (Signed)
Please see mychart message.

## 2016-03-15 ENCOUNTER — Other Ambulatory Visit: Payer: Self-pay | Admitting: Family Medicine

## 2016-03-15 NOTE — Telephone Encounter (Signed)
Rx faxed to pharmacy  

## 2016-03-15 NOTE — Telephone Encounter (Signed)
Patient requesting refill on xanax t.i.d last filled 02-16-16 with #90 and 0 rf, last seen in office 01/15/16. No uds.

## 2016-03-23 ENCOUNTER — Encounter: Payer: Self-pay | Admitting: Family Medicine

## 2016-03-26 DIAGNOSIS — S8991XA Unspecified injury of right lower leg, initial encounter: Secondary | ICD-10-CM | POA: Diagnosis not present

## 2016-03-26 DIAGNOSIS — M25561 Pain in right knee: Secondary | ICD-10-CM | POA: Diagnosis not present

## 2016-03-26 DIAGNOSIS — S93402A Sprain of unspecified ligament of left ankle, initial encounter: Secondary | ICD-10-CM | POA: Diagnosis not present

## 2016-03-26 DIAGNOSIS — Z79899 Other long term (current) drug therapy: Secondary | ICD-10-CM | POA: Diagnosis not present

## 2016-03-26 DIAGNOSIS — Z885 Allergy status to narcotic agent status: Secondary | ICD-10-CM | POA: Diagnosis not present

## 2016-03-26 DIAGNOSIS — M7989 Other specified soft tissue disorders: Secondary | ICD-10-CM | POA: Diagnosis not present

## 2016-03-31 ENCOUNTER — Encounter: Payer: Self-pay | Admitting: Family Medicine

## 2016-04-01 DIAGNOSIS — S93409A Sprain of unspecified ligament of unspecified ankle, initial encounter: Secondary | ICD-10-CM | POA: Insufficient documentation

## 2016-04-01 DIAGNOSIS — S93402A Sprain of unspecified ligament of left ankle, initial encounter: Secondary | ICD-10-CM | POA: Diagnosis not present

## 2016-04-01 NOTE — Telephone Encounter (Signed)
Message left to call the office.    KP 

## 2016-04-07 ENCOUNTER — Other Ambulatory Visit: Payer: Self-pay | Admitting: Family Medicine

## 2016-04-07 NOTE — Telephone Encounter (Signed)
Last alprazolam Rx: 03/15/16 UDS: none Next OV: 04/20/16. Can obtain UDS and CSC at follow up.  Rx printed and forwarded to PCP for signature.

## 2016-04-13 ENCOUNTER — Other Ambulatory Visit: Payer: Self-pay | Admitting: Family Medicine

## 2016-04-20 ENCOUNTER — Ambulatory Visit (INDEPENDENT_AMBULATORY_CARE_PROVIDER_SITE_OTHER): Payer: Medicare Other | Admitting: Family Medicine

## 2016-04-20 ENCOUNTER — Encounter: Payer: Self-pay | Admitting: Family Medicine

## 2016-04-20 VITALS — BP 126/73 | HR 77 | Temp 98.2°F | Ht 65.0 in | Wt 150.0 lb

## 2016-04-20 DIAGNOSIS — M25561 Pain in right knee: Secondary | ICD-10-CM

## 2016-04-20 DIAGNOSIS — I63112 Cerebral infarction due to embolism of left vertebral artery: Secondary | ICD-10-CM

## 2016-04-20 DIAGNOSIS — M545 Low back pain, unspecified: Secondary | ICD-10-CM

## 2016-04-20 DIAGNOSIS — S93402A Sprain of unspecified ligament of left ankle, initial encounter: Secondary | ICD-10-CM | POA: Diagnosis not present

## 2016-04-20 MED ORDER — CYCLOBENZAPRINE HCL 10 MG PO TABS
10.0000 mg | ORAL_TABLET | Freq: Three times a day (TID) | ORAL | Status: DC | PRN
Start: 2016-04-20 — End: 2017-06-29

## 2016-04-20 NOTE — Patient Instructions (Addendum)
Ankle Sprain An ankle sprain is an injury to the strong, fibrous tissues (ligaments) that hold the bones of your ankle joint together.  CAUSES An ankle sprain is usually caused by a fall or by twisting your ankle. Ankle sprains most commonly occur when you step on the outer edge of your foot, and your ankle turns inward. People who participate in sports are more prone to these types of injuries.  SYMPTOMS   Pain in your ankle. The pain may be present at rest or only when you are trying to stand or walk.  Swelling.  Bruising. Bruising may develop immediately or within 1 to 2 days after your injury.  Difficulty standing or walking, particularly when turning corners or changing directions. DIAGNOSIS  Your caregiver will ask you details about your injury and perform a physical exam of your ankle to determine if you have an ankle sprain. During the physical exam, your caregiver will press on and apply pressure to specific areas of your foot and ankle. Your caregiver will try to move your ankle in certain ways. An X-ray exam may be done to be sure a bone was not broken or a ligament did not separate from one of the bones in your ankle (avulsion fracture).  TREATMENT  Certain types of braces can help stabilize your ankle. Your caregiver can make a recommendation for this. Your caregiver may recommend the use of medicine for pain. If your sprain is severe, your caregiver may refer you to a surgeon who helps to restore function to parts of your skeletal system (orthopedist) or a physical therapist. [place] ice to your injury for 1-2 days or as directed by your caregiver. Applying ice helps to reduce inflammation and pain.  Put ice in a plastic bag.  Place a towel between your skin and the bag.  Leave the ice on for 15-20 minutes at a time, every 2 hours while you are awake.  Only take over-the-counter or prescription medicines for pain, discomfort, or fever as directed by  your caregiver.  Elevate your injured ankle above the level of your heart as much as possible for 2-3 days.  If your caregiver recommends crutches, use them as instructed. Gradually put weight on the affected ankle. Continue to use crutches or a cane until you can walk without feeling pain in your ankle.  If you have a plaster splint, wear the splint as directed by your caregiver. Do not rest it on anything harder than a pillow for the first 24 hours. Do not put weight on it. Do not get it wet. You may take it off to take a shower or bath.  You may have been given an elastic bandage to wear around your ankle to provide support. If the elastic bandage is too tight (you have numbness or tingling in your foot or your foot becomes cold and blue), adjust the bandage to make it comfortable.  If you have an air splint, you may blow more air into it or let air out to make it more comfortable. You may take your splint off at night and before taking a shower or bath. Wiggle your toes in the splint several times per day to decrease swelling. SEEK MEDICAL CARE IF:   You have rapidly increasing bruising or swelling.  Your toes feel extremely cold or you lose feeling in your foot.  Your pain is not relieved with medicine. SEEK IMMEDIATE MEDICAL CARE IF:  Your toes are numb or blue.  You have severe pain that is increasing. MAKE SURE YOU:   Understand these instructions.  Will watch your condition.  Will get help right away if you are not doing well or get worse.   This information is not intended to replace advice given to you by your health care provider. Make sure you discuss any questions you have with your health care provider.   Document Released: 10/11/2005 Document Revised: 11/01/2014 Document Reviewed: 10/23/2011 Elsevier Interactive Patient Education 2016 [employer]  Back Pain, Adult Back pain is very common in adults.The cause of back pain is rarely dangerous and the pain often  gets better over time.The cause of your back pain may not be known. Some common causes of back pain include:  Strain of the muscles or ligaments supporting the spine.  Wear and tear (degeneration) of the spinal disks.  Arthritis.  Direct injury to the back. For many people, back pain may return. Since back pain is rarely dangerous, most people can learn to manage this condition on their own. HOME CARE INSTRUCTIONS Watch your back pain for any changes. The following actions may help to lessen any discomfort you are feeling:  Remain active. It is stressful on your back to sit or stand in one place for long periods of time. Do not sit, drive, or stand in one place for more than 30 minutes at a time. Take short walks on even surfaces as soon as you are able.Try to increase the length of time you walk each day.  Exercise regularly as directed by your health care provider. Exercise helps your back heal faster. It also helps avoid future injury by keeping your muscles strong and flexible.  Do not stay in bed.Resting more than 1-2 days can delay your recovery.  Pay attention to your body when you bend and lift. The most comfortable positions are those that put less stress on your recovering back. Always use proper lifting techniques, including:  Bending your knees.  Keeping the load close to your body.  Avoiding twisting.  Find a comfortable position to sleep. Use a firm mattress and lie on your side with your knees slightly bent. If you lie on your back, put a pillow under your knees.  Avoid feeling anxious or stressed.Stress increases muscle tension and can worsen back pain.It is important to recognize when you are anxious or stressed and learn ways to manage it, such as with exercise.  Take medicines only as directed by your health care provider. Over-the-counter medicines to reduce pain and inflammation are often the most helpful.Your health care provider may prescribe muscle  relaxant drugs.These medicines help dull your pain so you can more quickly return to your normal activities and healthy exercise.  Apply ice to the injured area:  Put ice in a plastic bag.  Place a towel between your skin and the bag.  Leave the ice on for 20 minutes, 2-3 times a day for the first 2-3 days. After that, ice and heat may be alternated to reduce pain and spasms.  Maintain a healthy weight. Excess weight puts extra stress on your back and makes it difficult to maintain good posture. SEEK MEDICAL CARE IF:  You have pain that is not relieved with rest or medicine.  You have increasing pain going down into the legs or buttocks.  You have pain that does not improve in one week.  You have night pain.  You lose weight.  You have a fever or chills. [place]  IF:   You develop new bowel or bladder control problems.  You have unusual weakness or numbness in your arms or legs.  You develop nausea or vomiting.  You develop abdominal pain.  You feel faint.   This information is not intended to replace advice given to you by your health care provider. Make sure you discuss any questions you have with your health care provider.   Document Released: 10/11/2005 Document Revised: 11/01/2014 Document Reviewed: 02/12/2014 Elsevier Interactive Patient Education [employer].

## 2016-04-20 NOTE — Progress Notes (Signed)
Pre visit review using our clinic review tool, if applicable. No additional management support is needed unless otherwise documented below in the visit note. 

## 2016-04-21 NOTE — Progress Notes (Signed)
Patient ID: Allison Bass, female    DOB: 1941-10-11  Age: 75 y.o. MRN: [id number]    Subjective:  Subjective HPI Allison Bass presents for f/u ankle sprain and low back pain with knee pain.  Pt saw ortho in [place] for her ankle.  She fell at party-- her R knee gave way.  Her back has been bothering her as well.    Review of Systems  Constitutional: Negative for diaphoresis, appetite change, fatigue and unexpected weight change.  Eyes: Negative for pain, redness and visual disturbance.  Respiratory: Negative for cough, chest tightness, shortness of breath and wheezing.   Cardiovascular: Negative for chest pain, palpitations and leg swelling.  Endocrine: Negative for cold intolerance, heat intolerance, polydipsia, polyphagia and polyuria.  Genitourinary: Negative for dysuria, frequency and difficulty urinating.  Musculoskeletal: Positive for back pain, joint swelling and arthralgias.  Neurological: Negative for dizziness, light-headedness, numbness and headaches.    History Past Medical History  Diagnosis Date  . Chicken pox   . Depression   . GERD (gastroesophageal reflux disease)   . Seasonal allergies   . Migraine   . Heart murmur   . HTN (hypertension)   . Anxiety   . 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   Her family history includes Alcoholism in her father; Breast cancer in her maternal grandmother; Depression in her mother; Heart disease in her mother; High blood pressure in her father and mother; Lung cancer in her father; Seizures in her mother; Stroke in her father. There is no history of Colon cancer, Esophageal cancer, Stomach cancer, or Rectal cancer.She reports that she has never smoked. She has never used smokeless tobacco. She reports that she drinks alcohol. She  reports that she does not use illicit drugs.  Current Outpatient Prescriptions on File Prior to Visit  Medication Sig Dispense Refill  . ALPRAZolam (XANAX) 0.5 MG tablet TAKE ONE TABLET BY MOUTH THREE TIMES DAILY 90 tablet 0  . atorvastatin (LIPITOR) 40 MG tablet Take 1 tablet (40 mg total) by mouth daily. 30 tablet 3  . desvenlafaxine (PRISTIQ) 100 MG 24 hr tablet Take 1 tablet (100 mg total) by mouth daily. 90 tablet 3  . DILANTIN 100 MG ER capsule Take 3 capsules (300 mg total) by mouth daily. 270 capsule 3  . hydrochlorothiazide (HYDRODIURIL) 50 MG tablet TAKE 1 TABLET(50 MG) BY MOUTH DAILY (Patient taking differently: Take 1/2 tablet daily) 90 tablet 1  . lisinopril (PRINIVIL,ZESTRIL) 10 MG tablet Take 1 tablet (10 mg total) by mouth daily. 90 tablet 3  . potassium chloride (K-DUR,KLOR-CON) 10 MEQ tablet TAKE 2 TABLETS BY MOUTH EVERY MORNING AND EVERY EVENING (Patient taking differently: TAKE 2 TABLETS BY MOUTH EVERY MORNING AND 2 TABLETS EVERY EVENING) 360 tablet 1  . PREMARIN 1.25 MG tablet Take 1 tablet (1.25 mg total) by mouth daily. 90 tablet 3   No current facility-administered medications on file prior to visit.     Objective:  Objective Physical Exam  Constitutional: She is oriented to person, place, and time. She appears well-developed and well-nourished.  HENT:  Head: Normocephalic and atraumatic.  Eyes: Conjunctivae and EOM are normal.  Neck: Normal range of motion. Neck supple. No JVD present. Carotid bruit is not present. No thyromegaly present.  Cardiovascular: Normal rate, regular rhythm and normal heart sounds.   No murmur heard.  Pulmonary/Chest: Effort normal and breath sounds normal. No respiratory distress. She has no wheezes. She has no rales. She exhibits no tenderness.  Musculoskeletal: She exhibits edema and tenderness.       Right knee: She exhibits decreased range of motion and swelling. Tenderness found.       Left knee: She exhibits decreased range of  motion and swelling. She exhibits no erythema. Tenderness found.       Right ankle: She exhibits normal range of motion and no swelling.       Left ankle: Tenderness.       Lumbar back: She exhibits tenderness, pain and spasm. She exhibits normal range of motion, no swelling and no edema.       Feet:  Neurological: She is alert and oriented to person, place, and time. She displays normal reflexes. She exhibits normal muscle tone. Coordination normal.  Psychiatric: She has a normal mood and affect.  Nursing note and vitals reviewed.  BP 126/73 mmHg  Pulse 77  Temp(Src) 98.2 F (36.8 C) (Oral)  Ht 5\' 5"  (1.651 m)  Wt 150 lb (68.04 kg)  BMI 24.96 kg/m2  SpO2 97% Wt Readings from Last 3 Encounters:  04/20/16 150 lb (68.04 kg)  02/19/16 158 lb (71.668 kg)  01/26/16 154 lb (69.854 kg)     Lab Results  Component Value Date   WBC 6.5 01/15/2016   HGB 12.9 01/15/2016   HCT 38.3 01/15/2016   PLT 271.0 01/15/2016   GLUCOSE 120* 01/15/2016   CHOL 216* 03/05/2016   TRIG 93.0 03/05/2016   HDL 85.40 03/05/2016   LDLCALC 112* 03/05/2016   ALT 8 01/15/2016   AST 22 01/15/2016   NA 130* 01/15/2016   K 3.5 01/15/2016   CL 92* 01/15/2016   CREATININE 0.86 01/15/2016   BUN 13 01/15/2016   CO2 30 01/15/2016   TSH 2.27 01/15/2016   HGBA1C 5.6 03/05/2016    Mr Jodene Nam Head Wo Contrast  03/01/2016  CLINICAL DATA:  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 EXAM: MRA NECK WITHOUT AND WITH CONTRAST MRA HEAD WITHOUT CONTRAST TECHNIQUE: Multiplanar and multiecho pulse sequences of the neck were obtained without and with intravenous contrast. Angiographic images of the neck were obtained using MRA technique without and with intravenous contast.; Angiographic images of the Circle of Willis were obtained using MRA technique without intravenous contrast. CONTRAST:  69mL MULTIHANCE GADOBENATE DIMEGLUMINE 529 MG/ML IV SOLN COMPARISON:  MRI head 01/29/2016 FINDINGS: MRA NECK FINDINGS  Antegrade flow in the carotid and vertebral artery bilaterally. Mild atherosclerotic irregularity in the carotid bifurcation bilaterally. No significant carotid stenosis. Mild stenosis proximal right external carotid artery. Both vertebral arteries patent to the basilar. No evidence of stenosis or dissection. Aortic arch is normal. MRA HEAD FINDINGS Both vertebral arteries patent to the basilar. PICA patent bilaterally. Basilar widely patent. Superior cerebellar and posterior cerebral arteries patent bilaterally. Internal carotid artery patent bilaterally. There is decreased signal in the genu of the internal carotid artery which may be due to artifact. 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 IMPRESSION: No significant carotid or vertebral artery stenosis in the neck Decreased signal in the genu of the internal carotid artery bilaterally which may be due to atherosclerotic disease or artifact. 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 Electronically Signed   By: Franchot Gallo M.D.  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 EXAM: MRA NECK WITHOUT AND WITH CONTRAST MRA HEAD WITHOUT CONTRAST TECHNIQUE: Multiplanar and multiecho pulse sequences of the neck were obtained without and with intravenous contrast. Angiographic images of the neck were obtained using MRA technique without and with intravenous contast.; Angiographic images of the Circle of Willis were obtained using MRA technique without intravenous contrast. CONTRAST:  32mL MULTIHANCE GADOBENATE DIMEGLUMINE 529 MG/ML IV SOLN COMPARISON:  MRI head 01/29/2016 FINDINGS: MRA NECK FINDINGS Antegrade flow in the carotid and vertebral artery bilaterally. Mild atherosclerotic irregularity  in the carotid bifurcation bilaterally. No significant carotid stenosis. Mild stenosis proximal right external carotid artery. Both vertebral arteries patent to the basilar. No evidence of stenosis or dissection. Aortic arch is normal. MRA HEAD FINDINGS Both vertebral arteries patent to the basilar. PICA patent bilaterally. Basilar widely patent. Superior cerebellar and posterior cerebral arteries patent bilaterally. Internal carotid artery patent bilaterally. There is decreased signal in the genu of the internal carotid artery which may be due to artifact. 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 IMPRESSION: No significant carotid or vertebral artery stenosis in the neck Decreased signal in the genu of the internal carotid artery bilaterally which may be due to atherosclerotic disease or artifact. 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 Electronically Signed   By: Franchot Gallo M.D.   On: 03/01/2016 11:02     Assessment & Plan:  Plan I have discontinued Allison Bass's tiZANidine. I am also having her start on cyclobenzaprine. Additionally, I am having her maintain her desvenlafaxine, DILANTIN, hydrochlorothiazide, lisinopril, potassium chloride, PREMARIN, atorvastatin, and ALPRAZolam.  Meds ordered this encounter  Medications  . cyclobenzaprine (FLEXERIL) 10 MG tablet    Sig: Take 1 tablet (10 mg total) by mouth 3 (three) times daily as needed for muscle spasms.    Dispense:  30 tablet    Refill:  0    Problem List Items Addressed This Visit      Unprioritized   Low back pain   Relevant Medications   cyclobenzaprine (FLEXERIL) 10 MG tablet    Other Visit Diagnoses    Left ankle sprain, initial encounter    -  Primary    Right knee pain        Relevant Orders    Ambulatory referral to Sports Medicine     f/u with  ortho in [place]--- pt does not want to see that ortho for her back or knee Will refer to sport med and rx muscle relaxer Ice/ alt heat --- rest rto 2 weeks or sooner prn  Follow-up: Return in about 2 weeks (around 05/04/2016), or if symptoms worsen or fail to improve.  Ann Held, DO

## 2016-04-23 ENCOUNTER — Ambulatory Visit (HOSPITAL_BASED_OUTPATIENT_CLINIC_OR_DEPARTMENT_OTHER)
Admission: RE | Admit: 2016-04-23 | Discharge: 2016-04-23 | Disposition: A | Discharge status: Home / Self Care | Payer: Medicare Other | Source: Ambulatory Visit | Attending: Family Medicine | Admitting: Family Medicine

## 2016-04-23 ENCOUNTER — Ambulatory Visit (INDEPENDENT_AMBULATORY_CARE_PROVIDER_SITE_OTHER): Payer: Medicare Other | Admitting: Family Medicine

## 2016-04-23 ENCOUNTER — Ambulatory Visit: Payer: Medicare Other | Admitting: Family Medicine

## 2016-04-23 ENCOUNTER — Encounter: Payer: Self-pay | Admitting: Family Medicine

## 2016-04-23 VITALS — BP 133/84 | HR 79 | Ht 66.0 in | Wt 150.0 lb

## 2016-04-23 DIAGNOSIS — M79672 Pain in left foot: Secondary | ICD-10-CM | POA: Diagnosis not present

## 2016-04-23 DIAGNOSIS — I63112 Cerebral infarction due to embolism of left vertebral artery: Secondary | ICD-10-CM

## 2016-04-23 DIAGNOSIS — X501XXA Overexertion from prolonged static or awkward postures, initial encounter: Secondary | ICD-10-CM | POA: Insufficient documentation

## 2016-04-23 DIAGNOSIS — M25561 Pain in right knee: Secondary | ICD-10-CM | POA: Diagnosis not present

## 2016-04-23 DIAGNOSIS — S99922A Unspecified injury of left foot, initial encounter: Secondary | ICD-10-CM | POA: Insufficient documentation

## 2016-04-23 DIAGNOSIS — S93402A Sprain of unspecified ligament of left ankle, initial encounter: Secondary | ICD-10-CM | POA: Diagnosis not present

## 2016-04-23 MED ORDER — HYDROCODONE-ACETAMINOPHEN 5-325 MG PO TABS: 1.0000 | ORAL_TABLET | Freq: Four times a day (QID) | ORAL | Status: DC | PRN

## 2016-04-23 MED FILL — HYDROCODON-APAP 5-325: 5-325 | 10 days supply | Qty: 40 | Fill #0

## 2016-04-23 NOTE — Assessment & Plan Note (Signed)
known ACL tear.  Rest of exam is reassuring.  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  Start physical therapy - shown home exercises to do in meantime.

## 2016-04-23 NOTE — Assessment & Plan Note (Signed)
Grade 3.  Switch out of cam walker into ASO.  Shown home exercises to do daily.  Start physical therapy as well.  Icing, tylenol or norco as needed.  Vitamin C as a precaution but do not think she has developing CRPS at this time.  Crutches only if needed.  Elevation.  F/u in 2 weeks for reevaluation.

## 2016-04-23 NOTE — Patient Instructions (Addendum)
Get x-rays of your foot downstairs before you leave today. You have a Grade 3 ankle sprain. Ice the area for 15 minutes at a time, 3-4 times a day as long as this is swelling. Tylenol 500mg  1-2 tabs three times a day as needed for pain. Hydrocodone as needed for severe pain INSTEAD of the tylenol. Vitamin C 500mg  daily for 6 weeks - this helps prevent complex regional pain syndrome. Elevate above the level of your heart when possible Crutches if needed to help with walking Try to transition to the laceup brace if you can - ok to use the boot as needed still though. Come out of the boot/brace twice a day to do Up/down and alphabet exercises 2-3 sets of each. Start physical therapy for strengthening and balance exercises. Follow up with me in 2 weeks.  Also do physical therapy for your knee. You can start knee extensions, straight leg raises, hamstring curls, hamstring swings 3 sets of 10 at most once a day.

## 2016-04-23 NOTE — Progress Notes (Signed)
PCP: Ann Held, DO  Subjective:   HPI: Patient is a 75 y.o. female here for left ankle, right knee pain.  Patient reports on 6/2 she was at a wedding coming down some steps when her right knee buckled and she accidentally inverted her left ankle. Immediate pain, inability to bear weight. Had a lot of swelling. Went to ED same day - radiographs negative for fracture of ankle.  Radiographs of right knee with tiny loose body likely from arthropathy but no acute fracture - I am unable to pull up the images. 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 Written for physical therapy but hasn't started this and would like to go downstairs as more convenient for her. She tore right ACL in 1999 but did not have any surgical intervention for this. Occasionally gives way on her. No catching, locking. Using crutches because of ankle. Still swelling but has improved. Pain is 2/10 at rest, up to 7/10 with ambulation, sharp. No skin changes, numbness.  Past Medical History  Diagnosis Date  . Chicken pox   . Depression   . GERD (gastroesophageal reflux disease)   . Seasonal allergies   . Migraine   . Heart murmur   . HTN (hypertension)   . Anxiety   . Epilepsy ([place])     controlled by Dilantin- last seizure 1995    Current Outpatient Prescriptions on File Prior to Visit  Medication Sig Dispense Refill  . ALPRAZolam (XANAX) 0.5 MG tablet TAKE ONE TABLET BY MOUTH THREE TIMES DAILY 90 tablet 0  . atorvastatin (LIPITOR) 40 MG tablet Take 1 tablet (40 mg total) by mouth daily. 30 tablet 3  . cyclobenzaprine (FLEXERIL) 10 MG tablet Take 1 tablet (10 mg total) by mouth 3 (three) times daily as needed for muscle spasms. 30 tablet 0  . desvenlafaxine (PRISTIQ) 100 MG 24 hr tablet Take 1 tablet (100 mg total) by mouth daily. 90 tablet 3  . DILANTIN 100 MG ER capsule Take 3 capsules (300 mg total) by mouth daily. 270 capsule 3  . hydrochlorothiazide  (HYDRODIURIL) 50 MG tablet TAKE 1 TABLET(50 MG) BY MOUTH DAILY (Patient taking differently: Take 1/2 tablet daily) 90 tablet 1  . lisinopril (PRINIVIL,ZESTRIL) 10 MG tablet Take 1 tablet (10 mg total) by mouth daily. 90 tablet 3  . potassium chloride (K-DUR,KLOR-CON) 10 MEQ tablet TAKE 2 TABLETS BY MOUTH EVERY MORNING AND EVERY EVENING (Patient taking differently: TAKE 2 TABLETS BY MOUTH EVERY MORNING AND 2 TABLETS EVERY EVENING) 360 tablet 1  . PREMARIN 1.25 MG tablet Take 1 tablet (1.25 mg total) by mouth daily. 90 tablet 3   No current facility-administered medications on file prior to visit.    Past Surgical History  Procedure Laterality Date  . Abdominal hysterectomy    . Tonsillectomy and adenoidectomy    . Cataract extraction Bilateral   . Lymphadenectomy      Benign, age 33  . Appendectomy      with hysterectomy    Allergies  Allergen Reactions  . Erythromycin   . Codeine Rash    Makes the patient walk into walls    Social History   Social History  . Marital Status: Widowed    Spouse Name: N/A  . Number of Children: 3  . Years of Education: N/A   Occupational History  . Retired     retired   Social History Main Topics  . Smoking status: Never Smoker   .  Smokeless tobacco: Never Used  . Alcohol Use: 0.0 oz/week    0 Standard drinks or equivalent per week     Comment: Occ  . Drug Use: No  . Sexual Activity: Not on file   Other Topics Concern  . Not on file   Social History Narrative    Family History  Problem Relation Age of Onset  . Alcoholism Father   . Lung cancer Father     Smoker  . Stroke Father   . High blood pressure Father   . Breast cancer Maternal Grandmother   . Heart disease Mother   . High blood pressure Mother   . Depression Mother   . Seizures Mother   . Colon cancer Neg Hx   . Esophageal cancer Neg Hx   . Stomach cancer Neg Hx   . Rectal cancer Neg Hx     BP 133/84 mmHg  Pulse 79  Ht 5\' 6"  (1.676 m)  Wt 150 lb (68.04 kg)   BMI 24.22 kg/m2  Review of Systems: See HPI above.    Objective:  Physical Exam:  Gen: NAD, comfortable in exam room  Right knee: No gross deformity, ecchymoses, effusion. Minimal lateral joint line tenderness.  No other tenderness. FROM. 1+ ant drawer, 2+ lachmanns.  Negative post drawer.  Negative valgus/varus testing. Negative mcmurrays, apleys, patellar apprehension. NV intact distally.  Left ankle/foot: Mild lateral swelling.  No bruising, deformity.  Mild warmth anterior ankle.  Redness is similar to that of right ankle. Mod limitation all motions - worst pain with external rotation. TTP base 5th metatarsal, ATFL, lateral malleolus.  No other tenderness including fibular head.  2+ ant drawer, negative talar tilt.   Negative syndesmotic compression. Thompsons test negative. NV intact distally.  MSK u/s left ankle:  No cortical irregularities, edema, neovascularity of distal fibula, 5th metatarsal.  Peroneal tendons intact without target sign.    Assessment & Plan:  1. Left ankle sprain- Grade 3.  Switch out of cam walker into ASO.  Shown home exercises to do daily.  Start physical therapy as well.  Icing, tylenol or norco as needed.  Vitamin C as a precaution but do not think she has developing CRPS at this time.  Crutches only if needed.  Elevation.  F/u in 2 weeks for reevaluation.  2. Right knee pain, instability - known ACL tear.  Rest of exam is reassuring.  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  Start physical therapy - shown home exercises to do in meantime.

## 2016-04-26 NOTE — Addendum Note (Signed)
Addended by: Sherrie George F on: 04/26/2016 08:04 AM   Modules accepted: Orders

## 2016-04-29 ENCOUNTER — Ambulatory Visit: Payer: Medicare Other | Attending: Family Medicine | Admitting: Physical Therapy

## 2016-04-29 DIAGNOSIS — M25561 Pain in right knee: Secondary | ICD-10-CM | POA: Insufficient documentation

## 2016-04-29 DIAGNOSIS — M25672 Stiffness of left ankle, not elsewhere classified: Secondary | ICD-10-CM | POA: Diagnosis not present

## 2016-04-29 DIAGNOSIS — M25572 Pain in left ankle and joints of left foot: Secondary | ICD-10-CM | POA: Insufficient documentation

## 2016-04-29 DIAGNOSIS — R262 Difficulty in walking, not elsewhere classified: Secondary | ICD-10-CM | POA: Insufficient documentation

## 2016-04-29 NOTE — Therapy (Signed)
[facility] [address]  [place], [place], [address] Phone: [phone]   Fax:  [phone]  Physical Therapy Evaluation  Patient Details  Name: Allison Bass MRN: [id number] Date of Birth: 1941/04/27 Referring Provider: Karlton Lemon, MD  Encounter Date: 04/29/2016      PT End of Session - 04/29/16 1521    Visit Number 1   Number of Visits 12   Date for PT Re-Evaluation 06/24/16   PT Start Time [id number]   PT Stop Time 1507   PT Time Calculation (min) 62 min   Activity Tolerance Patient tolerated treatment well   Behavior During Therapy [facility] for tasks assessed/performed      Past Medical History  Diagnosis Date  . Chicken pox   . Depression   . GERD (gastroesophageal reflux disease)   . Seasonal allergies   . Migraine   . Heart murmur   . HTN (hypertension)   . Anxiety   . Epilepsy ([place])     controlled by Dilantin- last seizure 1995    Past Surgical History  Procedure Laterality Date  . Abdominal hysterectomy    . Tonsillectomy and adenoidectomy    . Cataract extraction Bilateral   . Lymphadenectomy      Benign, age 46  . Appendectomy      with hysterectomy    There were no vitals filed for this visit.       Subjective Assessment - 04/29/16 1412    Subjective On 03/26/16, pt  reports her R knee gave out while going down stairs in heels at a wedding, causing her to fall and sustain a grade 3 L ankle inversion sprain. Initially in walking boot until last week and currently in lace up ankle brace. Notes B numbess from ankle into feet at times.   Pertinent History 03/26/16: Left ankle sprain - Grade 3; Chronic R ACL tear from 1999   Limitations Standing;Walking   How long can you stand comfortably? limited by low back but not too much by L ankle or R knee   How long can you walk comfortably? 1.5 blocks   Diagnostic tests 04/23/16 ankle x-ray: No acute abnormality noted   Patient Stated Goals "To  be able to walk real well again and cut down on the knee unpredictability."   Currently in Pain? Yes   Pain Score 2   Currently 2-3/10; Least 0/10, Avg 2-3/10, Worst 4-5/10   Pain Location Ankle   Pain Orientation Left;Lateral   Pain Descriptors / Indicators Tightness;Stabbing  "stiff"   Pain Type Acute pain   Pain Radiating Towards n/a   Pain Onset More than a month ago   Pain Frequency Intermittent   Aggravating Factors  walking, bending at ankle   Pain Relieving Factors OTC pain meds q 6-8 hrs   Effect of Pain on Daily Activities Slower pace, limited walking tolerance   Multiple Pain Sites Yes   Pain Score 0  5/10 on average   Pain Location Knee   Pain Orientation Right;Anterior   Pain Descriptors / Indicators Sharp   Pain Type Chronic pain   Pain Onset More than a month ago   Pain Frequency Intermittent   Aggravating Factors  unpredictable, but always when moving   Pain Relieving Factors rest, ceasing activity            [facility] PT Assessment - 04/29/16 1405    Assessment   Medical Diagnosis L ankle sprain, chronic R ACL  tear   Referring Provider Karlton Lemon, MD   Onset Date/Surgical Date 03/26/16   Next MD Visit ~2 weeks   Prior Therapy none   Precautions   Required Braces or Orthoses Other Brace/Splint   Other Brace/Splint Lace-up ankle splint   Balance Screen   Has the patient fallen in the past 6 months Yes   How many times? 2   Has the patient had a decrease in activity level because of a fear of falling?  Yes   Is the patient reluctant to leave their home because of a fear of falling?  Yes   [place] residence   Type of [place] to enter   Entrance Stairs-Number of Steps 3   Entrance Stairs-Rails Right   Home Layout One level   [place] Crutches;Cane - single point   Prior Function   Level of Independence Independent;Needs assistance with homemaking  assistance with  cleaning/housekeepinh   Vocation Retired   Leisure Education administrator, Psychologist, occupational work   Observation/Other Assessments   Focus on Therapeutic Outcomes (FOTO)  Foot 50% (50% limitation); Predicted 59% (41% limitation)   ROM / Strength   AROM / PROM / Strength AROM;Strength;PROM   AROM   AROM Assessment Site Knee;Ankle   Right/Left Knee Right;Left   Right Knee Extension 6   Right Knee Flexion 124   Left Knee Extension -1   Left Knee Flexion 132   Right/Left Ankle Right;Left   Right Ankle Dorsiflexion 3   Right Ankle Plantar Flexion 44   Right Ankle Inversion 38   Right Ankle Eversion 20   Left Ankle Dorsiflexion 8   Left Ankle Plantar Flexion 76   Left Ankle Inversion 20   Left Ankle Eversion 4   PROM   PROM Assessment Site Knee;Ankle   Right/Left Knee Right   Right Knee Extension 0   Right Knee Flexion 131   Right/Left Ankle Left   Left Ankle Dorsiflexion 5   Left Ankle Inversion 25   Left Ankle Eversion 12   Strength   Strength Assessment Site Hip;Knee;Ankle   Right/Left Hip Right;Left   Right Hip Flexion 4-/5   Right Hip Extension 3+/5   Right Hip ABduction 4/5   Right Hip ADduction 3+/5   Left Hip Flexion 4/5   Left Hip Extension 3+/5   Left Hip ABduction 4/5   Left Hip ADduction 4/5   Right/Left Knee Right;Left   Right Knee Flexion 4-/5   Right Knee Extension 4/5  pain with resistance   Left Knee Flexion 4/5   Left Knee Extension 4+/5   Right/Left Ankle Right;Left   Right Ankle Dorsiflexion 5/5   Right Ankle Plantar Flexion 5/5   Right Ankle Inversion 5/5   Right Ankle Eversion 5/5   Left Ankle Dorsiflexion 3+/5   Left Ankle Plantar Flexion 3/5   Left Ankle Inversion 4-/5   Left Ankle Eversion 3-/5   Flexibility   Soft Tissue Assessment /Muscle Length yes   Hamstrings mildly tight bilaterally   Piriformis mildly tight bilaterally   Ambulation/Gait   Gait Pattern Antalgic;Decreased weight shift to left;Decreased dorsiflexion - left          Today's  Treatment  TherEx Review of MD prescribed HEP (ankle circles & ABC's) + added towel scrunches           PT Education - 04/29/16 1521    Education provided Yes   Education Details PT eval findings &  POC   Person(s) Educated Patient   Methods Explanation   Comprehension Verbalized understanding          PT Short Term Goals - 05-03-2016 1545    PT SHORT TERM GOAL #1   Title Independent with initial HEP by 05/20/16   Status New           PT Long Term Goals - May 03, 2016 1546    PT LONG TERM GOAL #1   Title Independent with advanced HEP as indicated by 06/24/16   Status New   PT LONG TERM GOAL #2   Title L ankle ROM WFL without pain by 06/24/16   Status New   PT LONG TERM GOAL #3   Title L ankle strength >/= 4/5 by 06/24/16   Status New   PT LONG TERM GOAL #4   Title B hip and knee strength >/= 4+/5 by 06/24/16   Status New   PT LONG TERM GOAL #5   Title Pt will ambulate with normal gait pattern w/o ankle brace by 06/24/16   Status New               Plan - 2016-05-03 1523    Clinical Impression Statement Allison Bass is a 75 y/o female who presents to OP PT with L ankle pain, stiffness and weakness secondary to grade 3 lateral ankle sprain from a fall on 03/26/16. Pt also referred for R knee instability from chronic ankle tear which lead to knee buckling causing the fall where she sprained her L ankle. Current/average L ankle pain 2-3/10 which is described mostly as stiffness, with least pain 0/10 & worst pain 4-5/10 which can be stabbing at times, esp after walking. R knee pain unpredictable but typically 5/10 sharp pain when present. Assessment reveals limited L ankle ROM in all planes, with greatest restriction in eversion & DF, with L ankle strength 3-/5 to 4-/5 as compared to 5/5 on R. R knee AROM lacks end range TKE and flexion by <10 dg with full ROM available passively. B LE weakness noted proximally R > L and hips > knees. Moderate tightness noted in L gastroc/soleus as  well as proximally in B hamstrings and piriformis. Combination of deficits from L ankle and R knee create antalgic gait pattern L > R. POC will focus on improving LE soft tissue pliability, restoring functional ROM in L ankle and active TKE in R knee, ankle and core/proximal strengthening and stability training, gait training, and manual therapy / modalities PRN for pain.   Rehab Potential Good   PT Frequency 2x / week   PT Duration 6 weeks   PT Treatment/Interventions Patient/family education;Therapeutic exercise;Manual techniques;Passive range of motion;Taping;Neuromuscular re-education;Balance training;Gait training;Stair training;Cryotherapy;Vasopneumatic Device;Electrical Stimulation;Ultrasound;Iontophoresis 4mg /ml Dexamethasone;Therapeutic activities;ADLs/Self Care Home Management   PT Next Visit Plan Create initial HEP with emhasis on L ankle ROM and proximal strengthening; Modalities PRN for pain/edema   Consulted and Agree with Plan of Care Patient      Patient will benefit from skilled therapeutic intervention in order to improve the following deficits and impairments:  Pain, Decreased range of motion, Impaired flexibility, Decreased strength, Difficulty walking, Abnormal gait, Decreased balance, Decreased activity tolerance, Increased edema  Visit Diagnosis: Stiffness of left ankle, not elsewhere classified  Pain in left ankle and joints of left foot  Pain in right knee  Difficulty in walking, not elsewhere classified      G-Codes - 05-03-16 1550    Functional Assessment Tool Used Foot FOTO = 50% (50% limitation)  Functional Limitation Mobility: Walking and moving around   Mobility: Walking and Moving Around Current Status [phone]) At least 40 percent but less than 60 percent impaired, limited or restricted   Mobility: Walking and Moving Around Goal Status [phone]) At least 40 percent but less than 60 percent impaired, limited or restricted       Problem List Patient Active  Problem List   Diagnosis Date Noted  . Right knee pain 04/23/2016  . Ankle sprain 04/01/2016  . Low back pain 01/27/2016  . Faintness 01/27/2016  . Transient alteration of awareness 01/27/2016  . Poison ivy dermatitis 03/11/2015  . 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 Epilepsy ([place]) 02/24/2015    Percival Spanish, PT, MPT 04/29/2016, 3:51 PM  [facility] [address]  [place], [place], [address] Phone: [phone]   Fax:  [phone]  Name: Allison Bass MRN: [id number] Date of Birth: [birth year]

## 2016-05-01 ENCOUNTER — Encounter: Payer: Self-pay | Admitting: Neurology

## 2016-05-04 ENCOUNTER — Encounter: Payer: Self-pay | Admitting: Neurology

## 2016-05-04 ENCOUNTER — Ambulatory Visit: Payer: Medicare Other

## 2016-05-04 DIAGNOSIS — R262 Difficulty in walking, not elsewhere classified: Secondary | ICD-10-CM | POA: Diagnosis not present

## 2016-05-04 DIAGNOSIS — M25572 Pain in left ankle and joints of left foot: Secondary | ICD-10-CM

## 2016-05-04 DIAGNOSIS — M25672 Stiffness of left ankle, not elsewhere classified: Secondary | ICD-10-CM

## 2016-05-04 DIAGNOSIS — M25561 Pain in right knee: Secondary | ICD-10-CM | POA: Diagnosis not present

## 2016-05-05 NOTE — Telephone Encounter (Signed)
Will forward to Dr. Delice Lesch when she returns.

## 2016-05-05 NOTE — Therapy (Signed)
[facility] [address]  [place], [place], [address] Phone: [phone]   Fax:  [phone]  Physical Therapy Treatment  Patient Details  Name: Jessilynn Foye MRN: [id number] Date of Birth: Dec 31, 1940 Referring Provider: Karlton Lemon, MD  Encounter Date: 05/04/2016      PT End of Session - 05/05/16 1304    Visit Number 2   Number of Visits 12   Date for PT Re-Evaluation 06/24/16   PT Start Time [id number]   PT Stop Time 1445   PT Time Calculation (min) 40 min   Activity Tolerance Patient tolerated treatment well   Behavior During Therapy [facility] for tasks assessed/performed      Past Medical History  Diagnosis Date  . Chicken pox   . Depression   . GERD (gastroesophageal reflux disease)   . Seasonal allergies   . Migraine   . Heart murmur   . HTN (hypertension)   . Anxiety   . Epilepsy ([place])     controlled by Dilantin- last seizure 1995    Past Surgical History  Procedure Laterality Date  . Abdominal hysterectomy    . Tonsillectomy and adenoidectomy    . Cataract extraction Bilateral   . Lymphadenectomy      Benign, age 75  . Appendectomy      with hysterectomy    There were no vitals filed for this visit.      Subjective Assessment - 05/04/16 1416    Subjective Pt. reports L ankle only has pain with weight bearing.  No pain in seated position initially today.     Patient Stated Goals "To be able to walk real well again and cut down on the knee unpredictability."   Currently in Pain? No/denies   Pain Score 0-No pain   Multiple Pain Sites No      Today's Treatment:  Therex: NuStep: level 1, 4 min  AROM L ankle ABC's x 1 min  AROM L ankle circles CW, CCW x 15 reps   Manual: L HS, glute, SKTC stretch x 30 sec  L ankle PROM all directions  Therex: 4 way L ankle with yellow TB x 15 reps Bridging x 15 reps Bridge with alteranting hip abd/ER with blue TB x 10 reps each  way  Vasonaumatic Device to L ankle: medium compression, coldest temp., L LE elevated, 15 min         PT Short Term Goals - 05/05/16 1305    PT SHORT TERM GOAL #1   Title Independent with initial HEP by 05/20/16   Status On-going           PT Long Term Goals - 05/05/16 1305    PT LONG TERM GOAL #1   Title Independent with advanced HEP as indicated by 06/24/16   Status On-going   PT LONG TERM GOAL #2   Title L ankle ROM WFL without pain by 06/24/16   Status On-going   PT LONG TERM GOAL #3   Title L ankle strength >/= 4/5 by 06/24/16   Status On-going   PT LONG TERM GOAL #4   Title B hip and knee strength >/= 4+/5 by 06/24/16   Status On-going   PT LONG TERM GOAL #5   Title Pt will ambulate with normal gait pattern w/o ankle brace by 06/24/16   Status On-going               Plan - 05/05/16 1304  Clinical Impression Statement Pt. reports L ankle only has pain with weight bearing.  No pain in seated position initially today.  Initial hip / knee strengthening HEP created today with good pt. respons; pt. instructed to continued conservative L ankle ROM HEP isssued by MD. Plan to monitor response to HEP next treatment.     PT Treatment/Interventions Patient/family education;Therapeutic exercise;Manual techniques;Passive range of motion;Taping;Neuromuscular re-education;Balance training;Gait training;Stair training;Cryotherapy;Vasopneumatic Device;Electrical Stimulation;Ultrasound;Iontophoresis 4mg /ml Dexamethasone;Therapeutic activities;ADLs/Self Care Home Management   PT Next Visit Plan Assess response to initial HEP; proximal strengthening; Modalities PRN for pain/edema      Patient will benefit from skilled therapeutic intervention in order to improve the following deficits and impairments:  Pain, Decreased range of motion, Impaired flexibility, Decreased strength, Difficulty walking, Abnormal gait, Decreased balance, Decreased activity tolerance, Increased edema  Visit  Diagnosis: Stiffness of left ankle, not elsewhere classified  Pain in left ankle and joints of left foot  Pain in right knee  Difficulty in walking, not elsewhere classified     Problem List Patient Active Problem List   Diagnosis Date Noted  . Right knee pain 04/23/2016  . Ankle sprain 04/01/2016  . Low back pain 01/27/2016  . Faintness 01/27/2016  . Transient alteration of awareness 01/27/2016  . Poison ivy dermatitis 03/11/2015  . 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 Epilepsy [facility]) 02/24/2015    Bess Harvest, PTA 05/05/2016, 1:15 PM  [facility] [address]  [place], [place], [address] Phone: [phone]   Fax:  [phone]  Name: Lovenia Pape MRN: [id number] Date of Birth: [birth year]

## 2016-05-06 ENCOUNTER — Ambulatory Visit: Payer: Medicare Other | Admitting: Physical Therapy

## 2016-05-06 DIAGNOSIS — R262 Difficulty in walking, not elsewhere classified: Secondary | ICD-10-CM | POA: Diagnosis not present

## 2016-05-06 DIAGNOSIS — M25672 Stiffness of left ankle, not elsewhere classified: Secondary | ICD-10-CM

## 2016-05-06 DIAGNOSIS — M25572 Pain in left ankle and joints of left foot: Secondary | ICD-10-CM

## 2016-05-06 DIAGNOSIS — M25561 Pain in right knee: Secondary | ICD-10-CM | POA: Diagnosis not present

## 2016-05-06 NOTE — Therapy (Signed)
[facility] [address]  [place], [place], [address] Phone: [phone]   Fax:  [phone]  Physical Therapy Treatment  Patient Details  Name: Allison Bass MRN: [id number] Date of Birth: Jan 12, 1941 Referring Provider: Karlton Lemon, MD  Encounter Date: 05/06/2016      PT End of Session - 05/06/16 1402    Visit Number 3   Number of Visits 12   Date for PT Re-Evaluation 06/24/16   PT Start Time 1402   PT Stop Time 1507   PT Time Calculation (min) 65 min   Activity Tolerance Patient tolerated treatment well   Behavior During Therapy [facility] for tasks assessed/performed      Past Medical History  Diagnosis Date  . Chicken pox   . Depression   . GERD (gastroesophageal reflux disease)   . Seasonal allergies   . Migraine   . Heart murmur   . HTN (hypertension)   . Anxiety   . 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 Abdominal hysterectomy    . Tonsillectomy and adenoidectomy    . Cataract extraction Bilateral   . Lymphadenectomy      Benign, age 56  . Appendectomy      with hysterectomy    There were no vitals filed for this visit.      Subjective Assessment - 05/06/16 1416    Subjective Pt reporting no issues or concerns with HEP.   Patient Stated Goals "To be able to walk real well again and cut down on the knee unpredictability."   Currently in Pain? Yes   Pain Score --  1-2/10   Pain Location Ankle   Pain Orientation Left;Lateral          Today's Treatment  TherEx NuStep: level 2 x 4 min  AROM DF/PF x 15 AROM L ankle ABC's   AROM L ankle circles CW/CCW x15 each  Manual L gastroc & soleus stretches x30" each  STM to distal gastroc/soleus  TherEx Bridge + Hip ADD ball squeeze 10x3" Hooklying Alteranting hip abd/ER with green TB x10 B Sidelying Hip clam with green TB x10 4 way L ankle with  yellow TB x15 (no TB resistance for Eversion) Seated gastroc & soleus stretches w/ towel x30" each  Modalities Vasonaumatic Device to L ankle: medium compression, coldest temp, L LE elevated, 15 min          PT Education - 05/06/16 1457    Education provided Yes   Education Details Review of initial HEP from pt memory + addition of seated gastroc & soleus stretches   Person(s) Educated Patient   Methods Explanation;Demonstration;Handout   Comprehension Verbalized understanding;Returned demonstration;Need further instruction          PT Short Term Goals - 05/05/16 1305    PT SHORT TERM GOAL #1   Title Independent with initial HEP by 05/20/16   Status On-going           PT Long Term Goals - 05/05/16 1305    PT LONG TERM GOAL #1   Title Independent with advanced HEP as indicated by 06/24/16   Status On-going   PT LONG TERM GOAL #2   Title L ankle ROM WFL without pain by 06/24/16   Status On-going   PT LONG TERM GOAL #3   Title L ankle strength >/= 4/5 by 06/24/16   Status  On-going   PT LONG TERM GOAL #4   Title B hip and knee strength >/= 4+/5 by 06/24/16   Status On-going   PT LONG TERM GOAL #5   Title Pt will ambulate with normal gait pattern w/o ankle brace by 06/24/16   Status On-going               Plan - 05/06/16 1455    Clinical Impression Statement Pt reporting less swelling today than last visit with only mild lateral edema noted today. C/o increased discomfort in L distal calf with NuStep with ttp noted over gastroc tendon/distal soleus musculotendinous junction which responded well to STM and gentle stretching, therefore seated gastroc and soleus stretches added to HEP. Reviewed initial HEP with no issues noted other than pt unable to recall 1 exercise and limited tolerance for L sidelying R clam when feet stacked due increased ankle pain from the pressure which resolved with repositioning of the ankles during this exercise.   PT Treatment/Interventions  Patient/family education;Therapeutic exercise;Manual techniques;Passive range of motion;Taping;Neuromuscular re-education;Balance training;Gait training;Stair training;Cryotherapy;Vasopneumatic Device;Electrical Stimulation;Ultrasound;Iontophoresis 4mg /ml Dexamethasone;Therapeutic activities;ADLs/Self Care Home Management   PT Next Visit Plan Proximal LE strengthening; Progress ankle strengthening/proprioception as tolerated;  Modalities PRN for pain/edema      Patient will benefit from skilled therapeutic intervention in order to improve the following deficits and impairments:  Pain, Decreased range of motion, Impaired flexibility, Decreased strength, Difficulty walking, Abnormal gait, Decreased balance, Decreased activity tolerance, Increased edema  Visit Diagnosis: Stiffness of left ankle, not elsewhere classified  Pain in left ankle and joints of left foot  Pain in right knee  Difficulty in walking, not elsewhere classified     Problem List Patient Active Problem List   Diagnosis Date Noted  . Right knee pain 04/23/2016  . Ankle sprain 04/01/2016  . Low back pain 01/27/2016  . Faintness 01/27/2016  . Transient alteration of awareness 01/27/2016  . Poison ivy dermatitis 03/11/2015  . 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 Epilepsy ([place]) 02/24/2015    Percival Spanish, PT, MPT 05/06/2016, 7:13 PM  [facility] [address]  [place], [place], [address] Phone: [phone]   Fax:  [phone]  Name: Armilda Frampton MRN: [id number] Date of Birth: 04/28/1941

## 2016-05-07 ENCOUNTER — Ambulatory Visit (INDEPENDENT_AMBULATORY_CARE_PROVIDER_SITE_OTHER): Payer: Medicare Other | Admitting: Family Medicine

## 2016-05-07 ENCOUNTER — Encounter: Payer: Self-pay | Admitting: Family Medicine

## 2016-05-07 VITALS — BP 123/76 | HR 80 | Ht 66.0 in | Wt 150.0 lb

## 2016-05-07 DIAGNOSIS — M25561 Pain in right knee: Secondary | ICD-10-CM

## 2016-05-07 DIAGNOSIS — I63112 Cerebral infarction due to embolism of left vertebral artery: Secondary | ICD-10-CM

## 2016-05-07 DIAGNOSIS — S93402D Sprain of unspecified ligament of left ankle, subsequent encounter: Secondary | ICD-10-CM

## 2016-05-07 NOTE — Patient Instructions (Signed)
You have made a remarkable recovery to date. We will make sure physical therapy is written for your right knee as well. Do home exercises on days you don't go to therapy. Use the ankle brace for 4 more weeks with long walking and on irregular surfaces. Follow up with me in 4 weeks.

## 2016-05-10 NOTE — Telephone Encounter (Signed)
Please see this and prior telephone message.

## 2016-05-11 NOTE — Assessment & Plan Note (Signed)
With instability - known ACL tear.  Rest of exam is reassuring.  Add PT for this.

## 2016-05-11 NOTE — Assessment & Plan Note (Signed)
Grade 3.  Amazing recovery to date with ASO, home exercises, physical therapy.  Continue home exercises, transition to this from PT.  Icing, tylenol if needed.  ASO for 4 more weeks on irregular surfaces or with long walking.

## 2016-05-11 NOTE — Progress Notes (Signed)
PCP: Ann Held, DO  Subjective:   HPI: Patient is a 75 y.o. female here for left ankle, right knee pain.  6/30: Patient reports on 6/2 she was at a wedding coming down some steps when her right knee buckled and she accidentally inverted her left ankle. Immediate pain, inability to bear weight. Had a lot of swelling. Went to ED same day - radiographs negative for fracture of ankle.  Radiographs of right knee with tiny loose body likely from arthropathy but no acute fracture - I am unable to pull up the images. 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 Written for physical therapy but hasn't started this and would like to go downstairs as more convenient for her. She tore right ACL in 1999 but did not have any surgical intervention for this. Occasionally gives way on her. No catching, locking. Using crutches because of ankle. Still swelling but has improved. Pain is 2/10 at rest, up to 7/10 with ambulation, sharp. No skin changes, numbness.  7/14: Patient reports she feels completely better. Did a week of physical therapy. Wearing ASO. Doing home exercises. Right knee still feels a little weak. Pain level 0/10. No skin changes, numbness.  Past Medical History  Diagnosis Date  . Chicken pox   . Depression   . GERD (gastroesophageal reflux disease)   . Seasonal allergies   . Migraine   . Heart murmur   . HTN (hypertension)   . Anxiety   . Epilepsy ([place])     controlled by Dilantin- last seizure 1995    Current Outpatient Prescriptions on File Prior to Visit  Medication Sig Dispense Refill  . ALPRAZolam (XANAX) 0.5 MG tablet TAKE ONE TABLET BY MOUTH THREE TIMES DAILY 90 tablet 0  . atorvastatin (LIPITOR) 40 MG tablet Take 1 tablet (40 mg total) by mouth daily. 30 tablet 3  . cyclobenzaprine (FLEXERIL) 10 MG tablet Take 1 tablet (10 mg total) by mouth 3 (three) times daily as needed for muscle spasms. 30 tablet 0  .  desvenlafaxine (PRISTIQ) 100 MG 24 hr tablet Take 1 tablet (100 mg total) by mouth daily. 90 tablet 3  . DILANTIN 100 MG ER capsule Take 3 capsules (300 mg total) by mouth daily. 270 capsule 3  . hydrochlorothiazide (HYDRODIURIL) 50 MG tablet TAKE 1 TABLET(50 MG) BY MOUTH DAILY (Patient taking differently: Take 1/2 tablet daily) 90 tablet 1  . HYDROcodone-acetaminophen (NORCO) 5-325 MG tablet Take 1 tablet by mouth every 6 (six) hours as needed for moderate pain. 40 tablet 0  . lisinopril (PRINIVIL,ZESTRIL) 10 MG tablet Take 1 tablet (10 mg total) by mouth daily. 90 tablet 3  . potassium chloride (K-DUR,KLOR-CON) 10 MEQ tablet TAKE 2 TABLETS BY MOUTH EVERY MORNING AND EVERY EVENING (Patient taking differently: TAKE 2 TABLETS BY MOUTH EVERY MORNING AND 2 TABLETS EVERY EVENING) 360 tablet 1  . PREMARIN 1.25 MG tablet Take 1 tablet (1.25 mg total) by mouth daily. 90 tablet 3   No current facility-administered medications on file prior to visit.    Past Surgical History  Procedure Laterality Date  . Abdominal hysterectomy    . Tonsillectomy and adenoidectomy    . Cataract extraction Bilateral   . Lymphadenectomy      Benign, age 81  . Appendectomy      with hysterectomy    Allergies  Allergen Reactions  . Erythromycin   . Codeine Rash    Makes the patient walk into  walls    Social History   Social History  . Marital Status: Widowed    Spouse Name: N/A  . Number of Children: 3  . Years of Education: N/A   Occupational History  . Retired     retired   Social History Main Topics  . Smoking status: Never Smoker   . Smokeless tobacco: Never Used  . Alcohol Use: 0.0 oz/week    0 Standard drinks or equivalent per week     Comment: Occ  . Drug Use: No  . Sexual Activity: Not on file   Other Topics Concern  . Not on file   Social History Narrative    Family History  Problem Relation Age of Onset  . Alcoholism Father   . Lung cancer Father     Smoker  . Stroke Father    . High blood pressure Father   . Breast cancer Maternal Grandmother   . Heart disease Mother   . High blood pressure Mother   . Depression Mother   . Seizures Mother   . Colon cancer Neg Hx   . Esophageal cancer Neg Hx   . Stomach cancer Neg Hx   . Rectal cancer Neg Hx     BP 123/76 mmHg  Pulse 80  Ht 5\' 6"  (1.676 m)  Wt 150 lb (68.04 kg)  BMI 24.22 kg/m2  Review of Systems: See HPI above.    Objective:  Physical Exam:  Gen: NAD, comfortable in exam room  Right knee: No gross deformity, ecchymoses, effusion. Minimal lateral joint line tenderness.  No other tenderness. FROM. 1+ ant drawer, 2+ lachmanns.  Negative post drawer.  Negative valgus/varus testing. Negative mcmurrays, apleys, patellar apprehension. NV intact distally.  Left ankle/foot: Minimal lateral swelling.  No bruising, deformity. FROM No tenderness.  1+ ant drawer, negative talar tilt.   Negative syndesmotic compression. Thompsons test negative. NV intact distally.    Assessment & Plan:  1. Left ankle sprain- Grade 3.  Amazing recovery to date with ASO, home exercises, physical therapy.  Continue home exercises, transition to this from PT.  Icing, tylenol if needed.  ASO for 4 more weeks on irregular surfaces or with long walking.    2. Right knee pain, instability - known ACL tear.  Rest of exam is reassuring.  Add PT for this.

## 2016-05-13 ENCOUNTER — Encounter: Payer: Medicare Other | Admitting: Physical Therapy

## 2016-05-17 ENCOUNTER — Ambulatory Visit
Admission: RE | Admit: 2016-05-17 | Discharge: 2016-05-17 | Disposition: A | Discharge status: Home / Self Care | Payer: Medicare Other | Source: Ambulatory Visit | Attending: Neurology | Admitting: Neurology

## 2016-05-17 ENCOUNTER — Encounter: Payer: Self-pay | Admitting: Neurology

## 2016-05-17 ENCOUNTER — Ambulatory Visit (INDEPENDENT_AMBULATORY_CARE_PROVIDER_SITE_OTHER): Payer: Medicare Other | Admitting: Neurology

## 2016-05-17 VITALS — BP 120/58 | HR 83 | Temp 98.4°F | Ht 66.0 in | Wt 156.0 lb

## 2016-05-17 DIAGNOSIS — I10 Essential (primary) hypertension: Secondary | ICD-10-CM | POA: Insufficient documentation

## 2016-05-17 DIAGNOSIS — R55 Syncope and collapse: Secondary | ICD-10-CM

## 2016-05-17 DIAGNOSIS — I63112 Cerebral infarction due to embolism of left vertebral artery: Secondary | ICD-10-CM

## 2016-05-17 MED ORDER — IOPAMIDOL (ISOVUE-300) INJECTION 61%
75.0000 mL | Freq: Once | INTRAVENOUS | Status: AC | PRN
  Administered 2016-05-17: 75 mL via INTRAVENOUS

## 2016-05-17 NOTE — Progress Notes (Signed)
NEUROLOGY FOLLOW UP OFFICE NOTE  Allison Bass [id number]  HISTORY OF PRESENT ILLNESS: I had the pleasure of seeing Allison Bass in follow-up in the neurology clinic on 05/17/2016.  The patient was last seen by one of my partners Dr. Tomi Likens 3 months ago to discuss abnormal MRI brain findings. Records were reviewed. 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  ASA was not started due to evidence of cerebellar hemorrhage. Stroke workup showed unremarkable carotid doppler, echo normal with EF 65-70%. She had an MRA head and neck which did not show significant stenosis, there was decreased signal in the genu of the ICA bilaterally which may be due to atherosclerotic disease or artifact. 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   Since her last visit, she denies any further episodes of memory loss since March. No further syncopal spells. She has seen her cardiologist Dr. Otho Perl. She denies any headaches, dizziness, diplopia, dysarthria/dysphagia, focal numbness/tingling/weakness. She has been dealing with pain in her knee and ankle, and has been seeing PT. She denies any falls.  Laboratory Data: Lab Results  Component Value Date   HGBA1C 5.6 03/05/2016   Lab Results  Component Value Date   CHOL 216 (H) 03/05/2016   HDL 85.40 03/05/2016   LDLCALC 112 (H) 03/05/2016   TRIG 93.0 03/05/2016   CHOLHDL 3 03/05/2016    HPI: This is a pleasant 75 yo RH woman with a history of hypertension, depression, anxiety, and seizures since her late 36s. The first seizure occurred in the early 1990s, she recalls feeling funny, "almost like I was going under surgery," then waking up in the hospital. Her husband had witnessed a generalized  convulsion. She was started on Dilantin. She had been seizure-free for a while and a trial of Dilantin taper was done perhaps in 1995, during which she had a couple of breakthrough seizures within a week of stopping medication. She denies any further seizures since restarting the Dilantin in 2995. She denies any further episodes of the funny feeling, no  olfactory/gustatory hallucinations, deja vu,focal numbness/tingling/weakness, myoclonic jerks.  Her mother had seizures. Otherwise she had a normal birth and early development.  There is no history of febrile convulsions, CNS infections such as meningitis/encephalitis, significant traumatic brain injury, neurosurgical procedures, or family history of seizures.  She had been doing well until 01/12/16 she woke up and knew there was something she was supposed to do but could not recall it. She called her friend she needed to pick up food for Meals on Wheels, and after the reminder, she did not have any other issues that day. The next morning she got up and went to pick up food, then she could not think of where her next destination was. She had to call someone to remind her where to go, then she was fine. There is note that the day prior to the first episode she was upset because her cat ran away. She took her normal TID Xanax and then took 2 more prior to bed to help with sleep.  On 01/18/16, she was at church then had an intense feeling with nausea. The music was coming off the page, she got extremely hot and could hear things from a distance. She started to sit down and  recalls her friend saying she could not lift her. She apparently fainted and woke up spread out on several chairs. She did not have the same "funny feeling" she had in the 1990s. No convulsive activity reported. She denied any sleep deprivation or alcohol the night prior.    PAST MEDICAL HISTORY: Past Medical History:  Diagnosis Date  . Anxiety   . Chicken pox   . Depression   . Epilepsy  ([place])    controlled by Dilantin- last seizure 1995  . GERD (gastroesophageal reflux disease)   . Heart murmur   . HTN (hypertension)   . Migraine   . Seasonal allergies     MEDICATIONS: Current Outpatient Prescriptions on File Prior to Visit  Medication Sig Dispense Refill  . ALPRAZolam (XANAX) 0.5 MG tablet TAKE ONE TABLET BY MOUTH THREE TIMES DAILY 90 tablet 0  . atorvastatin (LIPITOR) 40 MG tablet Take 1 tablet (40 mg total) by mouth daily. 30 tablet 3  . cyclobenzaprine (FLEXERIL) 10 MG tablet Take 1 tablet (10 mg total) by mouth 3 (three) times daily as needed for muscle spasms. 30 tablet 0  . desvenlafaxine (PRISTIQ) 100 MG 24 hr tablet Take 1 tablet (100 mg total) by mouth daily. 90 tablet 3  . DILANTIN 100 MG ER capsule Take 3 capsules (300 mg total) by mouth daily. 270 capsule 3  . hydrochlorothiazide (HYDRODIURIL) 50 MG tablet TAKE 1 TABLET(50 MG) BY MOUTH DAILY (Patient taking differently: Take 1/2 tablet daily) 90 tablet 1  . HYDROcodone-acetaminophen (NORCO) 5-325 MG tablet Take 1 tablet by mouth every 6 (six) hours as needed for moderate pain. 40 tablet 0  . lisinopril (PRINIVIL,ZESTRIL) 10 MG tablet Take 1 tablet (10 mg total) by mouth daily. 90 tablet 3  . potassium chloride (K-DUR,KLOR-CON) 10 MEQ tablet TAKE 2 TABLETS BY MOUTH EVERY MORNING AND EVERY EVENING (Patient taking differently: TAKE 2 TABLETS BY MOUTH EVERY MORNING AND 2 TABLETS EVERY EVENING) 360 tablet 1  . PREMARIN 1.25 MG tablet Take 1 tablet (1.25 mg total) by mouth daily. 90 tablet 3   No current facility-administered medications on file prior to visit.     ALLERGIES: Allergies  Allergen Reactions  . Erythromycin   . Codeine Rash    Makes the patient walk into walls    FAMILY HISTORY: Family History  Problem Relation Age of Onset  . Alcoholism Father   . Lung cancer Father     Smoker  . Stroke Father   . High blood pressure Father   . Breast cancer Maternal Grandmother   . Heart disease  Mother   . High blood pressure Mother   . Depression Mother   . Seizures Mother   . Colon cancer Neg Hx   . Esophageal cancer Neg Hx   . Stomach cancer Neg Hx   . Rectal cancer Neg Hx     SOCIAL HISTORY: Social History   Social History  . Marital status: Widowed    Spouse name: N/A  . Number of children: 3  . Years of education: N/A   Occupational History  . Retired     retired   Social History Main Topics  . Smoking status: Never Smoker  . Smokeless tobacco: Never Used  . Alcohol use 0.0 oz/week     Comment: Occ  . Drug use: No  . Sexual activity: Not on file   Other Topics Concern  . Not on file   Social History Narrative  . No narrative on file  REVIEW OF SYSTEMS: Constitutional: No fevers, chills, or sweats, no generalized fatigue, change in appetite Eyes: No visual changes, double vision, eye pain Ear, nose and throat: No hearing loss, ear pain, nasal congestion, sore throat Cardiovascular: No chest pain, palpitations Respiratory:  No shortness of breath at rest or with exertion, wheezes GastrointestinaI: No nausea, vomiting, diarrhea, abdominal pain, fecal incontinence Genitourinary:  No dysuria, urinary retention or frequency Musculoskeletal:  + neck pain, back pain Integumentary: No rash, pruritus, skin lesions Neurological: as above Psychiatric: No depression, insomnia, anxiety Endocrine: No palpitations, fatigue, diaphoresis, mood swings, change in appetite, change in weight, increased thirst Hematologic/Lymphatic:  No anemia, purpura, petechiae. Allergic/Immunologic: no itchy/runny eyes, nasal congestion, recent allergic reactions, rashes  PHYSICAL EXAM: Vitals:   05/17/16 1432  BP: (!) 120/58  Pulse: 83  Temp: 98.4 F (36.9 C)   General: No acute distress Head:  Normocephalic/atraumatic Neck: supple, no paraspinal tenderness, full range of motion Heart:  Regular rate and rhythm Lungs:  Clear to auscultation bilaterally Back: No  paraspinal tenderness Skin/Extremities: No rash, no edema Neurological Exam: alert and oriented to person, place, and time. No aphasia or dysarthria. Fund of knowledge is appropriate.  Recent and remote memory are intact.  Attention and concentration are normal.    Able to name objects and repeat phrases. Cranial nerves: Pupils equal, round, reactive to light. Extraocular movements intact with no nystagmus. Visual fields full. Facial sensation intact. No facial asymmetry. Tongue, uvula, palate midline.  Motor: Bulk and tone normal, muscle strength 5/5 throughout with no pronator drift.  Sensation to light touch intact.  No extinction to double simultaneous stimulation.  Deep tendon reflexes 2+ throughout, toes downgoing.  Finger to nose testing intact.  Gait narrow-based and steady, able to tandem walk adequately.  Romberg negative.  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 She has had breakthrough convulsions with attempts at Dilantin taper in the past. Last GTC was in 1995. She presented for 2 episodes of memory loss and an episode of loss of consciousness last 01/18/16. Her neurological exam is non-focal. 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 Repeat head CT today did not show any acute hemorrhage. Would recommend starting aspirin 81mg  daily for secondary stroke prevention. We discussed control of vascular risk factors, goal LDL less than 70, monitor BP. We discussed incidental finding of small aneurysm on MRA, repeat imaging will be done in 1 year. She denies any further symptoms since March 2017, she knows to go to the ER if symptoms change suddenly. She is aware of [place] driving laws to stop driving after an episode of loss of consciousness until 6 months event-free. She will follow-up in 3 months and knows to call for any changes.     Thank you for allowing me to participate in her care.  Please do not hesitate to call for any questions or concerns.  The duration of this appointment visit was 25 minutes of face-to-face time with the patient.  Greater than 50% of this time was spent in counseling, explanation of diagnosis, planning of further management, and coordination of care.   Ellouise Newer, M.D.   CC: Dr. Cheri Rous, Dr. Abran Richard (Cardiology)

## 2016-05-17 NOTE — Patient Instructions (Signed)
1. Start aspirin 81mg  daily 2. Continue control of BP, cholesterol, exercises 3. If symptoms change, go to ER immediately 4. Follow-up in 3 months

## 2016-05-18 ENCOUNTER — Ambulatory Visit: Payer: Medicare Other | Admitting: Physical Therapy

## 2016-05-18 ENCOUNTER — Other Ambulatory Visit: Payer: Self-pay | Admitting: Family Medicine

## 2016-05-18 DIAGNOSIS — M25672 Stiffness of left ankle, not elsewhere classified: Secondary | ICD-10-CM | POA: Diagnosis not present

## 2016-05-18 DIAGNOSIS — M25572 Pain in left ankle and joints of left foot: Secondary | ICD-10-CM

## 2016-05-18 DIAGNOSIS — R262 Difficulty in walking, not elsewhere classified: Secondary | ICD-10-CM

## 2016-05-18 DIAGNOSIS — M25561 Pain in right knee: Secondary | ICD-10-CM | POA: Diagnosis not present

## 2016-05-18 NOTE — Progress Notes (Signed)
Note routed

## 2016-05-18 NOTE — Therapy (Signed)
Allison Bass [address]  [place], [place], [address] Phone: [phone]   Fax:  [phone]  Physical Therapy Treatment  Patient Details  Name: Allison Bass MRN: [id number] Date of Birth: 03-25-1941 Referring Provider: Karlton Lemon, MD  Encounter Date: 05/18/2016      PT End of Session - 05/18/16 1408    Visit Number 4   Number of Visits 12   Date for PT Re-Evaluation 06/24/16   PT Start Time [id number]   PT Stop Time 1514   PT Time Calculation (min) 66 min   Activity Tolerance Patient tolerated treatment well   Behavior During Therapy [facility] for tasks assessed/performed      Past Medical History:  Diagnosis Date  . Anxiety   . Chicken pox   . Depression   . Epilepsy ([place])    controlled by Dilantin- last seizure 1995  . GERD (gastroesophageal reflux disease)   . Heart murmur   . HTN (hypertension)   . Migraine   . Seasonal allergies     Past Surgical History:  Procedure Laterality Date  . ABDOMINAL HYSTERECTOMY    . APPENDECTOMY     with hysterectomy  . CATARACT EXTRACTION Bilateral   . LYMPHADENECTOMY     Benign, age 54  . TONSILLECTOMY AND ADENOIDECTOMY      There were no vitals filed for this visit.      Subjective Assessment - 05/18/16 1415    Subjective Pt reports L ankle has greatly improved, but still experiencing pain in R knee. MD note from visit on 05/07/16 indicates plan to shift ankle focus to HEP and shift therapy focus to R knee.   Patient Stated Goals "To be able to walk real well again and cut down on the knee unpredictability."   Currently in Pain? Yes   Pain Score 0-No pain   Pain Location Ankle   Pain Score --  2-3/10   Pain Location Knee   Pain Orientation Right   Pain Descriptors / Indicators --  "[place]"          Today's Treatment  TherEx Rec Bike - lvl 1 x 5' 4 way L ankle with TB x15 - green for DF/PF, red for IV/EV Heel raises x10, UE support   Counter minisquat x10 B Standing HS curl x10,  Standing March x10 B Standing 3 way SLR (flexion, extension, abduction) x10  Modalities Vasonaumatic Device to L ankle & R knee: medium compression, coldest temp, LE's elevated, 15 min            PT Education - 05/18/16 1513    Education provided Yes   Education Details HEP addition (ankle TB exercises & standing exercises)   Person(s) Educated Patient   Methods Explanation;Demonstration   Comprehension Verbalized understanding;Returned demonstration;Need further instruction          PT Short Term Goals - 05/18/16 1517      PT SHORT TERM GOAL #1   Title Independent with initial HEP by 05/20/16   Status Achieved           PT Long Term Goals - 05/18/16 1517      PT LONG TERM GOAL #1   Title Independent with advanced HEP as indicated by 06/24/16   Status On-going     PT LONG TERM GOAL #2   Title L ankle ROM WFL without pain by 06/24/16   Status On-going     PT LONG TERM GOAL #3  Title L ankle strength >/= 4/5 by 06/24/16   Status On-going     PT LONG TERM GOAL #4   Title B hip and knee strength >/= 4+/5 by 06/24/16   Status On-going     PT LONG TERM GOAL #5   Title Pt will ambulate with normal gait pattern w/o ankle brace by 06/24/16   Status On-going               Plan - 05/18/16 1519    Clinical Impression Statement Pt noting considerable improvement in L ankle but continues to feel more limited by R knee. As of last MD visit, MD indicated that pt appropriate to transition ankle exercises to HEP and focus on R knee for future therapy visits, therefore reviewed and updated HEP to include ankle and proximal strengthening.   PT Treatment/Interventions Patient/family education;Therapeutic exercise;Manual techniques;Passive range of motion;Taping;Neuromuscular re-education;Balance training;Gait training;Stair training;Cryotherapy;Vasopneumatic Device;Electrical Stimulation;Ultrasound;Iontophoresis 4mg /ml  Dexamethasone;Therapeutic activities;ADLs/Self Care Home Management   PT Next Visit Plan Proximal LE strengthening; Progress knee strengthening & proprioception training as tolerated;  Modalities PRN for pain/edema   Consulted and Agree with Plan of Care Patient      Patient will benefit from skilled therapeutic intervention in order to improve the following deficits and impairments:  Pain, Decreased range of motion, Impaired flexibility, Decreased strength, Difficulty walking, Abnormal gait, Decreased balance, Decreased activity tolerance, Increased edema  Visit Diagnosis: Stiffness of left ankle, not elsewhere classified  Pain in left ankle and joints of left foot  Pain in right knee  Difficulty in walking, not elsewhere classified     Problem List Patient Active Problem List   Diagnosis Date Noted  . 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 Essential hypertension 05/17/2016  . Right knee pain 04/23/2016  . Ankle sprain 04/01/2016  . Low back pain 01/27/2016  . Faintness 01/27/2016  . Transient alteration of awareness 01/27/2016  . Poison ivy dermatitis 03/11/2015  . 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 Epilepsy ([place]) 02/24/2015    Percival Spanish, PT, MPT 05/18/2016, 3:26 PM  [facility] [address]  [place], [place], [address] Phone: [phone]   Fax:  [phone]  Name: Allison Bass MRN: [id number] Date of Birth: [birth year]

## 2016-05-19 NOTE — Telephone Encounter (Signed)
Last seen 04/20/16 and filled 04/07/16 #90  Please advise    KP

## 2016-05-20 ENCOUNTER — Ambulatory Visit: Payer: Medicare Other | Admitting: Physical Therapy

## 2016-05-20 ENCOUNTER — Telehealth: Payer: Self-pay | Admitting: Family Medicine

## 2016-05-20 DIAGNOSIS — R262 Difficulty in walking, not elsewhere classified: Secondary | ICD-10-CM | POA: Diagnosis not present

## 2016-05-20 DIAGNOSIS — M25561 Pain in right knee: Secondary | ICD-10-CM | POA: Diagnosis not present

## 2016-05-20 DIAGNOSIS — M25572 Pain in left ankle and joints of left foot: Secondary | ICD-10-CM

## 2016-05-20 DIAGNOSIS — M25672 Stiffness of left ankle, not elsewhere classified: Secondary | ICD-10-CM

## 2016-05-20 NOTE — Telephone Encounter (Signed)
Confirmed with CMA, Rx for ALPRAZolam will be faxed over today.

## 2016-05-20 NOTE — Therapy (Signed)
[facility] [address]  [place], [place], [address] Phone: [phone]   Fax:  [phone]  Physical Therapy Treatment  Patient Details  Name: Allison Bass MRN: [id number] Date of Birth: Mar 31, 1941 Referring Provider: Karlton Lemon, MD  Encounter Date: 05/20/2016      PT End of Session - 05/20/16 1402    Visit Number 5   Number of Visits 12   Date for PT Re-Evaluation 06/24/16   PT Start Time [id number]   PT Stop Time 1504   PT Time Calculation (min) 62 min   Activity Tolerance Patient tolerated treatment well;Patient limited by pain   Behavior During Therapy [facility] for tasks assessed/performed      Past Medical History:  Diagnosis Date  . Anxiety   . Chicken pox   . Depression   . Epilepsy ([place])    controlled by Dilantin- last seizure 1995  . GERD (gastroesophageal reflux disease)   . Heart murmur   . HTN (hypertension)   . Migraine   . Seasonal allergies     Past Surgical History:  Procedure Laterality Date  . ABDOMINAL HYSTERECTOMY    . APPENDECTOMY     with hysterectomy  . CATARACT EXTRACTION Bilateral   . LYMPHADENECTOMY     Benign, age 69  . TONSILLECTOMY AND ADENOIDECTOMY      There were no vitals filed for this visit.      Subjective Assessment - 05/20/16 1402    Subjective Pt reporting no issues with ankle TB exercises but noted increased knee and ankle pain after attempting heel raises at home.   Patient Stated Goals "To be able to walk real well again and cut down on the knee unpredictability."   Currently in Pain? Yes   Pain Score 0-No pain   Pain Location Ankle   Pain Score 3   Pain Location Knee   Pain Orientation Right          Today's Treatment  TherEx Rec Bike - lvl 1 x 5' Seated HS curl 10x3" Hooklying Alt Hip ABD/ER with blue TB 10x3" B Sidelying Hip clam with blue TB x10 B HS curls with heels on peanut ball x10 B HS curl + Bridge x3 (stopped d/t R knee  pain) B Straight leg Bridge x3 (stopped d/t R knee pain)  Kinesiotaping - R knee - 3 "I" strips   2 strips (30%) medial & lateral to patella crossing over tibial tuberosity & distal quad   1 strip (50%) horizontally across inferior patella/patellar tendon extending medially along joint line  Standing on blue foam Airex:    Lat weight shift     Heel-toe weight shift    Stepping/Marching in place  Modalities Vasonaumatic Device to L ankle & R knee: medium compression, coldest temp, LE's elevated, 15 min           PT Short Term Goals - 05/18/16 1517      PT SHORT TERM GOAL #1   Title Independent with initial HEP by 05/20/16   Status Achieved           PT Long Term Goals - 05/18/16 1517      PT LONG TERM GOAL #1   Title Independent with advanced HEP as indicated by 06/24/16   Status On-going     PT LONG TERM GOAL #2   Title L ankle ROM WFL without pain by 06/24/16   Status On-going     PT LONG TERM  GOAL #3   Title L ankle strength >/= 4/5 by 06/24/16   Status On-going     PT LONG TERM GOAL #4   Title B hip and knee strength >/= 4+/5 by 06/24/16   Status On-going     PT LONG TERM GOAL #5   Title Pt will ambulate with normal gait pattern w/o ankle brace by 06/24/16   Status On-going               Plan - 05/20/16 1422    Clinical Impression Statement Pt reporting increased anterior R knee pain localized just inferior-lateral the patella limiting tolerance for some exercises, therefore initiated a trial of kinesiotaping to reduce pain/irritation and will assess reponse at next visit. Introduced Secretary/administrator with standing activities on Airex pad. Pt very uncertain and hesistant with standing on Airex even with 1 pole assist, therefore limited progression of activities.   PT Treatment/Interventions Patient/family education;Therapeutic exercise;Manual techniques;Passive range of motion;Taping;Neuromuscular re-education;Balance training;Gait training;Stair  training;Cryotherapy;Vasopneumatic Device;Electrical Stimulation;Ultrasound;Iontophoresis 4mg /ml Dexamethasone;Therapeutic activities;ADLs/Self Care Home Management   PT Next Visit Plan Assess response to taping; Proximal LE strengthening; Progress knee strengthening & proprioception training as tolerated;  Modalities PRN for pain/edema   Consulted and Agree with Plan of Care Patient      Patient will benefit from skilled therapeutic intervention in order to improve the following deficits and impairments:  Pain, Decreased range of motion, Impaired flexibility, Decreased strength, Difficulty walking, Abnormal gait, Decreased balance, Decreased activity tolerance, Increased edema  Visit Diagnosis: Stiffness of left ankle, not elsewhere classified  Pain in left ankle and joints of left foot  Pain in right knee  Difficulty in walking, not elsewhere classified     Problem List Patient Active Problem List   Diagnosis Date Noted  . 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 Essential hypertension 05/17/2016  . Right knee pain 04/23/2016  . Ankle sprain 04/01/2016  . Low back pain 01/27/2016  . Faintness 01/27/2016  . Transient alteration of awareness 01/27/2016  . Poison ivy dermatitis 03/11/2015  . 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 Epilepsy ([place]) 02/24/2015    Percival Spanish, PT, MPT 05/20/2016, 7:19 PM  [facility] [address]  [place], [place], [address] Phone: [phone]   Fax:  [phone]  Name: Allison Bass MRN: [id number] Date of Birth: 10-15-1941

## 2016-05-20 NOTE — Telephone Encounter (Signed)
Rx faxed.    KP 

## 2016-05-21 ENCOUNTER — Other Ambulatory Visit: Payer: Self-pay | Admitting: Family Medicine

## 2016-05-24 ENCOUNTER — Telehealth: Payer: Self-pay | Admitting: Neurology

## 2016-05-24 NOTE — Telephone Encounter (Signed)
She would like to know if she can now take Ibuprofen for her knee? Her # is  [phone]. Thank you

## 2016-05-25 ENCOUNTER — Ambulatory Visit: Payer: Medicare Other

## 2016-05-25 NOTE — Telephone Encounter (Signed)
Any reason she can't take ibuprofen?

## 2016-05-25 NOTE — Telephone Encounter (Signed)
Patient made aware.

## 2016-05-25 NOTE — Telephone Encounter (Signed)
Ok to take ibuprofen, but would only take it 2-3 times a week. Thanks

## 2016-05-27 ENCOUNTER — Ambulatory Visit: Payer: Medicare Other | Attending: Family Medicine

## 2016-05-27 DIAGNOSIS — R262 Difficulty in walking, not elsewhere classified: Secondary | ICD-10-CM | POA: Diagnosis not present

## 2016-05-27 DIAGNOSIS — M25672 Stiffness of left ankle, not elsewhere classified: Secondary | ICD-10-CM | POA: Diagnosis not present

## 2016-05-27 DIAGNOSIS — M25572 Pain in left ankle and joints of left foot: Secondary | ICD-10-CM | POA: Insufficient documentation

## 2016-05-27 DIAGNOSIS — M25561 Pain in right knee: Secondary | ICD-10-CM

## 2016-05-27 NOTE — Therapy (Signed)
[facility] [address]  [place], [place], [address] Phone: [phone]   Fax:  [phone]  Physical Therapy Treatment  Patient Details  Name: Allison Bass MRN: [id number] Date of Birth: 07/23/1941 Referring Provider: Karlton Lemon, MD  Encounter Date: 05/27/2016      PT End of Session - 05/27/16 1459    Visit Number 6   Number of Visits 12   Date for PT Re-Evaluation 06/24/16   PT Start Time [id number]   PT Stop Time 1450   PT Time Calculation (min) 43 min   Activity Tolerance Patient tolerated treatment well;Patient limited by pain   Behavior During Therapy [facility] for tasks assessed/performed      Past Medical History:  Diagnosis Date  . Anxiety   . Chicken pox   . Depression   . Epilepsy ([place])    controlled by Dilantin- last seizure 1995  . GERD (gastroesophageal reflux disease)   . Heart murmur   . HTN (hypertension)   . Migraine   . Seasonal allergies     Past Surgical History:  Procedure Laterality Date  . ABDOMINAL HYSTERECTOMY    . APPENDECTOMY     with hysterectomy  . CATARACT EXTRACTION Bilateral   . LYMPHADENECTOMY     Benign, age 82  . TONSILLECTOMY AND ADENOIDECTOMY      There were no vitals filed for this visit.      Subjective Assessment - 05/27/16 1413    Subjective Pt. reporting the dog ran into her R knee two days ago and this cause significant pain at the R knee for the rest of that day.  Pt. pain free initially today.     Patient Stated Goals "To be able to walk real well again and cut down on the knee unpredictability."   Currently in Pain? No/denies   Pain Score 0-No pain   Multiple Pain Sites No      Today's Treatment  TherEx Rec Bike - lvl 1 x 5' Hooklying bridge x 15 reps Hooklying bridge with B hip/ER with blue TB x 10 reps  Seated HS curl 10x3" Hooklying bridge with Alt Hip ABD/ER with blue TB 10x3" B Sidelying Hip clam with blue TB x 10 B HS curls with  heels on peanut ball x 10 reps  B Straight leg Bridge x 10 (limited ROM due to knee pain at top range) Standing on blue foam Airex:    Lat weight shift     Stepping/Marching in place         PT Short Term Goals - 05/18/16 1517      PT SHORT TERM GOAL #1   Title Independent with initial HEP by 05/20/16   Status Achieved           PT Long Term Goals - 05/18/16 1517      PT LONG TERM GOAL #1   Title Independent with advanced HEP as indicated by 06/24/16   Status On-going     PT LONG TERM GOAL #2   Title L ankle ROM WFL without pain by 06/24/16   Status On-going     PT LONG TERM GOAL #3   Title L ankle strength >/= 4/5 by 06/24/16   Status On-going     PT LONG TERM GOAL #4   Title B hip and knee strength >/= 4+/5 by 06/24/16   Status On-going     PT LONG TERM GOAL #5   Title Pt will  ambulate with normal gait pattern w/o ankle brace by 06/24/16   Status On-going               Plan - 05/27/16 1500    Clinical Impression Statement Pt. reporting the dog ran into her R knee two days ago and this cause significant pain at the R knee for the rest of that day.  Pt. pain free initially today.  Today's treatment continued focus on glute and HS strengthening activity with standing weight shift on complaint surface for ankle stability.  Pt. tolerated activities well and was pain free with all therex however still apprehensive with balance activities on airex pad.  Taping still applied today however pt. reporting she couldn't tell any difference with taping application; taping not reapplied today.    PT Treatment/Interventions Patient/family education;Therapeutic exercise;Manual techniques;Passive range of motion;Taping;Neuromuscular re-education;Balance training;Gait training;Stair training;Cryotherapy;Vasopneumatic Device;Electrical Stimulation;Ultrasound;Iontophoresis 4mg /ml Dexamethasone;Therapeutic activities;ADLs/Self Care Home Management   PT Next Visit Plan Proximal LE  strengthening; Progress knee strengthening & proprioception training as tolerated;  Modalities PRN for pain/edema      Patient will benefit from skilled therapeutic intervention in order to improve the following deficits and impairments:  Pain, Decreased range of motion, Impaired flexibility, Decreased strength, Difficulty walking, Abnormal gait, Decreased balance, Decreased activity tolerance, Increased edema  Visit Diagnosis: Stiffness of left ankle, not elsewhere classified  Pain in left ankle and joints of left foot  Pain in right knee  Difficulty in walking, not elsewhere classified     Problem List Patient Active Problem List   Diagnosis Date Noted  . 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 Essential hypertension 05/17/2016  . Right knee pain 04/23/2016  . Ankle sprain 04/01/2016  . Low back pain 01/27/2016  . Faintness 01/27/2016  . Transient alteration of awareness 01/27/2016  . Poison ivy dermatitis 03/11/2015  . 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 Epilepsy [facility]) 02/24/2015    Bess Harvest, PTA 05/27/2016, 3:05 PM  [facility] [address]  [place], [place], [address] Phone: [phone]   Fax:  [phone]  Name: Allison Bass MRN: [id number] Date of Birth: 1940/11/14

## 2016-06-01 ENCOUNTER — Ambulatory Visit: Payer: Medicare Other

## 2016-06-01 DIAGNOSIS — M25561 Pain in right knee: Secondary | ICD-10-CM

## 2016-06-01 DIAGNOSIS — R262 Difficulty in walking, not elsewhere classified: Secondary | ICD-10-CM | POA: Diagnosis not present

## 2016-06-01 DIAGNOSIS — M25572 Pain in left ankle and joints of left foot: Secondary | ICD-10-CM

## 2016-06-01 DIAGNOSIS — M25672 Stiffness of left ankle, not elsewhere classified: Secondary | ICD-10-CM | POA: Diagnosis not present

## 2016-06-01 NOTE — Therapy (Signed)
[facility] [address]  [place], [place], [address] Phone: [phone]   Fax:  [phone]  Physical Therapy Treatment  Patient Details  Name: Allison Bass MRN: [id number] Date of Birth: [birth year] Referring Provider: Karlton Lemon, MD  Encounter Date: 06/01/2016      PT End of Session - 06/01/16 1415    Visit Number 7   Number of Visits 12   Date for PT Re-Evaluation 06/24/16   PT Start Time 1749   PT Stop Time 1445   PT Time Calculation (min) 40 min   Activity Tolerance Patient tolerated treatment well;Patient limited by pain   Behavior During Therapy [facility] for tasks assessed/performed      Past Medical History:  Diagnosis Date  . Anxiety   . Chicken pox   . Depression   . Epilepsy ([place])    controlled by Dilantin- last seizure 1995  . GERD (gastroesophageal reflux disease)   . Heart murmur   . HTN (hypertension)   . Migraine   . Seasonal allergies     Past Surgical History:  Procedure Laterality Date  . ABDOMINAL HYSTERECTOMY    . APPENDECTOMY     with hysterectomy  . CATARACT EXTRACTION Bilateral   . LYMPHADENECTOMY     Benign, age 29  . TONSILLECTOMY AND ADENOIDECTOMY      There were no vitals filed for this visit.      Subjective Assessment - 06/01/16 1411    Subjective Pt. reports she has been pain free at the L ankle initially today and reports she has been pain free over the weekend.     Patient Stated Goals "To be able to walk real well again and cut down on the knee unpredictability."   Currently in Pain? No/denies   Pain Score 0-No pain   Multiple Pain Sites No            [facility] PT Assessment - 06/01/16 1429      Strength   Strength Assessment Site Hip;Knee   Right Hip Flexion 4/5   Right Hip Extension 4/5   Right Hip ABduction 4+/5   Right Hip ADduction 4/5   Left Hip Flexion 4/5   Left Hip Extension 4-/5   Left Hip ABduction 4-/5   Left Hip ADduction 4/5    Right/Left Knee Right;Left   Right Knee Flexion 4/5   Right Knee Extension 4/5  pt. with apprehension with this one   Left Knee Flexion 4/5   Left Knee Extension 5/5   Right/Left Ankle Right;Left   Right Ankle Dorsiflexion 5/5   Right Ankle Plantar Flexion 5/5   Right Ankle Inversion 5/5   Right Ankle Eversion 5/5   Left Ankle Dorsiflexion 5/5   Left Ankle Plantar Flexion 4/5   Left Ankle Inversion 4+/5   Left Ankle Eversion 4+/5     Today's Treatment  TherEx Rec Bike - lvl 2 x 5' Supine L ankle EV, IR, DF with green TB x 15 reps Hooklying bridge with alternating hip abd/ER with blue TB x 10 reps each B Sidelying Hip clam with blueTB x 10 BATCA HS curl machine 25# x 10 reps; B concentric, B eccentric  Hooklying L LE march with blue TB x 10 reps  R step up on 8" step x 10 reps; 2 pole assist   MMT testing         PT Short Term Goals - 05/18/16 1517      PT  SHORT TERM GOAL #1   Title Independent with initial HEP by 05/20/16   Status Achieved           PT Long Term Goals - 06/01/16 1500     PT LONG TERM GOAL #1   Title Independent with advanced HEP as indicated by 06/24/16   Status On-going     PT LONG TERM GOAL #2   Title L ankle ROM WFL without pain by 06/24/16   Status Partially Met  06/01/16: pt. pain free with L ankle ROM with exception of therapist assisted IV.      PT LONG TERM GOAL #3   Title L ankle strength >/= 4/5 by 06/24/16   Status Achieved     PT LONG TERM GOAL #4   Title B hip and knee strength >/= 4+/5 by 06/24/16   Status On-going     PT LONG TERM GOAL #5   Title Pt will ambulate with normal gait pattern w/o ankle brace by 06/24/16   Status On-going               Plan - 06/01/16 1419    Clinical Impression Statement Pt. pain free initially today and has been pain free over the weekend as well.  Pt. able to meet L ankle strength goal today however still unable to meet any of B hip / LE strength goals grossly half grade weaker  overall still.  Pt. tolerated all therex well today without pain.     PT Treatment/Interventions Patient/family education;Therapeutic exercise;Manual techniques;Passive range of motion;Taping;Neuromuscular re-education;Balance training;Gait training;Stair training;Cryotherapy;Vasopneumatic Device;Electrical Stimulation;Ultrasound;Iontophoresis 69m/ml Dexamethasone;Therapeutic activities;ADLs/Self Care Home Management   PT Next Visit Plan Proximal LE strengthening; Progress knee strengthening & proprioception training as tolerated;  Modalities PRN for pain/edema      Patient will benefit from skilled therapeutic intervention in order to improve the following deficits and impairments:  Pain, Decreased range of motion, Impaired flexibility, Decreased strength, Difficulty walking, Abnormal gait, Decreased balance, Decreased activity tolerance, Increased edema  Visit Diagnosis: Stiffness of left ankle, not elsewhere classified  Pain in left ankle and joints of left foot  Pain in right knee  Difficulty in walking, not elsewhere classified     Problem List Patient Active Problem List   Diagnosis Date Noted  . Cerebral infarction due to [place]ism of [place] ([place] 05/17/2016  . Essential hypertension 05/17/2016  . Right knee pain 04/23/2016  . Ankle sprain 04/01/2016  . Low back pain 01/27/2016  . Faintness 01/27/2016  . Transient alteration of awareness 01/27/2016  . Poison ivy dermatitis 03/11/2015  . 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 Epilepsy ([facility] 02/24/2015    MBess Harvest PTA 06/02/2016, 10:45 AM  [facility] [place] [address]Phone: [phone]  Fax:  [phone] Name: Allison CassaroMRN: [id number] of Birth: [birth year]

## 2016-06-03 ENCOUNTER — Ambulatory Visit: Payer: Medicare Other | Admitting: Physical Therapy

## 2016-06-03 DIAGNOSIS — M25561 Pain in right knee: Secondary | ICD-10-CM

## 2016-06-03 DIAGNOSIS — M25572 Pain in left ankle and joints of left foot: Secondary | ICD-10-CM

## 2016-06-03 DIAGNOSIS — M25672 Stiffness of left ankle, not elsewhere classified: Secondary | ICD-10-CM

## 2016-06-03 DIAGNOSIS — R262 Difficulty in walking, not elsewhere classified: Secondary | ICD-10-CM | POA: Diagnosis not present

## 2016-06-03 NOTE — Therapy (Signed)
[facility] [address]  [place], [place], [address] Phone: [phone]   Fax:  [phone]  Physical Therapy Treatment  Patient Details  Name: Allison Bass MRN: [id number] Date of Birth: April 30, 1941 Referring Provider: Karlton Lemon, MD  Encounter Date: 06/03/2016      PT End of Session - 06/03/16 1401    Visit Number 8   Number of Visits 12   Date for PT Re-Evaluation 06/24/16   PT Start Time 1401   PT Stop Time 1448   PT Time Calculation (min) 47 min   Activity Tolerance Patient tolerated treatment well;Patient limited by pain   Behavior During Therapy [facility] for tasks assessed/performed      Past Medical History:  Diagnosis Date  . Anxiety   . Chicken pox   . Depression   . Epilepsy ([place])    controlled by Dilantin- last seizure 1995  . GERD (gastroesophageal reflux disease)   . Heart murmur   . HTN (hypertension)   . Migraine   . Seasonal allergies     Past Surgical History:  Procedure Laterality Date  . ABDOMINAL HYSTERECTOMY    . APPENDECTOMY     with hysterectomy  . CATARACT EXTRACTION Bilateral   . LYMPHADENECTOMY     Benign, age 62  . TONSILLECTOMY AND ADENOIDECTOMY      There were no vitals filed for this visit.      Subjective Assessment - 06/03/16 1401    Subjective Pt reports occasional intermittent pain still in L ankle but much less common than in R knee. Notes some increased soreness after last PT visit.   Patient Stated Goals "To be able to walk real well again and cut down on the knee unpredictability."   Currently in Pain? Yes   Pain Score 0-No pain   Pain Location Ankle   Pain Frequency Intermittent   Pain Score 3   Pain Location Knee   Pain Orientation Right   Pain Frequency Intermittent            [facility] PT Assessment - 06/03/16 1401      Assessment   Referring Provider Karlton Lemon, MD   Next MD Visit 06/09/16         Today's  Treatment  TherEx Rec Bike - lvl 2 x 5' Countertop squat x5 - c/o lateral knee pain at ITB insertion  Manual Manual supine ITB stretch 2x30" STM/IASTM with foam roller & beaded roller to R ITB in L sidelying  Kinesiotaping - R knee - 3 "I" strips 1 strip (50%) from tibial tuberosity along ITB to upper thigh    1 strips (30%) medial to patella from tibial tuberosity to lower medial thigh  1 strip (50%) horizontally across inferior patella/patellar tendon extending medially & laterally along joint line  TherEx R ITB stretch with strap 2x30' Bridge + Alternating Hip abd/ER with blue TB x10  B Sidelying Hip clam with blueTB x 10           PT Education - 06/03/16 1440    Education provided Yes   Education Details ITB stretches   Person(s) Educated Patient   Methods Explanation;Demonstration;Handout   Comprehension Verbalized understanding;Returned demonstration;Need further instruction          PT Short Term Goals - 05/18/16 1517      PT SHORT TERM GOAL #1   Title Independent with initial HEP by 05/20/16   Status Achieved  PT Long Term Goals - 06/02/16 1038      PT LONG TERM GOAL #1   Title Independent with advanced HEP as indicated by 06/24/16   Status On-going     PT LONG TERM GOAL #2   Title L ankle ROM WFL without pain by 06/24/16   Status Partially Met  06/01/16: pt. pain free with L ankle ROM with exception of therapist assisted IV.      PT LONG TERM GOAL #3   Title L ankle strength >/= 4/5 by 06/24/16   Status Achieved     PT LONG TERM GOAL #4   Title B hip and knee strength >/= 4+/5 by 06/24/16   Status On-going     PT LONG TERM GOAL #5   Title Pt will ambulate with normal gait pattern w/o ankle brace by 06/24/16   Status On-going               Plan - 06/03/16 1448    Clinical Impression Statement Pt noting increased R lateral knee pain today at ITB insertion with testing revealing postive Ober's and significant tightness in R  ITB with localized tenderness in mid ITB upon palpation. Instructed pt in stretching for ITB and worked on STM and taping to reduce pain/tension in ITB.   PT Treatment/Interventions Patient/family education;Therapeutic exercise;Manual techniques;Passive range of motion;Taping;Neuromuscular re-education;Balance training;Gait training;Stair training;Cryotherapy;Vasopneumatic Device;Electrical Stimulation;Ultrasound;Iontophoresis 2m/ml Dexamethasone;Therapeutic activities;ADLs/Self Care Home Management   PT Next Visit Plan MD note for appt on 06/09/16; Review ITB stretches with manual STM as needed; Proximal LE strengthening; Progress knee strengthening & proprioception training as tolerated;  Modalities PRN for pain/edema   Consulted and Agree with Plan of Care Patient      Patient will benefit from skilled therapeutic intervention in order to improve the following deficits and impairments:  Pain, Decreased range of motion, Impaired flexibility, Decreased strength, Difficulty walking, Abnormal gait, Decreased balance, Decreased activity tolerance, Increased edema  Visit Diagnosis: Stiffness of left ankle, not elsewhere classified  Pain in left ankle and joints of left foot  Pain in right knee  Difficulty in walking, not elsewhere classified     Problem List Patient Active Problem List   Diagnosis Date Noted  . 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 Essential hypertension 05/17/2016  . Right knee pain 04/23/2016  . Ankle sprain 04/01/2016  . Low back pain 01/27/2016  . Faintness 01/27/2016  . Transient alteration of awareness 01/27/2016  . Poison ivy dermatitis 03/11/2015  . 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 Epilepsy ([place] 02/24/2015    JPercival Spanish PT, MPT 06/03/2016, 6:59 PM  [facility] [place] [address]Phone: [phone]  Fax:   [phone] Name: Allison MehrerMRN: [id number] of Birth: [birth year]

## 2016-06-06 ENCOUNTER — Encounter: Payer: Self-pay | Admitting: Neurology

## 2016-06-08 ENCOUNTER — Ambulatory Visit: Payer: Medicare Other

## 2016-06-08 DIAGNOSIS — M25672 Stiffness of left ankle, not elsewhere classified: Secondary | ICD-10-CM

## 2016-06-08 DIAGNOSIS — R262 Difficulty in walking, not elsewhere classified: Secondary | ICD-10-CM | POA: Diagnosis not present

## 2016-06-08 DIAGNOSIS — M25572 Pain in left ankle and joints of left foot: Secondary | ICD-10-CM | POA: Diagnosis not present

## 2016-06-08 DIAGNOSIS — M25561 Pain in right knee: Secondary | ICD-10-CM | POA: Diagnosis not present

## 2016-06-08 NOTE — Therapy (Signed)
[facility] [address]  [place], [place], [address] Phone: [phone]   Fax:  [phone]  Physical Therapy Treatment  Patient Details  Name: Allison Bass MRN: [id number] Date of Birth: [birth year] Referring Provider: Karlton Lemon, MD  Encounter Date: 06/08/2016      PT End of Session - 06/08/16 1425    Visit Number 9   Number of Visits 12   Date for PT Re-Evaluation 06/24/16   PT Start Time 1401   PT Stop Time 1455   PT Time Calculation (min) 54 min   Activity Tolerance Patient tolerated treatment well   Behavior During Therapy [facility] for tasks assessed/performed      Past Medical History:  Diagnosis Date  . Anxiety   . Chicken pox   . Depression   . Epilepsy ([place])    controlled by Dilantin- last seizure 1995  . GERD (gastroesophageal reflux disease)   . Heart murmur   . HTN (hypertension)   . Migraine   . Seasonal allergies     Past Surgical History:  Procedure Laterality Date  . ABDOMINAL HYSTERECTOMY    . APPENDECTOMY     with hysterectomy  . CATARACT EXTRACTION Bilateral   . LYMPHADENECTOMY     Benign, age 28  . TONSILLECTOMY AND ADENOIDECTOMY      There were no vitals filed for this visit.      Subjective Assessment - 06/08/16 1410    Subjective Pt. reports the ITB stretch and taping to R knee has helped since it was added last treatment.  Pt. reports 2/10 R sided back pain initially today    Patient Stated Goals "To be able to walk real well again and cut down on the knee unpredictability."   Currently in Pain? Yes   Pain Score 2    Pain Location Back   Pain Orientation Right   Pain Descriptors / Indicators Tightness;Stabbing   Pain Type Acute pain   Pain Onset More than a month ago   Pain Frequency Intermittent   Multiple Pain Sites No            [facility] PT Assessment - 06/08/16 1433      Strength   Strength Assessment Site Hip;Knee   Right/Left Hip Right;Left   Right Hip Flexion 4/5   Right Hip Extension 4-/5   Right Hip ABduction 4+/5   Right Hip ADduction 4/5   Left Hip Flexion 4/5   Left Hip Extension 4/5   Left Hip ABduction 4+/5   Left Hip ADduction 4+/5   Right/Left Knee Right;Left   Right Knee Flexion 4/5   Right Knee Extension 4/5   Left Knee Flexion 4/5   Left Knee Extension 5/5   Right/Left Ankle Right;Left   Right Ankle Dorsiflexion 5/5   Right Ankle Plantar Flexion 5/5   Right Ankle Inversion 5/5   Right Ankle Eversion 5/5   Left Ankle Dorsiflexion 5/5   Left Ankle Plantar Flexion 4/5   Left Ankle Inversion 4+/5   Left Ankle Eversion 4+/5       Today's Treatment  TherEx: Rec Bike - lvl 2x 5' R sidelying L hip abduction 2 x 15 reps  L sidelying L hip adduction x 15 reps  Manual: Manual supine ITB stretch 2x30" Manual HS, glute, SKTC stretch x 30 sec   Goal testing  MMT testing   Modalities: Moist heat to thoracic / lumbar spine: hooklying, 12'   Kinesiotaping - R knee -  3 "I" strips 1 strip (50%) horizontally across inferior patella/patellar tendon extending medially & laterally along joint line * other taping strips still intact        PT Short Term Goals - 05/18/16 1517      PT SHORT TERM GOAL #1   Title Independent with initial HEP by 05/20/16   Status Achieved           PT Long Term Goals - 06/08/16 1428      PT LONG TERM GOAL #1   Title Independent with advanced HEP as indicated by 06/24/16   Status On-going     PT LONG TERM GOAL #2   Title L ankle ROM WFL without pain by 06/24/16   Status Partially Met  06/01/16: pt. pain free with L ankle ROM with exception of therapist assisted IV.      PT LONG TERM GOAL #3   Title L ankle strength >/= 4/5 by 06/24/16   Status Achieved     PT LONG TERM GOAL #4   Title B hip and knee strength >/= 4+/5 by 06/24/16   Status On-going     PT LONG TERM GOAL #5   Title Pt will ambulate with normal gait pattern w/o ankle brace by 06/24/16   Status  On-going               Plan - 06/08/16 1425    Clinical Impression Statement Pt. reports the ITB stretch and taping to R knee has helped since it was added last treatment.  Pt. reports 2/10 R sided back pain initially today however pain free in R knee and L ankle.  Pt. still grossly 4/5 with all hip / knee strength, however 4+/5 or > with L ankle strength.  Pt. progressing well at this point with appointment to see MD on 8/16.    PT Treatment/Interventions Patient/family education;Therapeutic exercise;Manual techniques;Passive range of motion;Taping;Neuromuscular re-education;Balance training;Gait training;Stair training;Cryotherapy;Vasopneumatic Device;Electrical Stimulation;Ultrasound;Iontophoresis 41m/ml Dexamethasone;Therapeutic activities;ADLs/Self Care Home Management   PT Next Visit Plan Manual STM to ITB as needed; Proximal LE strengthening; Progress knee strengthening & proprioception training as tolerated;  Modalities PRN for pain/edema      Patient will benefit from skilled therapeutic intervention in order to improve the following deficits and impairments:  Pain, Decreased range of motion, Impaired flexibility, Decreased strength, Difficulty walking, Abnormal gait, Decreased balance, Decreased activity tolerance, Increased edema  Visit Diagnosis: Stiffness of left ankle, not elsewhere classified  Pain in left ankle and joints of left foot  Pain in right knee  Difficulty in walking, not elsewhere classified     Problem List Patient Active Problem List   Diagnosis Date Noted  . 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 Essential hypertension 05/17/2016  . Right knee pain 04/23/2016  . Ankle sprain 04/01/2016  . Low back pain 01/27/2016  . Faintness 01/27/2016  . Transient alteration of awareness 01/27/2016  . Poison ivy dermatitis 03/11/2015  . 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 Epilepsy ([facility] 02/24/2015    MBess Harvest PTA 06/08/2016, 3:19 PM  [facility] [place] [address]Phone: [phone]  Fax:  [phone] Name: Allison SchlotzhauerMRN: [id number] of Birth: [birth year]

## 2016-06-09 ENCOUNTER — Ambulatory Visit (INDEPENDENT_AMBULATORY_CARE_PROVIDER_SITE_OTHER): Payer: Medicare Other | Admitting: Family Medicine

## 2016-06-09 ENCOUNTER — Encounter: Payer: Self-pay | Admitting: Family Medicine

## 2016-06-09 DIAGNOSIS — M25561 Pain in right knee: Secondary | ICD-10-CM | POA: Diagnosis not present

## 2016-06-09 DIAGNOSIS — S93402D Sprain of unspecified ligament of left ankle, subsequent encounter: Secondary | ICD-10-CM | POA: Diagnosis present

## 2016-06-09 DIAGNOSIS — I63112 Cerebral infarction due to embolism of left vertebral artery: Secondary | ICD-10-CM | POA: Diagnosis not present

## 2016-06-09 NOTE — Patient Instructions (Signed)
Finish your physical therapy and transition to a home program. I would indefinitely continue quad and hamstring strengthening for your right leg. Use ankle brace only if needed. Follow up with [place] as needed.

## 2016-06-10 ENCOUNTER — Ambulatory Visit: Payer: Medicare Other | Admitting: Physical Therapy

## 2016-06-10 DIAGNOSIS — R262 Difficulty in walking, not elsewhere classified: Secondary | ICD-10-CM

## 2016-06-10 DIAGNOSIS — M25672 Stiffness of left ankle, not elsewhere classified: Secondary | ICD-10-CM | POA: Diagnosis not present

## 2016-06-10 DIAGNOSIS — M25572 Pain in left ankle and joints of left foot: Secondary | ICD-10-CM

## 2016-06-10 DIAGNOSIS — M25561 Pain in right knee: Secondary | ICD-10-CM

## 2016-06-10 NOTE — Therapy (Addendum)
[facility] [address]  [place], [place], [address] Phone: [phone]   Fax:  [phone]  Physical Therapy Treatment  Patient Details  Name: Allison Bass MRN: [id number] Date of Birth: 1941-07-13 Referring Provider: Karlton Lemon, MD  Encounter Date: 06/10/2016      PT End of Session - 06/10/16 1402    Visit Number 10   Number of Visits 12   Date for PT Re-Evaluation 06/24/16   PT Start Time 7096   PT Stop Time 1512   PT Time Calculation (min) 70 min   Activity Tolerance Patient tolerated treatment well   Behavior During Therapy [facility] for tasks assessed/performed      Past Medical History:  Diagnosis Date  . Anxiety   . Chicken pox   . Depression   . Epilepsy ([place])    controlled by Dilantin- last seizure 1995  . GERD (gastroesophageal reflux disease)   . Heart murmur   . HTN (hypertension)   . Migraine   . Seasonal allergies     Past Surgical History:  Procedure Laterality Date  . ABDOMINAL HYSTERECTOMY    . APPENDECTOMY     with hysterectomy  . CATARACT EXTRACTION Bilateral   . LYMPHADENECTOMY     Benign, age 75  . TONSILLECTOMY AND ADENOIDECTOMY      There were no vitals filed for this visit.      Subjective Assessment - 06/10/16 1400    Subjective Pt saw MD yesterday and states he told her she could transition to a HEP at this point.   Patient Stated Goals "To be able to walk real well again and cut down on the knee unpredictability."   Currently in Pain? No/denies   Pain Score 0-No pain   Pain Onset More than a month ago            [facility] PT Assessment - 06/10/16 1402      Observation/Other Assessments   Focus on Therapeutic Outcomes (FOTO)  Foot 64% (36% limitation)     AROM   AROM Assessment Site Knee;Ankle   Right Knee Extension 0   Right Knee Flexion 130   Right Ankle Dorsiflexion 8   Right Ankle Plantar Flexion 58   Right Ankle Inversion 34   Right Ankle  Eversion 20   Left Ankle Dorsiflexion 10   Left Ankle Plantar Flexion 56   Left Ankle Inversion 23   Left Ankle Eversion 14     Strength   Strength Assessment Site Hip;Knee;Ankle   Right Hip Flexion 4/5   Right Hip Extension 4-/5   Right Hip ABduction 4+/5   Right Hip ADduction 4/5   Left Hip Flexion 4/5   Left Hip Extension 4/5   Left Hip ABduction 4+/5   Left Hip ADduction 4+/5   Right Knee Flexion 4+/5   Right Knee Extension 4+/5   Left Knee Flexion 4+/5   Left Knee Extension 5/5   Right Ankle Dorsiflexion 5/5   Right Ankle Plantar Flexion 5/5   Right Ankle Inversion 5/5   Right Ankle Eversion 5/5   Left Ankle Dorsiflexion 5/5   Left Ankle Plantar Flexion 4+/5   Left Ankle Inversion 4+/5   Left Ankle Eversion 4+/5           Today's Treatment  TherEx Rec Bike - lvl 3x 5'  Self-care Instruction in Kinesiotape application - R knee - 3 "I" strips 1 strip (50%) from tibial tuberosity along ITB to  upper thigh    1 strips (30%) medial to patella from tibial tuberosity to lower medial thigh  1 strip (50%) horizontally across inferior patella/patellar tendon extending medially & laterally along joint line  TherEx B Standing Gastroc & Soleus stretches 2x30" Instruction in alternative method for Ankle inversion with TB (tethering TB in door) R ITB stretch with strap 2x30' Bridge + Alternating Hip abd/ER with blue TB x10 B Sidelying Hip clam with blueTB x 10 Countertop squat x10  Standing 3 way Hip with green TB loop           PT Education - 06/10/16 1512    Education provided Yes   Education Details HEP update/clarification; Instruction in kinesiotape application   Person(s) Educated Patient   Methods Explanation;Demonstration;Handout   Comprehension Verbalized understanding;Returned demonstration          PT Short Term Goals - 05/18/16 1517      PT SHORT TERM GOAL #1   Title Independent with initial HEP by 05/20/16   Status Achieved            PT Long Term Goals - 06/10/16 1454      PT LONG TERM GOAL #1   Title Independent with advanced HEP as indicated by 06/24/16   Status Achieved     PT LONG TERM GOAL #2   Title L ankle ROM WFL without pain by 06/24/16   Status Achieved     PT LONG TERM GOAL #3   Title L ankle strength >/= 4/5 by 06/24/16   Status Achieved     PT LONG TERM GOAL #4   Title B hip and knee strength >/= 4+/5 by 06/24/16   Status Partially Met  Met for knees, but hips 4/5 to 4+/5 with exception of R hip extension 4-/5     PT LONG TERM GOAL #5   Title Pt will ambulate with normal gait pattern w/o ankle brace by 06/24/16   Status Achieved               Plan - 06/10/16 1510    Clinical Impression Statement Pt reports MD very pleased with her progress for her L ankle and agrees with ITB involvement with R knee pain. Pt states MD instructed her to transition to the HEP after today's visit, therefore reviewed/clarified plan for continued HEP with pt acknowledging understanding. Also provided instruction for self-application of kinesiotape with 1 spare set of strips provided to use as template for future applications. Pt has met all goals excepting B hip strength only 4/5 to 4+/5 with R hip extension 4-/5, but acknowledges understanding of relevant exercises to continue strengthening this area. Will place pt on hold for 30 days in case issues arise with HEP, and if no need to return, will proceed with discharge.   PT Treatment/Interventions Patient/family education;Therapeutic exercise;Manual techniques;Passive range of motion;Taping;Neuromuscular re-education;Balance training;Gait training;Stair training;Cryotherapy;Vasopneumatic Device;Electrical Stimulation;Ultrasound;Iontophoresis 67m/ml Dexamethasone;Therapeutic activities;ADLs/Self Care Home Management   PT Next Visit Plan 30 day hold; recert required if she returns after 06/24/16   Consulted and Agree with Plan of Care Patient      Patient  will benefit from skilled therapeutic intervention in order to improve the following deficits and impairments:  Pain, Decreased range of motion, Impaired flexibility, Decreased strength, Difficulty walking, Abnormal gait, Decreased balance, Decreased activity tolerance, Increased edema  Visit Diagnosis: Stiffness of left ankle, not elsewhere classified  Pain in left ankle and joints of left foot  Pain in right knee  Difficulty in walking, not  elsewhere classified       G-Codes - 23-Jun-2016 1521    Functional Assessment Tool Used Foot FOTO = 64% (36% limitation)   Functional Limitation Mobility: Walking and moving around   Mobility: Walking and Moving Around Current Status [phone]) At least 20 percent but less than 40 percent impaired, limited or restricted   Mobility: Walking and Moving Around Goal Status [phone]) At least 40 percent but less than 60 percent impaired, limited or restricted   Mobility: Walking and Moving Around Discharge Status [phone]) At least 20 percent but less than 40 percent impaired, limited or restricted      Problem List Patient Active Problem List   Diagnosis Date Noted  . 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 Essential hypertension 05/17/2016  . Right knee pain 04/23/2016  . Ankle sprain 04/01/2016  . Low back pain 01/27/2016  . Faintness 01/27/2016  . Transient alteration of awareness 01/27/2016  . Poison ivy dermatitis 03/11/2015  . 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 Epilepsy ([place]) 02/24/2015    Percival Spanish, PT, MPT 06/23/16, 6:49 PM  [facility] [address]  [place], [place], [address] Phone: [phone]   Fax:  [phone]  Name: Allison Bass MRN: [id number] Date of Birth: 1941-08-17   PHYSICAL THERAPY DISCHARGE SUMMARY  Visits from Start of Care: 10  Current functional level related to goals / functional  outcomes:    Refer to above clinical impression. Pt has not needed to return to PT in >30 days, therefore will proceed with D/C from PT for this episode.   Remaining deficits:    As above. Pt to continue with HEP.   Education / Equipment:    HEP, instruction in kinesiotaping  Plan: Patient agrees to discharge.  Patient goals were partially met. Patient is being discharged due to being pleased with the current functional level.  ?????    Percival Spanish, PT, MPT 07/12/16, 4:38 PM  [facility] [address]  [place], [place], [address] Phone: [phone]   Fax:  [phone]

## 2016-06-14 NOTE — Assessment & Plan Note (Signed)
known ACL tear.  Continue strengthening - transition from PT to home exercise program.  F/u prn.

## 2016-06-14 NOTE — Progress Notes (Signed)
PCP: Ann Held, DO  Subjective:   HPI: Patient is a 75 y.o. female here for left ankle, right knee pain.  6/30: Patient reports on 6/2 she was at a wedding coming down some steps when her right knee buckled and she accidentally inverted her left ankle. Immediate pain, inability to bear weight. Had a lot of swelling. Went to ED same day - radiographs negative for fracture of ankle.  Radiographs of right knee with tiny loose body likely from arthropathy but no acute fracture - I am unable to pull up the images. 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 Written for physical therapy but hasn't started this and would like to go downstairs as more convenient for her. She tore right ACL in 1999 but did not have any surgical intervention for this. Occasionally gives way on her. No catching, locking. Using crutches because of ankle. Still swelling but has improved. Pain is 2/10 at rest, up to 7/10 with ambulation, sharp. No skin changes, numbness.  7/14: Patient reports she feels completely better. Did a week of physical therapy. Wearing ASO. Doing home exercises. Right knee still feels a little weak. Pain level 0/10. No skin changes, numbness.  8/16: Patient reports she is doing very well. Right knee still feels like it will give out at times. Occasional pain but now 0/10 pain. Continuing to strengthen knee. No problems with ankle. No skin changes, numbness.  Past Medical History:  Diagnosis Date  . Anxiety   . Chicken pox   . Depression   . 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 GERD (gastroesophageal reflux disease)   . Heart murmur   . HTN (hypertension)   . Migraine   . Seasonal allergies     Current Outpatient Prescriptions on File Prior to Visit  Medication Sig Dispense Refill  . ALPRAZolam (XANAX) 0.5 MG tablet TAKE ONE TABLET BY MOUTH THREE TIMES DAILY 90 tablet 0  . atorvastatin  (LIPITOR) 40 MG tablet Take 1 tablet (40 mg total) by mouth daily. 30 tablet 3  . cyclobenzaprine (FLEXERIL) 10 MG tablet Take 1 tablet (10 mg total) by mouth 3 (three) times daily as needed for muscle spasms. 30 tablet 0  . desvenlafaxine (PRISTIQ) 100 MG 24 hr tablet Take 1 tablet (100 mg total) by mouth daily. 90 tablet 3  . DILANTIN 100 MG ER capsule Take 3 capsules (300 mg total) by mouth daily. 270 capsule 3  . hydrochlorothiazide (HYDRODIURIL) 50 MG tablet TAKE 1 TABLET(50 MG) BY MOUTH DAILY (Patient taking differently: Take 1/2 tablet daily) 90 tablet 1  . HYDROcodone-acetaminophen (NORCO) 5-325 MG tablet Take 1 tablet by mouth every 6 (six) hours as needed for moderate pain. 40 tablet 0  . lisinopril (PRINIVIL,ZESTRIL) 10 MG tablet Take 1 tablet (10 mg total) by mouth daily. 90 tablet 3  . potassium chloride (K-DUR,KLOR-CON) 10 MEQ tablet TAKE 2 TABLETS BY MOUTH EVERY MORNING AND EVERY EVENING (Patient taking differently: TAKE 2 TABLETS BY MOUTH EVERY MORNING AND 2 TABLETS EVERY EVENING) 360 tablet 1  . PREMARIN 1.25 MG tablet Take 1 tablet (1.25 mg total) by mouth daily. 90 tablet 3   No current facility-administered medications on file prior to visit.     Past Surgical History:  Procedure Laterality Date  . ABDOMINAL HYSTERECTOMY    . APPENDECTOMY     with hysterectomy  . CATARACT EXTRACTION Bilateral   . LYMPHADENECTOMY  Benign, age 48  . TONSILLECTOMY AND ADENOIDECTOMY      Allergies  Allergen Reactions  . Erythromycin   . Codeine Rash    Makes the patient walk into walls    Social History   Social History  . Marital status: Widowed    Spouse name: N/A  . Number of children: 3  . Years of education: N/A   Occupational History  . Retired     retired   Social History Main Topics  . Smoking status: Never Smoker  . Smokeless tobacco: Never Used  . Alcohol use 0.0 oz/week     Comment: Occ  . Drug use: No  . Sexual activity: Not on file   Other Topics  Concern  . Not on file   Social History Narrative  . No narrative on file    Family History  Problem Relation Age of Onset  . Alcoholism Father   . Lung cancer Father     Smoker  . Stroke Father   . High blood pressure Father   . Breast cancer Maternal Grandmother   . Heart disease Mother   . High blood pressure Mother   . Depression Mother   . Seizures Mother   . Colon cancer Neg Hx   . Esophageal cancer Neg Hx   . Stomach cancer Neg Hx   . Rectal cancer Neg Hx     BP 131/78   Pulse 79   Ht 5\' 6"  (1.676 m)   Wt 150 lb (68 kg)   BMI 24.21 kg/m   Review of Systems: See HPI above.    Objective:  Physical Exam:  Gen: NAD, comfortable in exam room  Right knee: No gross deformity, ecchymoses, effusion. Minimal lateral joint line tenderness.  No other tenderness. FROM. 1+ ant drawer, 1+ lachmanns.  Negative post drawer.  Negative valgus/varus testing. Negative mcmurrays, apleys, patellar apprehension. NV intact distally.  Left ankle/foot: Minimal lateral swelling.  No bruising, deformity. FROM No tenderness.  Trace ant drawer, negative talar tilt.   Negative syndesmotic compression. Thompsons test negative. NV intact distally.    Assessment & Plan:  1. Left ankle sprain- Grade 3.  No issues now.  Transition to home exercise program.  ASO only if needed.  Icing, tylenol if needed.    2. Right knee pain, instability - known ACL tear.  Continue strengthening - transition from PT to home exercise program.  F/u prn.

## 2016-06-14 NOTE — Assessment & Plan Note (Signed)
Grade 3.  No issues now.  Transition to home exercise program.  ASO only if needed.  Icing, tylenol if needed.

## 2016-06-21 ENCOUNTER — Other Ambulatory Visit: Payer: Self-pay | Admitting: Family Medicine

## 2016-06-21 NOTE — Telephone Encounter (Signed)
Last Alprazolam Rx: 05/20/16, #90 x no refills Last OV: 03/2016 Next OV: not scheduled UDS: no previous UDS or CSC on file. Please advise request?

## 2016-06-22 ENCOUNTER — Encounter: Payer: Self-pay | Admitting: Family Medicine

## 2016-06-23 ENCOUNTER — Other Ambulatory Visit: Payer: Self-pay | Admitting: Family Medicine

## 2016-06-23 ENCOUNTER — Other Ambulatory Visit: Payer: Self-pay | Admitting: Neurology

## 2016-06-23 NOTE — Telephone Encounter (Signed)
Patient calling stating she needs refill on potassium chloride and atorvastatin

## 2016-06-24 ENCOUNTER — Encounter: Payer: Self-pay | Admitting: Neurology

## 2016-06-24 ENCOUNTER — Other Ambulatory Visit: Payer: Self-pay

## 2016-06-24 NOTE — Telephone Encounter (Signed)
Spoke with patient regarding medication. She voices understanding that she need to contact the provider that prescribed her the medication that she is requesting.  Thanks----PC

## 2016-07-02 ENCOUNTER — Encounter: Payer: Self-pay | Admitting: Family Medicine

## 2016-07-07 ENCOUNTER — Other Ambulatory Visit: Payer: Self-pay | Admitting: Family Medicine

## 2016-07-08 ENCOUNTER — Encounter: Payer: Self-pay | Admitting: Family Medicine

## 2016-07-08 ENCOUNTER — Ambulatory Visit (HOSPITAL_BASED_OUTPATIENT_CLINIC_OR_DEPARTMENT_OTHER)
Admission: RE | Admit: 2016-07-08 | Discharge: 2016-07-08 | Disposition: A | Discharge status: Home / Self Care | Payer: Medicare Other | Source: Ambulatory Visit | Attending: Family Medicine | Admitting: Family Medicine

## 2016-07-08 ENCOUNTER — Ambulatory Visit (INDEPENDENT_AMBULATORY_CARE_PROVIDER_SITE_OTHER): Payer: Medicare Other | Admitting: Family Medicine

## 2016-07-08 VITALS — BP 120/73 | HR 77 | Ht 65.0 in | Wt 150.0 lb

## 2016-07-08 DIAGNOSIS — I63112 Cerebral infarction due to embolism of left vertebral artery: Secondary | ICD-10-CM | POA: Diagnosis not present

## 2016-07-08 DIAGNOSIS — X58XXXA Exposure to other specified factors, initial encounter: Secondary | ICD-10-CM | POA: Insufficient documentation

## 2016-07-08 DIAGNOSIS — M79672 Pain in left foot: Secondary | ICD-10-CM | POA: Diagnosis not present

## 2016-07-08 DIAGNOSIS — S99922A Unspecified injury of left foot, initial encounter: Secondary | ICD-10-CM | POA: Diagnosis not present

## 2016-07-08 DIAGNOSIS — S9782XA Crushing injury of left foot, initial encounter: Secondary | ICD-10-CM | POA: Diagnosis not present

## 2016-07-08 DIAGNOSIS — M25572 Pain in left ankle and joints of left foot: Secondary | ICD-10-CM | POA: Diagnosis not present

## 2016-07-08 DIAGNOSIS — S99912A Unspecified injury of left ankle, initial encounter: Secondary | ICD-10-CM | POA: Insufficient documentation

## 2016-07-08 MED ORDER — HYDROCODONE-ACETAMINOPHEN 5-325 MG PO TABS: 1.0000 | ORAL_TABLET | Freq: Four times a day (QID) | ORAL | 0 refills | Status: DC | PRN

## 2016-07-08 NOTE — Patient Instructions (Signed)
Your x-rays are normal. You have a proximal foot contusion. Ice the area 15 minutes at a time 3-4 times a day. Norco as needed for severe pain - we cannot refill this though. You can take tylenol instead of the norco also (but don't take them together). Capsaicin, biofreeze, or aspercreme topically up to 4 times a day. Follow up with me as needed. These usually take 2-3 weeks to resolve.

## 2016-07-12 ENCOUNTER — Other Ambulatory Visit: Payer: Self-pay

## 2016-07-12 MED ORDER — HYDROCHLOROTHIAZIDE 50 MG PO TABS: ORAL_TABLET | ORAL | 1 refills | Status: DC

## 2016-07-12 MED ORDER — DILANTIN 100 MG PO CAPS: 300.0000 mg | ORAL_CAPSULE | Freq: Every day | ORAL | 1 refills | Status: DC

## 2016-07-13 DIAGNOSIS — S99912A Unspecified injury of left ankle, initial encounter: Secondary | ICD-10-CM | POA: Insufficient documentation

## 2016-07-13 DIAGNOSIS — S99922A Unspecified injury of left foot, initial encounter: Secondary | ICD-10-CM

## 2016-07-13 NOTE — Assessment & Plan Note (Signed)
independently reviewed radiographs and no evidence fracture.  2/2 contusion.  Icing, norco as needed for severe pain.  Topical medications discussed as well.  F/u prn.  Expect 2-3 weeks to resolve.

## 2016-07-13 NOTE — Progress Notes (Signed)
PCP: Ann Held, DO  Subjective:   HPI: Patient is a 75 y.o. female here for left foot injury.  Patient reports she was carrying a cabinet on 9/8 when she accidentally dropped the door of this onto her left foot proximally. Pain level is 4/10, up to 9/10 by end of day, sharp, worse with walking. Has been throbbing. Initially had bruising and swelling - this is improving. No skin changes.  Some tingling but no numbness.  Past Medical History:  Diagnosis Date  . Anxiety   . Chicken pox   . Depression   . 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 GERD (gastroesophageal reflux disease)   . Heart murmur   . HTN (hypertension)   . Migraine   . Seasonal allergies     Current Outpatient Prescriptions on File Prior to Visit  Medication Sig Dispense Refill  . ALPRAZolam (XANAX) 0.5 MG tablet TAKE ONE TABLET BY MOUTH THREE TIMES DAILY 90 tablet 0  . atorvastatin (LIPITOR) 40 MG tablet TAKE ONE TABLET BY MOUTH ONCE DAILY 30 tablet 3  . cyclobenzaprine (FLEXERIL) 10 MG tablet Take 1 tablet (10 mg total) by mouth 3 (three) times daily as needed for muscle spasms. 30 tablet 0  . desvenlafaxine (PRISTIQ) 100 MG 24 hr tablet Take 1 tablet (100 mg total) by mouth daily. 90 tablet 3  . gabapentin (NEURONTIN) 300 MG capsule     . KLOR-CON M10 10 MEQ tablet TAKE TWO TABLETS BY MOUTH IN THE MORNING AND TWO IN THE EVENING 360 tablet 1  . lisinopril (PRINIVIL,ZESTRIL) 10 MG tablet Take 1 tablet (10 mg total) by mouth daily. 90 tablet 3  . PREMARIN 1.25 MG tablet Take 1 tablet (1.25 mg total) by mouth daily. 90 tablet 3   No current facility-administered medications on file prior to visit.     Past Surgical History:  Procedure Laterality Date  . ABDOMINAL HYSTERECTOMY    . APPENDECTOMY     with hysterectomy  . CATARACT EXTRACTION Bilateral   . LYMPHADENECTOMY     Benign, age 46  . TONSILLECTOMY AND ADENOIDECTOMY      Allergies  Allergen Reactions  .  Erythromycin   . Codeine Rash    Makes the patient walk into walls    Social History   Social History  . Marital status: Widowed    Spouse name: N/A  . Number of children: 3  . Years of education: N/A   Occupational History  . Retired     retired   Social History Main Topics  . Smoking status: Never Smoker  . Smokeless tobacco: Never Used  . Alcohol use 0.0 oz/week     Comment: Occ  . Drug use: No  . Sexual activity: Not on file   Other Topics Concern  . Not on file   Social History Narrative  . No narrative on file    Family History  Problem Relation Age of Onset  . Alcoholism Father   . Lung cancer Father     Smoker  . Stroke Father   . High blood pressure Father   . Breast cancer Maternal Grandmother   . Heart disease Mother   . High blood pressure Mother   . Depression Mother   . Seizures Mother   . Colon cancer Neg Hx   . Esophageal cancer Neg Hx   . Stomach cancer Neg Hx   . Rectal cancer Neg Hx  BP 120/73   Pulse 77   Ht 5\' 5"  (1.651 m)   Wt 150 lb (68 kg)   BMI 24.96 kg/m   Review of Systems: See HPI above.    Objective:  Physical Exam:  Gen: NAD, comfortable in exam room  Left foot/ankle: Mild swelling, bruising dorsal proximal foot.  No other deformity. FROM ankle without pain. TTP proximal dorsal foot, anterior ankle joint.  No other tenderness. Negative ant drawer and talar tilt.   Negative syndesmotic compression. Thompsons test negative. NV intact distally.  Right foot/ankle: FROM without pain.    Assessment & Plan:  1. Left ankle/foot injury - independently reviewed radiographs and no evidence fracture.  2/2 contusion.  Icing, norco as needed for severe pain.  Topical medications discussed as well.  F/u prn.  Expect 2-3 weeks to resolve.

## 2016-07-27 ENCOUNTER — Telehealth: Payer: Self-pay | Admitting: Family Medicine

## 2016-07-27 NOTE — Telephone Encounter (Signed)
Patient stated that she took an Excedrin Migraine and her migraine is almost gone.  She is just letting you know.

## 2016-07-27 NOTE — Telephone Encounter (Signed)
Please advise      KP 

## 2016-07-27 NOTE — Telephone Encounter (Signed)
She can also -- try flonase ( or rhinocort or nasacort) and an antihistamine (zyrtec, allergra, claritin 0r xyzal)--- it may help with the sinus part.

## 2016-07-27 NOTE — Telephone Encounter (Signed)
Patient has been made aware and verbalized understanding, she said will try and call back if no relief.      KP

## 2016-07-27 NOTE — Telephone Encounter (Signed)
Pt called in to be advised. She says that she has had a headache for 4 days. She says that she realized today that it is a sinus headache and would like to know if the provider could suggest something that she could take.    Please advise.   CB: [id number]

## 2016-08-01 ENCOUNTER — Encounter: Payer: Self-pay | Admitting: Family Medicine

## 2016-08-02 ENCOUNTER — Telehealth: Payer: Self-pay | Admitting: Family Medicine

## 2016-08-02 MED ORDER — ALPRAZOLAM 0.5 MG PO TABS: 0.5000 mg | ORAL_TABLET | Freq: Three times a day (TID) | ORAL | 0 refills | Status: DC

## 2016-08-02 NOTE — Telephone Encounter (Signed)
Pharmacy requesting a refill on ALPRAZolam (XANAX) 0.5 MG tablet for patient. Please advise.   Pharmacy: [facility] [address] [place]   Fax: [phone] Phone: [phone]

## 2016-08-02 NOTE — Telephone Encounter (Signed)
Rx faxed.    KP 

## 2016-08-02 NOTE — Telephone Encounter (Signed)
Xanax requested Last seen 04/20/2016 and filled 06/21/2016 #90 No UDS [place] patient   Please advise     KP

## 2016-09-02 ENCOUNTER — Encounter: Payer: Self-pay | Admitting: Family Medicine

## 2016-09-02 ENCOUNTER — Ambulatory Visit (INDEPENDENT_AMBULATORY_CARE_PROVIDER_SITE_OTHER): Payer: Medicare Other | Admitting: Licensed Clinical Social Worker

## 2016-09-02 DIAGNOSIS — F331 Major depressive disorder, recurrent, moderate: Secondary | ICD-10-CM

## 2016-09-03 ENCOUNTER — Encounter: Payer: Self-pay | Admitting: Family Medicine

## 2016-09-04 ENCOUNTER — Encounter: Payer: Self-pay | Admitting: Family Medicine

## 2016-09-06 ENCOUNTER — Encounter: Payer: Self-pay | Admitting: Neurology

## 2016-09-06 ENCOUNTER — Ambulatory Visit (INDEPENDENT_AMBULATORY_CARE_PROVIDER_SITE_OTHER): Payer: Medicare Other | Admitting: Neurology

## 2016-09-06 ENCOUNTER — Other Ambulatory Visit: Payer: Self-pay | Admitting: Family Medicine

## 2016-09-06 VITALS — BP 124/72 | HR 87 | Ht 65.0 in | Wt 153.1 lb

## 2016-09-06 DIAGNOSIS — F321 Major depressive disorder, single episode, moderate: Secondary | ICD-10-CM

## 2016-09-06 DIAGNOSIS — I63112 Cerebral infarction due to embolism of left vertebral artery: Secondary | ICD-10-CM

## 2016-09-06 DIAGNOSIS — F329 Major depressive disorder, single episode, unspecified: Secondary | ICD-10-CM

## 2016-09-06 DIAGNOSIS — F32A Depression, unspecified: Secondary | ICD-10-CM

## 2016-09-06 DIAGNOSIS — G40009 Localization-related (focal) (partial) idiopathic epilepsy and epileptic syndromes with seizures of localized onset, not intractable, without status epilepticus: Secondary | ICD-10-CM

## 2016-09-06 MED ORDER — VILAZODONE HCL 10 & 20 MG PO KIT: 10.0000 mg | PACK | Freq: Every day | ORAL | 0 refills | Status: DC

## 2016-09-06 NOTE — Patient Instructions (Addendum)
1. Continue daily aspirin, control of blood pressure, cholesterol 2. Continue with seeing your counselor 3. Discuss medications with Dr. Etter Sjogren 4. Follow-up in 6 months, call for any changes

## 2016-09-06 NOTE — Telephone Encounter (Signed)
D;c pristiq Start viibryd starter pack sent to pharmacy

## 2016-09-06 NOTE — Progress Notes (Signed)
NEUROLOGY FOLLOW UP OFFICE NOTE  Allison Bass [id number]  HISTORY OF PRESENT ILLNESS: I had the pleasure of seeing Allison Bass.  The patient was last seen 4 months ago. She initially presented with 2 episodes of transient memory difficulties and a fainting spell in March 2017. 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  ASA was initially not started due to evidence of cerebellar hemorrhage. Stroke workup showed unremarkable carotid doppler, echo normal with EF 65-70%. She had an MRA head and neck which did not show significant stenosis, there was decreased signal in the genu of the ICA bilaterally which may be due to atherosclerotic disease or artifact. 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 Bass head CT was normal, she is now on a daily aspirin. She denies any further memory changes, but has noticed blurred vision, mostly in her right eye. She has noticed that it seems better when she cleans her eye glasses. She has not seen her eye doctor yet. She denies any migraines. She became tearful in the office today, and expressed feeling a lot of depression. She wakes up crying and "feels hollow." She is under a lot of stress, feeling pulled by both her children. She has seen her counselor and went into a full blown panic attack. She infrequently takes Xanax. She has not had any seizures since 1995 and is concerned that changing her antidepressant may provoke a seizure. She is interested in seeing a nutritionist for her cholesterol control, but wants to wait until January.  HPI: This is a pleasant 75 yo RH woman with a history of hypertension, depression, anxiety, and seizures since her late 92s. The first seizure occurred in the  early 1990s, she recalls feeling funny, "almost like I was going under surgery," then waking up in the hospital. Her husband had witnessed a generalized convulsion. She was started on Dilantin. She had been seizure-free for a while and a trial of Dilantin taper was done perhaps in 1995, during which she had a couple of breakthrough seizures within a week of stopping medication. She denies any further seizures since restarting the Dilantin in 1995. She denies any further episodes of the funny feeling, no  olfactory/gustatory hallucinations, deja vu,focal numbness/tingling/weakness, myoclonic jerks.  Her mother had seizures. Otherwise she had a normal birth and early development.  There is no history of febrile convulsions, CNS infections such as meningitis/encephalitis, significant traumatic brain injury, neurosurgical procedures, or family history of seizures.  She had been doing well until 01/12/16 she woke up and knew there was something she was supposed to do but could not recall it. She called her friend she needed to pick up food for Meals on Wheels, and after the reminder, she did not have any other issues that day. The next morning she got up and went to pick up food, then she could not think of where her next destination was. She had to call someone to remind her where to go, then she was fine. There is note that the day prior to the first episode she was upset because her cat ran away. She took her normal TID Xanax and then took 2 more prior to bed to help with sleep.  On 01/18/16, she was at church then had an  intense feeling with nausea. The music was coming off the page, she got extremely hot and could hear things from a distance. She started to sit down and recalls her friend saying she could not lift her. She apparently fainted and woke up spread out on several chairs. She did not have the same "funny feeling" she had in the 1990s. No convulsive activity reported. She denied any sleep deprivation or  alcohol the night prior.   Laboratory Data: Lab Results  Component Value Date   HGBA1C 5.6 03/05/2016   Lab Results  Component Value Date   CHOL 216 (H) 03/05/2016   HDL 85.40 03/05/2016   LDLCALC 112 (H) 03/05/2016   TRIG 93.0 03/05/2016   CHOLHDL 3 03/05/2016     PAST MEDICAL HISTORY: Past Medical History:  Diagnosis Date  . Anxiety   . Chicken pox   . Depression   . 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 GERD (gastroesophageal reflux disease)   . Heart murmur   . HTN (hypertension)   . Migraine   . Seasonal allergies     MEDICATIONS: Current Outpatient Prescriptions on File Prior to Visit  Medication Sig Dispense Refill  . ALPRAZolam (XANAX) 0.5 MG tablet Take 1 tablet (0.5 mg total) by mouth 3 (three) times daily. 90 tablet 0  . atorvastatin (LIPITOR) 40 MG tablet TAKE ONE TABLET BY MOUTH ONCE DAILY 30 tablet 3  . cyclobenzaprine (FLEXERIL) 10 MG tablet Take 1 tablet (10 mg total) by mouth 3 (three) times daily as needed for muscle spasms. 30 tablet 0  . desvenlafaxine (PRISTIQ) 100 MG 24 hr tablet Take 1 tablet (100 mg total) by mouth daily. 90 tablet 3  . DILANTIN 100 MG ER capsule Take 3 capsules (300 mg total) by mouth daily. 270 capsule 1  . hydrochlorothiazide (HYDRODIURIL) 50 MG tablet TAKE 1 TABLET(50 MG) BY MOUTH DAILY 90 tablet 1  . KLOR-CON M10 10 MEQ tablet TAKE TWO TABLETS BY MOUTH IN THE MORNING AND TWO IN THE EVENING 360 tablet 1  . lisinopril (PRINIVIL,ZESTRIL) 10 MG tablet Take 1 tablet (10 mg total) by mouth daily. 90 tablet 3  . PREMARIN 1.25 MG tablet Take 1 tablet (1.25 mg total) by mouth daily. 90 tablet 3   No current facility-administered medications on file prior to visit.     ALLERGIES: Allergies  Allergen Reactions  . Erythromycin   . Codeine Rash    Makes the patient walk into walls    FAMILY HISTORY: Family History  Problem Relation Age of Onset  . Alcoholism Father   . Lung cancer Father     Smoker    . Stroke Father   . High blood pressure Father   . Breast cancer Maternal Grandmother   . Heart disease Mother   . High blood pressure Mother   . Depression Mother   . Seizures Mother   . Colon cancer Neg Hx   . Esophageal cancer Neg Hx   . Stomach cancer Neg Hx   . Rectal cancer Neg Hx     SOCIAL HISTORY: Social History   Social History  . Marital status: Widowed    Spouse name: N/A  . Number of children: 3  . Years of education: N/A   Occupational History  . Retired     retired   Social History Main Topics  . Smoking status: Never Smoker  . Smokeless tobacco: Never Used  . Alcohol use 0.0 oz/week  Comment: Occ  . Drug use: No  . Sexual activity: Not on file   Other Topics Concern  . Not on file   Social History Narrative  . No narrative on file    REVIEW OF SYSTEMS: Constitutional: No fevers, chills, or sweats, no generalized fatigue, change in appetite Eyes: No visual changes, double vision, eye pain Ear, nose and throat: No hearing loss, ear pain, nasal congestion, sore throat Cardiovascular: No chest pain, palpitations Respiratory:  No shortness of breath at rest or with exertion, wheezes GastrointestinaI: No nausea, vomiting, diarrhea, abdominal pain, fecal incontinence Genitourinary:  No dysuria, urinary retention or frequency Musculoskeletal:  + neck pain, back pain Integumentary: No rash, pruritus, skin lesions Neurological: as above Psychiatric: No depression, insomnia, anxiety Endocrine: No palpitations, fatigue, diaphoresis, mood swings, change in appetite, change in weight, increased thirst Hematologic/Lymphatic:  No anemia, purpura, petechiae. Allergic/Immunologic: no itchy/runny eyes, nasal congestion, recent allergic reactions, rashes  PHYSICAL EXAM: Vitals:   09/06/16 1412  BP: 124/72  Pulse: 87   General: No acute distress, became tearful in the office Head:  Normocephalic/atraumatic Neck: supple, no paraspinal tenderness, full  range of motion Heart:  Regular rate and rhythm Lungs:  Clear to auscultation bilaterally Back: No paraspinal tenderness Skin/Extremities: No rash, no edema Neurological Exam: alert and oriented to person, place, and time. No aphasia or dysarthria. Fund of knowledge is appropriate.  Recent and remote memory are intact.  Attention and concentration are normal.    Able to name objects and repeat phrases. Cranial nerves: Pupils equal, round, reactive to light. Extraocular movements intact with no nystagmus. Visual fields full. Facial sensation intact. No facial asymmetry. Tongue, uvula, palate midline.  Motor: Bulk and tone normal, muscle strength 5/5 throughout with no pronator drift.  Sensation to light touch intact.  No extinction to double simultaneous stimulation.  Deep tendon reflexes 2+ throughout, toes downgoing.  Finger to nose testing intact.  Gait narrow-based and steady, able to tandem walk adequately.  Romberg negative.  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 She has had breakthrough convulsions with attempts at Dilantin taper in the past. Last GTC was in 1995. She presented for 2 episodes of memory loss and an episode of loss of consciousness last 01/18/16. Her neurological exam is non-focal. 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 Repeat head CT no acute hemorrhage, she is now on aspirin 81mg  daily for secondary stroke prevention. We again discussed control of vascular risk factors, BP today 124/72. She is interested in seeing a nutritionist to help with her diet. There is an incidental finding of small aneurysm on MRA, repeat imaging will be done in May 2018. Her main concern today is the depression, she denies any suicidal ideation. She is asking for direction on medication, switching to Viibryd versus adding on  Abilify. She is concerned about seizures with Abilify. We discussed risks and benefits of each, since she is very depressed at this time, would opt for adding on Abilify. She has not had any seizures since 1995, the risk would be low. She is aware of  driving laws to stop driving after an episode of loss of consciousness until 6 months event-free. She will Bass in 6 months and knows to call for any changes.   Thank you for allowing me to participate in her care.  Please do not hesitate  to call for any questions or concerns.  The duration of this appointment visit was 25 minutes of face-to-face time with the patient.  Greater than 50% of this time was spent in counseling, explanation of diagnosis, planning of further management, and coordination of care.   Ellouise Newer, M.D.   CC: Dr. Cheri Rous

## 2016-09-07 ENCOUNTER — Other Ambulatory Visit: Payer: Self-pay

## 2016-09-07 ENCOUNTER — Telehealth: Payer: Self-pay

## 2016-09-07 ENCOUNTER — Telehealth: Payer: Self-pay | Admitting: Family Medicine

## 2016-09-07 MED ORDER — ALPRAZOLAM 0.5 MG PO TABS: 0.5000 mg | ORAL_TABLET | Freq: Three times a day (TID) | ORAL | 0 refills | Status: DC

## 2016-09-07 MED ORDER — ATORVASTATIN CALCIUM 40 MG PO TABS: 40.0000 mg | ORAL_TABLET | Freq: Every day | ORAL | 1 refills | Status: DC

## 2016-09-07 NOTE — Telephone Encounter (Signed)
Jimmie Molly - [facility] [address] - [place], [place] - [address]   Phone: [phone]    Called in because she says that they no longer carry VIIBRYD STARTER PACK. She says that she can give the regular Rx but would like to know if she should give 20 MG or 40 MG ?    Please advise.    Thanks.

## 2016-09-07 NOTE — Telephone Encounter (Signed)
I have faxed Rx for Xanax to [facility] for pick up. TL/CMA

## 2016-09-07 NOTE — Telephone Encounter (Signed)
See phone note (09/07/16).

## 2016-09-07 NOTE — Telephone Encounter (Signed)
Called and left a message for call back  

## 2016-09-07 NOTE — Addendum Note (Signed)
Addended by: Magdalene Molly A on: 09/07/2016 09:43 AM   Modules accepted: Orders

## 2016-09-07 NOTE — Telephone Encounter (Signed)
Last seen 04/20/16 Last filled 08/04/16  #90-0 rf Sig: tx 1 tab po tid Please advise PC

## 2016-09-07 NOTE — Telephone Encounter (Signed)
Please advise 

## 2016-09-07 NOTE — Telephone Encounter (Addendum)
Please call pt---- I had already called viibryd in --- please cancel I have sent a message to Dr Delice Lesch to discuss meds --- with her hx of seizures I want to make sure she is ok with whatever we choose

## 2016-09-08 MED ORDER — ARIPIPRAZOLE 2 MG PO TABS: 2.0000 mg | ORAL_TABLET | Freq: Every day | ORAL | 1 refills | Status: DC

## 2016-09-08 NOTE — Telephone Encounter (Signed)
I answered this several times already--- not sure why response is not her Cancel viibryd Per neuo and counselor we will add abilify 2 mg---- think I already sent it

## 2016-09-08 NOTE — Telephone Encounter (Addendum)
Rx for Abilify sent in to pharmacy. Called patient to make her aware.  Left a message for call back.  Called [facility] pharmacy to make them aware of the changes and noted in provider's note.  Pharmacy made aware and verified that Rx for Abilify had been received.  They also asked about xanax prescription.  Prescription phoned in.  Called [facility] and cancelled Rx sent there.

## 2016-09-08 NOTE — Telephone Encounter (Signed)
Jimmie Molly - [facility] [address] - [place], [place] - [address] [place] in to follow up on refill request. Advised that Rx went to [facility] instead. She says that Rx should have been sent to them .   Please assist further.

## 2016-09-08 NOTE — Telephone Encounter (Signed)
Allison Bass from [facility] pharmacy called in to also follow up on message. I advised her of providers response. She says that she will cancel out Rx.

## 2016-09-08 NOTE — Telephone Encounter (Signed)
Rx for Abilify sent in to pharmacy. Called patient to make her aware.  Left a message for call back.  Called [facility] pharmacy to make them aware of the changes and noted in provider's note.  Pharmacy made aware and verified that Rx for Abilify had been received.  They also asked about xanax prescription.  Prescription phoned in.  Pharmacist states they will call to cancel Rx sent to [facility].

## 2016-09-09 ENCOUNTER — Ambulatory Visit (INDEPENDENT_AMBULATORY_CARE_PROVIDER_SITE_OTHER): Payer: Medicare Other | Admitting: Licensed Clinical Social Worker

## 2016-09-09 DIAGNOSIS — F3341 Major depressive disorder, recurrent, in partial remission: Secondary | ICD-10-CM

## 2016-09-09 DIAGNOSIS — G40009 Localization-related (focal) (partial) idiopathic epilepsy and epileptic syndromes with seizures of localized onset, not intractable, without status epilepticus: Secondary | ICD-10-CM | POA: Insufficient documentation

## 2016-09-09 NOTE — Telephone Encounter (Signed)
Pt returned call.  Message regarding medications were discussed with her by Claudia Desanctis, scheduler. Pt stated understanding. No questions or concerns voiced.

## 2016-09-13 ENCOUNTER — Telehealth: Payer: Self-pay | Admitting: Family Medicine

## 2016-09-13 NOTE — Telephone Encounter (Signed)
cipro 250 mg bid x 3 days 

## 2016-09-13 NOTE — Telephone Encounter (Signed)
Pt states she would come in, but she does not have a car.  She says if Dr. Etter Sjogren can't prescribe her anything, she's go to an urgent care or something once she's goes out of town to see her grandchildren.

## 2016-09-13 NOTE — Telephone Encounter (Signed)
Can she at least come by and leave urine sample

## 2016-09-13 NOTE — Telephone Encounter (Signed)
Relation to [id number] Call back number:[phone] Pharmacy: [facility] [place], [place] - [id number]  Reason for call:  Patient is going out of town and requesting a Rx for UTI,patient declined appt due to traveling. Patient experiencing frequent urination, lower abdominal pain. Please advise

## 2016-09-14 MED ORDER — CIPROFLOXACIN HCL 250 MG PO TABS: 250.0000 mg | ORAL_TABLET | Freq: Two times a day (BID) | ORAL | 0 refills | Status: DC

## 2016-09-14 NOTE — Telephone Encounter (Signed)
Medication filled to pharmacy as requested. Called patient and left detailed message (per DPR) with instructions below.

## 2016-09-30 ENCOUNTER — Ambulatory Visit (INDEPENDENT_AMBULATORY_CARE_PROVIDER_SITE_OTHER): Payer: Medicare Other | Admitting: Licensed Clinical Social Worker

## 2016-09-30 DIAGNOSIS — F3341 Major depressive disorder, recurrent, in partial remission: Secondary | ICD-10-CM

## 2016-10-04 ENCOUNTER — Telehealth: Payer: Self-pay | Admitting: Family Medicine

## 2016-10-04 NOTE — Telephone Encounter (Signed)
Last filled: 09/07/16 Amt: 90,0 Last OV: 04/20/16 No UDS or CSC on file.  Please advise.

## 2016-10-04 NOTE — Telephone Encounter (Signed)
Relation to [id number] Call back number:[phone] Pharmacy: [facility] [address] - [place], [place] - [address] [phone]   Reason for call:  Patient requesting a refill ALPRAZolam (XANAX) 0.5 MG tablet

## 2016-10-04 NOTE — Telephone Encounter (Signed)
Refill x1    1 refill  Need contract and UDS

## 2016-10-05 MED ORDER — ALPRAZOLAM 0.5 MG PO TABS: 0.5000 mg | ORAL_TABLET | Freq: Three times a day (TID) | ORAL | 1 refills | Status: DC

## 2016-10-05 NOTE — Telephone Encounter (Signed)
Rx printed and forwarded to PCP for review and signature.  Amelia printed and attached.

## 2016-10-06 NOTE — Telephone Encounter (Signed)
Left message on pt's vm to inform her Rx is ready for pick up along with contract to sign. Placed in folder in front. LB

## 2016-10-08 ENCOUNTER — Telehealth: Payer: Self-pay | Admitting: *Deleted

## 2016-10-08 NOTE — Telephone Encounter (Signed)
We received another fax from pharmacy about alprazolam.  Sent request back because patient needs to pickup. Patient was informed that she needs to pickup.

## 2016-10-14 DIAGNOSIS — Z79899 Other long term (current) drug therapy: Secondary | ICD-10-CM | POA: Diagnosis not present

## 2016-10-25 ENCOUNTER — Encounter: Payer: Self-pay | Admitting: Family Medicine

## 2016-10-25 DIAGNOSIS — J0100 Acute maxillary sinusitis, unspecified: Secondary | ICD-10-CM | POA: Diagnosis not present

## 2016-10-25 DIAGNOSIS — J069 Acute upper respiratory infection, unspecified: Secondary | ICD-10-CM | POA: Diagnosis not present

## 2016-10-26 ENCOUNTER — Other Ambulatory Visit: Payer: Self-pay | Admitting: *Deleted

## 2016-10-26 MED ORDER — DESVENLAFAXINE SUCCINATE ER 100 MG PO TB24: 100.0000 mg | ORAL_TABLET | Freq: Every day | ORAL | 0 refills | Status: DC

## 2016-10-26 NOTE — Telephone Encounter (Signed)
Patient needs office visit before any more refills.

## 2016-11-04 ENCOUNTER — Ambulatory Visit: Payer: Medicare Other | Admitting: Licensed Clinical Social Worker

## 2016-11-22 ENCOUNTER — Encounter: Payer: Self-pay | Admitting: Family Medicine

## 2016-11-25 ENCOUNTER — Ambulatory Visit (INDEPENDENT_AMBULATORY_CARE_PROVIDER_SITE_OTHER): Payer: Medicare Other | Admitting: Licensed Clinical Social Worker

## 2016-11-25 DIAGNOSIS — F3341 Major depressive disorder, recurrent, in partial remission: Secondary | ICD-10-CM | POA: Diagnosis not present

## 2016-12-01 ENCOUNTER — Encounter: Payer: Self-pay | Admitting: Family Medicine

## 2016-12-01 ENCOUNTER — Ambulatory Visit (INDEPENDENT_AMBULATORY_CARE_PROVIDER_SITE_OTHER): Payer: Medicare Other | Admitting: Family Medicine

## 2016-12-01 DIAGNOSIS — M25561 Pain in right knee: Secondary | ICD-10-CM | POA: Diagnosis not present

## 2016-12-01 NOTE — Patient Instructions (Signed)
Start physical therapy for lateral meniscus tear. Wear knee brace when up and walking around for support. Let me know how you're doing after a couple weeks. Follow up with me in 4 weeks at the latest.

## 2016-12-04 NOTE — Assessment & Plan Note (Signed)
known ACL tear.  Current problem more consistent with a flap lateral meniscus tear.  She is going on a trip in 3 months and will be doing a lot of walking.  We discussed conservative treatment vs imaging and arthroscopy.  She will go ahead with 4 weeks of physical therapy.  Knee brace, icing.  If not improving will go ahead with MRI

## 2016-12-04 NOTE — Progress Notes (Addendum)
PCP: Ann Held, DO  Subjective:   HPI: Patient is a 76 y.o. female here for right knee pain.  6/30: Patient reports on 6/2 she was at a wedding coming down some steps when her right knee buckled and she accidentally inverted her left ankle. Immediate pain, inability to bear weight. Had a lot of swelling. Went to ED same day - radiographs negative for fracture of ankle.  Radiographs of right knee with tiny loose body likely from arthropathy but no acute fracture - I am unable to pull up the images. 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 Written for physical therapy but hasn't started this and would like to go downstairs as more convenient for her. She tore right ACL in 1999 but did not have any surgical intervention for this. Occasionally gives way on her. No catching, locking. Using crutches because of ankle. Still swelling but has improved. Pain is 2/10 at rest, up to 7/10 with ambulation, sharp. No skin changes, numbness.  7/14: Patient reports she feels completely better. Did a week of physical therapy. Wearing ASO. Doing home exercises. Right knee still feels a little weak. Pain level 0/10. No skin changes, numbness.  12/03/16: Patient returns with right knee issues. She had been overall doing well. But states over past few weeks she's had worse problems going from sitting to standing. Pain is 0/10 currently, can be sharp laterally. Concerned because she is going on a trip to [place] in May. Feels like knee gives out on her at times. No locking or catching. No skin changes, numbness.  Past Medical History:  Diagnosis Date  . Anxiety   . Chicken pox   . Depression   . 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 GERD (gastroesophageal reflux disease)   . Heart murmur   . HTN (hypertension)   . Migraine   . Seasonal allergies     Current Outpatient Prescriptions on File Prior to Visit   Medication Sig Dispense Refill  . ALPRAZolam (XANAX) 0.5 MG tablet Take 1 tablet (0.5 mg total) by mouth 3 (three) times daily. 90 tablet 1  . ARIPiprazole (ABILIFY) 2 MG tablet Take 1 tablet (2 mg total) by mouth daily. 90 tablet 1  . atorvastatin (LIPITOR) 40 MG tablet Take 1 tablet (40 mg total) by mouth daily. 90 tablet 1  . ciprofloxacin (CIPRO) 250 MG tablet Take 1 tablet (250 mg total) by mouth 2 (two) times daily. 6 tablet 0  . cyclobenzaprine (FLEXERIL) 10 MG tablet Take 1 tablet (10 mg total) by mouth 3 (three) times daily as needed for muscle spasms. 30 tablet 0  . desvenlafaxine (PRISTIQ) 100 MG 24 hr tablet Take 1 tablet (100 mg total) by mouth daily. 90 tablet 0  . DILANTIN 100 MG ER capsule Take 3 capsules (300 mg total) by mouth daily. 270 capsule 1  . hydrochlorothiazide (HYDRODIURIL) 50 MG tablet TAKE 1 TABLET(50 MG) BY MOUTH DAILY 90 tablet 1  . KLOR-CON M10 10 MEQ tablet TAKE TWO TABLETS BY MOUTH IN THE MORNING AND TWO IN THE EVENING 360 tablet 1  . lisinopril (PRINIVIL,ZESTRIL) 10 MG tablet Take 1 tablet (10 mg total) by mouth daily. 90 tablet 3  . PREMARIN 1.25 MG tablet Take 1 tablet (1.25 mg total) by mouth daily. 90 tablet 3  . Vilazodone HCl (VIIBRYD STARTER PACK) 10 & 20 MG KIT Take 10 mg by mouth daily. 1 kit  0   No current facility-administered medications on file prior to visit.     Past Surgical History:  Procedure Laterality Date  . ABDOMINAL HYSTERECTOMY    . APPENDECTOMY     with hysterectomy  . CATARACT EXTRACTION Bilateral   . LYMPHADENECTOMY     Benign, age 28  . TONSILLECTOMY AND ADENOIDECTOMY      Allergies  Allergen Reactions  . Erythromycin   . Codeine Rash    Makes the patient walk into walls    Social History   Social History  . Marital status: Widowed    Spouse name: N/A  . Number of children: 3  . Years of education: N/A   Occupational History  . Retired     retired   Social History Main Topics  . Smoking status: Never  Smoker  . Smokeless tobacco: Never Used  . Alcohol use 0.0 oz/week     Comment: Occ  . Drug use: No  . Sexual activity: Not on file   Other Topics Concern  . Not on file   Social History Narrative  . No narrative on file    Family History  Problem Relation Age of Onset  . Alcoholism Father   . Lung cancer Father     Smoker  . Stroke Father   . High blood pressure Father   . Breast cancer Maternal Grandmother   . Heart disease Mother   . High blood pressure Mother   . Depression Mother   . Seizures Mother   . Colon cancer Neg Hx   . Esophageal cancer Neg Hx   . Stomach cancer Neg Hx   . Rectal cancer Neg Hx     BP 123/73   Pulse 84   Ht 5' 6"  (1.676 m)   Wt 150 lb (68 kg)   BMI 24.21 kg/m   Review of Systems: See HPI above.    Objective:  Physical Exam:  Gen: NAD, comfortable in exam room  Right knee: No gross deformity, ecchymoses, effusion. TTP lateral joint line.  No other tenderness. FROM. 1+ ant drawer, lachmanns.  Negative post drawer.  Negative valgus/varus testing. Positive mcmurrays, apleys, sit home, thessalys.  Negative patellar apprehension. NV intact distally.    Assessment & Plan:  1. Right knee pain, instability - known ACL tear.  Current problem more consistent with a flap lateral meniscus tear.  She is going on a trip in 3 months and will be doing a lot of walking.  We discussed conservative treatment vs imaging and arthroscopy.  She will go ahead with 4 weeks of physical therapy.  Knee brace, icing.  If not improving will go ahead with MRI  Addendum:  MRI reviewed and discussed with patient.  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  I encouraged her to try injection and continued PT, see how she does over a couple weeks.  If improving would continue with conservative measures - if not, would refer to ortho to consider arthroscopic debridement of tear.

## 2016-12-13 ENCOUNTER — Ambulatory Visit: Payer: Medicare Other | Attending: Family Medicine | Admitting: Physical Therapy

## 2016-12-13 ENCOUNTER — Encounter: Payer: Self-pay | Admitting: Family Medicine

## 2016-12-13 DIAGNOSIS — R262 Difficulty in walking, not elsewhere classified: Secondary | ICD-10-CM | POA: Diagnosis not present

## 2016-12-13 DIAGNOSIS — R2689 Other abnormalities of gait and mobility: Secondary | ICD-10-CM | POA: Insufficient documentation

## 2016-12-13 DIAGNOSIS — M25561 Pain in right knee: Secondary | ICD-10-CM | POA: Diagnosis not present

## 2016-12-13 NOTE — Patient Instructions (Signed)
Quad Set    With other leg bent, foot flat, slowly tighten muscles on thigh of straight leg while counting out loud to _5___. Repeat with other leg. Repeat _15___ times. Do _2-3___ sessions per day.    Straight Leg Raise    Tighten stomach and slowly raise locked right leg __6__ inches from floor. Repeat _15___ times per set. Do _2-3___ sessions per day.    Outer Hip Stretch: Reclined IT Band Stretch (Strap)    Strap around opposite foot, pull across only as far as possible with shoulders on mat. Hold for ___30_ breaths. Repeat __3__ times each leg.

## 2016-12-13 NOTE — Addendum Note (Signed)
Addended by: Sherrie George F on: 12/13/2016 04:20 PM   Modules accepted: Orders

## 2016-12-14 NOTE — Therapy (Signed)
[facility] [address]  [place], [place], [address] Phone: [phone]   Fax:  [phone]  Physical Therapy Evaluation  Patient Details  Name: Allison Bass MRN: [id number] Date of Birth: 1941/05/05 Referring Provider: Dr. Barbaraann Barthel  Encounter Date: 12/13/2016      PT End of Session - 12/14/16 1200    Visit Number 1   Number of Visits 12   Date for PT Re-Evaluation 01/25/17   PT Start Time [id number]   PT Stop Time 1615   PT Time Calculation (min) 42 min   Activity Tolerance Patient tolerated treatment well   Behavior During Therapy [facility] for tasks assessed/performed      Past Medical History:  Diagnosis Date  . Anxiety   . Chicken pox   . Depression   . 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 GERD (gastroesophageal reflux disease)   . Bass murmur   . HTN (hypertension)   . Migraine   . Seasonal allergies     Past Surgical History:  Procedure Laterality Date  . ABDOMINAL HYSTERECTOMY    . APPENDECTOMY     with hysterectomy  . CATARACT EXTRACTION Bilateral   . LYMPHADENECTOMY     Benign, age 70  . TONSILLECTOMY AND ADENOIDECTOMY      There were no vitals filed for this visit.       Subjective Assessment - 12/13/16 1536    Subjective Patient reporting R knee pain - issue started after a fall in the summer. Reports her knee giving way many times going down stairs and walking on level ground. Pain is localized to R lateral knee area. Patient reporting catching and buckling. Stiffness and pain after prolonged sitting. Denies numbness and tingling. Most difficulty with prolonged walking, stairs, squatting down, household chores. No pain currently - maximal pain with catching sensation.    Limitations Walking   Diagnostic tests Xray - no diagnostic findings   Patient Stated Goals reduce pain, regain normal function   Currently in Pain? No/denies   Pain Score 0-No pain   Pain Location Knee   Pain Orientation Right   Pain Type Chronic pain   Pain Onset More than a month ago   Pain Frequency Intermittent   Aggravating Factors  prolonged walking   Pain Relieving Factors heat, resting            [facility] PT Assessment - 12/13/16 1545      Assessment   Medical Diagnosis R knee pain   Referring Provider Dr. Barbaraann Barthel   Next MD Visit 2-3 weeks   Prior Therapy yes     Precautions   Precautions None     Restrictions   Weight Bearing Restrictions No     Balance Screen   Has the patient fallen in the past 6 months No  knee buckles - has not "hit ground"   Has the patient had a decrease in activity level because of a fear of falling?  Yes   Is the patient reluctant to leave their home because of a fear of falling?  No     Home Environment   Living Environment Private residence   Living Arrangements --   Type of [place] to enter   Entrance Stairs-Number of Steps 2   Entrance Stairs-Rails Can reach both   [place] One level   [place] - single point  Prior Function   Level of Independence Independent   Vocation Retired;Volunteer work   Leisure travelling     Charity fundraiser Status Within Functional Limits for tasks assessed     Observation/Other Assessments   Focus on Therapeutic Outcomes (FOTO)  Knee 44 (56% limited, predicted 43% limited)     Coordination   Gross Motor Movements are Fluid and Coordinated Yes     ROM / Strength   AROM / PROM / Strength Strength     Strength   Strength Assessment Site Hip;Knee   Right/Left Hip Right;Left   Right Hip Flexion 4-/5  pain   Right Hip Extension 3/5   Right Hip ABduction 4-/5   Left Hip Flexion 4/5   Left Hip Extension 4-/5   Left Hip ABduction 4/5   Right/Left Knee Right;Left   Right Knee Flexion 3+/5   Right Knee Extension 3+/5   Left Knee Flexion 4+/5   Left Knee Extension 4+/5     Palpation   Palpation comment tenderness to  palpation along lateral joint line of R knee     Special Tests    Special Tests Knee Special Tests;Meniscus Tests;Laxity/Instability Tests   Laxity/Instability  Anterior drawer test;Posterior drawer test;other   Knee Special tests  Patellofemoral Grind Test (Clarke's Sign)   Meniscus Tests McMurray Test     Anterior drawer test   Findings Positive   Side Right   Comment known deficiency     Posterior drawer test   Findings Negative   Side  Right     Other   side Right   comment MCL - no pain; LCL - painful at 0 and 30 degrees     Patellofemoral Grind test (Clark's Sign)   Findings Negative   Side  Right     McMurray Test   Side Right   Comments not tru positive - slight discomfort                   [facility] Adult PT Treatment/Exercise - 12/14/16 0001      Exercises   Exercises Knee/Hip     Knee/Hip Exercises: Stretches   Other Knee/Hip Stretches IT band stretch - supine with strap 3 x 30 sec     Knee/Hip Exercises: Supine   Quad Sets Right;15 reps   Quad Sets Limitations 5 second hold   Straight Leg Raises 10 reps   Straight Leg Raises Limitations VC for form                PT Education - 12/14/16 1200    Education provided Yes   Education Details exam findings, POC, HEP   Person(s) Educated Patient   Methods Explanation;Demonstration;Handout   Comprehension Verbalized understanding;Returned demonstration;Need further instruction             PT Long Term Goals - 12/14/16 1240      PT LONG TERM GOAL #1   Title Patient to be independent with HEP (01/24/17)   Status New     PT LONG TERM GOAL #2   Title Patient to demonstrate or report ability to ambulate for >30 minutes with no increase in pain (01/24/17)   Status New     PT LONG TERM GOAL #3   Title Patient to demonstrate ability to ascend/descend 1 flight of stairs with good quad control and no increase in pain (01/24/17)   Status New     PT LONG TERM GOAL #4   Title Patient to  improve  R LE strength to >/= 4+/5 with no increase in pain (01/24/17)   Status New     PT LONG TERM GOAL #5   Title Patient to ambulate with appropriate gait mechanics with good heel toe gait pattern (01/24/17)   Status New               Plan - 12/14/16 1203    Clinical Impression Statement Patient is a 76 y/o female presenting to [facility] today for low complexity evaluation regarding R knee pain, initially exacerbated by fall in June of 2017. Patient with subjective reports of increase knee pain recently with "catching" as well as buckling, and feelings of knee "giving way." Patient today with pain during full knee extension and flexion, joint line tenderness along R lateral posterior knee joint, reduced gait mechanics with reduced weight shift onto R LE. Patient reporting MD feels as this may be a possible meniscal tear. Patient to benefit from PT to address the above listed deficits to maximize function and mobility.    Rehab Potential Good   PT Frequency 2x / week   PT Duration 6 weeks   PT Treatment/Interventions ADLs/Self Care Home Management;Cryotherapy;Electrical Stimulation;Moist Heat;Ultrasound;Iontophoresis 4mg /ml Dexamethasone;Neuromuscular re-education;Balance training;Therapeutic exercise;Therapeutic activities;Functional mobility training;Stair training;Gait training;Patient/family education;Manual techniques;Passive range of motion;Vasopneumatic Device;Taping;Dry needling   Consulted and Agree with Plan of Care Patient      Patient will benefit from skilled therapeutic intervention in order to improve the following deficits and impairments:  Abnormal gait, Decreased activity tolerance, Decreased balance, Decreased mobility, Decreased strength, Difficulty walking, Pain  Visit Diagnosis: Right knee pain, unspecified chronicity - Plan: PT plan of care cert/re-cert  Difficulty in walking, not elsewhere classified - Plan: PT plan of care cert/re-cert  Other abnormalities of gait  and mobility - Plan: PT plan of care cert/re-cert      G-Codes - [id number]    Functional Assessment Tool Used FOTO: Knee 44 (56% limited)   Functional Limitation Mobility: Walking and moving around   Mobility: Walking and Moving Around Current Status [id number]) At least 40 percent but less than 60 percent impaired, limited or restricted   Mobility: Walking and Moving Around Goal Status [phone]) At least 40 percent but less than 60 percent impaired, limited or restricted       Problem List Patient Active Problem List   Diagnosis Date Noted  . Localization-related idiopathic epilepsy and epileptic syndromes with seizures of localized onset, not intractable, without status epilepticus ([place]) 09/09/2016  . Injury of left ankle and foot 07/13/2016  . 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 Essential hypertension 05/17/2016  . Right knee pain 04/23/2016  . Ankle sprain 04/01/2016  . Low back pain 01/27/2016  . Faintness 01/27/2016  . Transient alteration of awareness 01/27/2016  . Poison ivy dermatitis 03/11/2015  . 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 Epilepsy ([place]) 02/24/2015     Lanney Gins, PT, DPT 12/14/16 12:52 PM   [facility] [address]  [place], [place], [address] Phone: [phone]   Fax:  [phone]  Name: Allison Bass MRN: [id number] Date of Birth: 1941/06/28

## 2016-12-15 ENCOUNTER — Ambulatory Visit: Payer: Medicare Other | Admitting: Physical Therapy

## 2016-12-15 ENCOUNTER — Telehealth: Payer: Self-pay | Admitting: Family Medicine

## 2016-12-15 DIAGNOSIS — R262 Difficulty in walking, not elsewhere classified: Secondary | ICD-10-CM

## 2016-12-15 DIAGNOSIS — R2689 Other abnormalities of gait and mobility: Secondary | ICD-10-CM | POA: Diagnosis not present

## 2016-12-15 DIAGNOSIS — M25561 Pain in right knee: Secondary | ICD-10-CM

## 2016-12-15 MED ORDER — HYDROCHLOROTHIAZIDE 50 MG PO TABS: ORAL_TABLET | ORAL | 0 refills | Status: DC

## 2016-12-15 NOTE — Telephone Encounter (Signed)
rx for hctz sent in needs to follow up  Alprazolam Last filled 10/05/16 #90 1RF Last ov 04/20/16 Contract signed 10/13/16 UDS due NOW--last screening no alprazolam detected

## 2016-12-15 NOTE — Telephone Encounter (Signed)
Refill x1--- need uds 

## 2016-12-15 NOTE — Therapy (Signed)
[facility] [address]  [place], [place], [address] Phone: [phone]   Fax:  [phone]  Physical Therapy Treatment  Patient Details  Name: Allison Bass MRN: [id number] Date of Birth: [birth year] Referring Provider: Dr. Barbaraann Barthel  Encounter Date: 12/15/2016      PT End of Session - 12/15/16 1715    Visit Number 2   Number of Visits 12   Date for PT Re-Evaluation 01/25/17   PT Start Time [id number]   PT Stop Time 1611   PT Time Calculation (min) 40 min   Activity Tolerance Patient tolerated treatment well   Behavior During Therapy [facility] for tasks assessed/performed      Past Medical History:  Diagnosis Date  . Anxiety   . Chicken pox   . Depression   . Epilepsy ([place])    controlled by Dilantin- last seizure 1995  . GERD (gastroesophageal reflux disease)   . Heart murmur   . HTN (hypertension)   . Migraine   . Seasonal allergies     Past Surgical History:  Procedure Laterality Date  . ABDOMINAL HYSTERECTOMY    . APPENDECTOMY     with hysterectomy  . CATARACT EXTRACTION Bilateral   . LYMPHADENECTOMY     Benign, age 39  . TONSILLECTOMY AND ADENOIDECTOMY      There were no vitals filed for this visit.      Subjective Assessment - 12/15/16 1533    Subjective was sore/stiff after last session; went to dinner that night, had difficulty standing up.    Diagnostic tests Xray - no diagnostic findings; scheduled for MRI on 12/18/13   Patient Stated Goals reduce pain, regain normal function   Currently in Pain? Yes   Pain Score 2    Pain Location Knee   Pain Orientation Right   Pain Descriptors / Indicators --  "uncomfortable"   Pain Type Chronic pain   Pain Onset More than a month ago   Pain Frequency Intermittent   Aggravating Factors  prolonged walking   Pain Relieving Factors heat, resting                         [facility] Adult PT Treatment/Exercise - 12/15/16 1540      Knee/Hip Exercises: Aerobic   Nustep L3 x 6 minutes     Knee/Hip Exercises: Standing   Heel Raises Both;15 reps     Knee/Hip Exercises: Seated   Long Arc Quad Strengthening;Right;15 reps   Long Arc Quad Limitations with adduction ball squeeze   Other Seated Knee/Hip Exercises fitter - 2 blue bands - R LE x 15 reps   Hamstring Curl Strengthening;Right;15 reps   Hamstring Limitations red tband     Knee/Hip Exercises: Supine   Quad Sets Right;10 reps   Quad Sets Limitations 5 second hold   Bridges Limitations B LE - 15 reps with 5" hold   Straight Leg Raises Right;15 reps     Modalities   Modalities Iontophoresis     Iontophoresis   Type of Iontophoresis Dexamethasone   Location R lateral knee                PT Education - 12/14/16 1200    Education provided Yes   Education Details exam findings, POC, HEP   Person(s) Educated Patient   Methods Explanation;Demonstration;Handout   Comprehension Verbalized understanding;Returned demonstration;Need further instruction  PT Long Term Goals - 12/15/16 1716      PT LONG TERM GOAL #1   Title Patient to be independent with HEP (01/24/17)   Status On-going     PT LONG TERM GOAL #2   Title Patient to demonstrate or report ability to ambulate for >30 minutes with no increase in pain (01/24/17)   Status On-going     PT LONG TERM GOAL #3   Title Patient to demonstrate ability to ascend/descend 1 flight of stairs with good quad control and no increase in pain (01/24/17)   Status On-going     PT LONG TERM GOAL #4   Title Patient to improve R LE strength to >/= 4+/5 with no increase in pain (01/24/17)   Status On-going     PT LONG TERM GOAL #5   Title Patient to ambulate with appropriate gait mechanics with good heel toe gait pattern (01/24/17)   Status On-going               Plan - 12/15/16 1716    Clinical Impression Statement [name] doing well today - some difficulty with transfers and initiating  gait today with pain limiting. Patient doing well with all exercise, however, does require increased time to complete activities. Some catching noted during knee flexion/extension with pain resolving after movement. Ionto initiated today with good verbal understanding by patient on precautions and removal of patch. Patient to have MRI on 2/24. Will continue to benefit from PT.    PT Treatment/Interventions ADLs/Self Care Home Management;Cryotherapy;Electrical Stimulation;Moist Heat;Ultrasound;Iontophoresis 4mg /ml Dexamethasone;Neuromuscular re-education;Balance training;Therapeutic exercise;Therapeutic activities;Functional mobility training;Stair training;Gait training;Patient/family education;Manual techniques;Passive range of motion;Vasopneumatic Device;Taping;Dry needling   PT Next Visit Plan assess repsonse to ionto; gentle strengthening   Consulted and Agree with Plan of Care Patient      Patient will benefit from skilled therapeutic intervention in order to improve the following deficits and impairments:  Abnormal gait, Decreased activity tolerance, Decreased balance, Decreased mobility, Decreased strength, Difficulty walking, Pain  Visit Diagnosis: Right knee pain, unspecified chronicity  Difficulty in walking, not elsewhere classified  Other abnormalities of gait and mobility       G-Codes - 01-13-2017 1250    Functional Assessment Tool Used (Outpatient Only) FOTO: Knee 44 (56% limited)   Functional Limitation Mobility: Walking and moving around   Mobility: Walking and Moving Around Current Status [id number]) At least 40 percent but less than 60 percent impaired, limited or restricted   Mobility: Walking and Moving Around Goal Status [phone]) At least 40 percent but less than 60 percent impaired, limited or restricted      Problem List Patient Active Problem List   Diagnosis Date Noted  . Localization-related idiopathic epilepsy and epileptic syndromes with seizures of localized  onset, not intractable, without status epilepticus ([place]) 09/09/2016  . Injury of left ankle and foot 07/13/2016  . 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 Essential hypertension 05/17/2016  . Right knee pain 04/23/2016  . Ankle sprain 04/01/2016  . Low back pain 01/27/2016  . Faintness 01/27/2016  . Transient alteration of awareness 01/27/2016  . Poison ivy dermatitis 03/11/2015  . 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 Epilepsy ([place]) 02/24/2015     Lanney Gins, PT, DPT 12/15/16 5:28 PM   [facility] [address]  [place], [place], [address] Phone: [phone]   Fax:  [phone]  Name: Allison Bass MRN: [id number] Date of Birth: [birth year]

## 2016-12-15 NOTE — Patient Instructions (Signed)
Bridge    Lie back, legs bent. Inhale, pressing hips up. Keeping ribs in, lengthen lower back. Exhale, rolling down along spine from top. Repeat ___15_ times. Do ___2_ sessions per day.   Long [employer]    Straighten operated leg and try to hold it _3-5___ seconds.  Repeat __15__ times. Do _2___ sessions a day.    IONTOPHORESIS PATIENT PRECAUTIONS & CONTRAINDICATIONS:  . Redness under one or both electrodes can occur.  This characterized by a uniform redness that usually disappears within 12 hours of treatment. . Small pinhead size blisters may result in response to the drug.  Contact your physician if the problem persists more than 24 hours. . On rare occasions, iontophoresis therapy can result in temporary skin reactions such as rash, inflammation, irritation or burns.  The skin reactions may be the result of individual sensitivity to the ionic solution used, the condition of the skin at the start of treatment, reaction to the materials in the electrodes, allergies or sensitivity to dexamethasone, or a poor connection between the patch and your skin.  Discontinue using iontophoresis if you have any of these reactions and report to your therapist. . Remove the Patch or electrodes if you have any undue sensation of pain or burning during the treatment and report discomfort to your therapist. . Tell your Therapist if you have had known adverse reactions to the application of electrical current. . If using the Patch, the LED light will turn off when treatment is complete and the patch can be removed.  Approximate treatment time is 1-3 hours.  Remove the patch when light goes off or after 6 hours. . The Patch can be worn during normal activity, however excessive motion where the electrodes have been placed can cause poor contact between the skin and the electrode or uneven electrical current resulting in greater risk of skin irritation. Marland Kitchen Keep out of the reach of children.   . DO NOT use  if you have a cardiac pacemaker or any other electrically sensitive implanted device. . DO NOT use if you have a known sensitivity to dexamethasone. . DO NOT use during Magnetic Resonance Imaging (MRI). . DO NOT use over broken or compromised skin (e.g. sunburn, cuts, or acne) due to the increased risk of skin reaction. . DO NOT SHAVE over the area to be treated:  To establish good contact between the Patch and the skin, excessive hair may be clipped. . DO NOT place the Patch or electrodes on or over your eyes, directly over your heart, or brain. . DO NOT reuse the Patch or electrodes as this may cause burns to occur.

## 2016-12-15 NOTE — Telephone Encounter (Signed)
[facility] [address] - [place], [place] - [address]  Reason for call:  Pharmacy requesting a refill hydrochlorothiazide (HYDRODIURIL) 50 MG tablet and ALPRAZolam (XANAX) 0.5 MG tablet

## 2016-12-16 ENCOUNTER — Other Ambulatory Visit: Payer: Self-pay | Admitting: Family Medicine

## 2016-12-16 ENCOUNTER — Ambulatory Visit: Payer: Medicare Other | Admitting: Licensed Clinical Social Worker

## 2016-12-16 DIAGNOSIS — I10 Essential (primary) hypertension: Secondary | ICD-10-CM

## 2016-12-16 MED ORDER — LISINOPRIL 10 MG PO TABS: 10.0000 mg | ORAL_TABLET | Freq: Every day | ORAL | 0 refills | Status: DC

## 2016-12-17 ENCOUNTER — Encounter: Payer: Self-pay | Admitting: Family Medicine

## 2016-12-17 DIAGNOSIS — Z79899 Other long term (current) drug therapy: Secondary | ICD-10-CM | POA: Diagnosis not present

## 2016-12-17 MED ORDER — ALPRAZOLAM 0.5 MG PO TABS: 0.5000 mg | ORAL_TABLET | Freq: Three times a day (TID) | ORAL | 0 refills | Status: DC

## 2016-12-17 NOTE — Telephone Encounter (Signed)
Called patient to let her know that she has to come in to pick up rx to do UDS again due to no medication in system.  Patient states that she takes the medication every day and has been for a long time.  She was upset and stated "so you think I am selling it".  I advised her again that she just needs to do UDS to pickup her medication.  She will be by.  She would like to speak with Dr. Etter Sjogren about this.  Dr. Etter Sjogren notified that patient would like to speak with her.

## 2016-12-17 NOTE — Telephone Encounter (Signed)
Pt came in office to pick up rx and had a UDS done, pt was requesting that she needed to speak with nurse regarding about her UDS and other concerns about this situation. Please advise.

## 2016-12-18 ENCOUNTER — Ambulatory Visit (HOSPITAL_BASED_OUTPATIENT_CLINIC_OR_DEPARTMENT_OTHER)
Admission: RE | Admit: 2016-12-18 | Discharge: 2016-12-18 | Disposition: A | Discharge status: Home / Self Care | Payer: Medicare Other | Source: Ambulatory Visit | Attending: Family Medicine | Admitting: Family Medicine

## 2016-12-18 DIAGNOSIS — M23251 Derangement of posterior horn of lateral meniscus due to old tear or injury, right knee: Secondary | ICD-10-CM | POA: Insufficient documentation

## 2016-12-18 DIAGNOSIS — M1711 Unilateral primary osteoarthritis, right knee: Secondary | ICD-10-CM | POA: Insufficient documentation

## 2016-12-18 DIAGNOSIS — M25561 Pain in right knee: Secondary | ICD-10-CM

## 2016-12-20 ENCOUNTER — Other Ambulatory Visit: Payer: Self-pay | Admitting: Family Medicine

## 2016-12-20 ENCOUNTER — Ambulatory Visit: Payer: Medicare Other | Admitting: Physical Therapy

## 2016-12-20 ENCOUNTER — Encounter: Payer: Self-pay | Admitting: Family Medicine

## 2016-12-20 DIAGNOSIS — Z780 Asymptomatic menopausal state: Secondary | ICD-10-CM

## 2016-12-20 NOTE — Telephone Encounter (Signed)
I was actually sitting right in front of Allison Bass when she talked to the pt.  She never accused her of selling med--- the pt is the one that brought it up ---- we are just trying to follow protochol Allison Bass wa very nice on the phone

## 2016-12-20 NOTE — Telephone Encounter (Signed)
Called left message to call back 

## 2016-12-20 NOTE — Telephone Encounter (Signed)
I spoke to the patient regarding the beginning of this message dated 12/17/2016.  She is very upset and felt the assistant that spoke to her was very accusatory towards her that she possible was selling the alprazolam she was prescribed.  She also states that in the past the has gotten a 6 month supply at a time and does not understand the change. I did try to explain changes in laws that the MD's are following, and she really did not believe me.  But mostly the way she was talked to upset her, she states she is certainly not selling it as needs it too much herself to do so.  I told her I would forward this message to PCP and she stated also that she would mychart message the provider.

## 2016-12-22 ENCOUNTER — Encounter: Payer: Self-pay | Admitting: Family Medicine

## 2016-12-22 ENCOUNTER — Ambulatory Visit: Payer: Medicare Other | Admitting: Physical Therapy

## 2016-12-22 ENCOUNTER — Ambulatory Visit (INDEPENDENT_AMBULATORY_CARE_PROVIDER_SITE_OTHER): Payer: Medicare Other | Admitting: Family Medicine

## 2016-12-22 DIAGNOSIS — R262 Difficulty in walking, not elsewhere classified: Secondary | ICD-10-CM

## 2016-12-22 DIAGNOSIS — R2689 Other abnormalities of gait and mobility: Secondary | ICD-10-CM

## 2016-12-22 DIAGNOSIS — M25561 Pain in right knee: Secondary | ICD-10-CM | POA: Diagnosis present

## 2016-12-22 MED ORDER — METHYLPREDNISOLONE ACETATE 40 MG/ML IJ SUSP
40.0000 mg | Freq: Once | INTRAMUSCULAR | Status: AC
  Administered 2016-12-22: 40 mg via INTRA_ARTICULAR

## 2016-12-22 NOTE — Assessment & Plan Note (Signed)
2/2 arthritis and lateral meniscus tear.  Here to try intraarticular injection.  Will continue physical therapy.  If still not improving with these measures will refer to ortho.  Knee brace, icing.    After informed written consent, patient was seated on exam table. Right knee was prepped with alcohol swab and utilizing anterolateral approach, patient's right knee was injected intraarticularly with 3:1 bupivicaine: depomedrol. Patient tolerated the procedure well without immediate complications.

## 2016-12-22 NOTE — Progress Notes (Signed)
PCP: Ann Held, DO  Subjective:   HPI: Patient is a 76 y.o. female here for right knee pain.  6/30: Patient reports on 6/2 she was at a wedding coming down some steps when her right knee buckled and she accidentally inverted her left ankle. Immediate pain, inability to bear weight. Had a lot of swelling. Went to ED same day - radiographs negative for fracture of ankle.  Radiographs of right knee with tiny loose body likely from arthropathy but no acute fracture - I am unable to pull up the images. 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 Written for physical therapy but hasn't started this and would like to go downstairs as more convenient for her. She tore right ACL in 1999 but did not have any surgical intervention for this. Occasionally gives way on her. No catching, locking. Using crutches because of ankle. Still swelling but has improved. Pain is 2/10 at rest, up to 7/10 with ambulation, sharp. No skin changes, numbness.  7/14: Patient reports she feels completely better. Did a week of physical therapy. Wearing ASO. Doing home exercises. Right knee still feels a little weak. Pain level 0/10. No skin changes, numbness.  12/03/16: Patient returns with right knee issues. She had been overall doing well. But states over past few weeks she's had worse problems going from sitting to standing. Pain is 0/10 currently, can be sharp laterally. Concerned because she is going on a trip to [place] in May. Feels like knee gives out on her at times. No locking or catching. No skin changes, numbness.  2/28: Patient returns for right knee injection. Pain level 2/10 currently, up to 4/10.  Past Medical History:  Diagnosis Date  . Anxiety   . Chicken pox   . Depression   . Epilepsy ([place])    controlled by Dilantin- last seizure 1995  . GERD (gastroesophageal reflux disease)   . Heart murmur   . HTN (hypertension)   . Migraine    . Seasonal allergies     Current Outpatient Prescriptions on File Prior to Visit  Medication Sig Dispense Refill  . ALPRAZolam (XANAX) 0.5 MG tablet Take 1 tablet (0.5 mg total) by mouth 3 (three) times daily. 90 tablet 0  . ARIPiprazole (ABILIFY) 2 MG tablet Take 1 tablet (2 mg total) by mouth daily. (Patient not taking: Reported on 12/14/2016) 90 tablet 1  . atorvastatin (LIPITOR) 40 MG tablet Take 1 tablet (40 mg total) by mouth daily. 90 tablet 1  . ciprofloxacin (CIPRO) 250 MG tablet Take 1 tablet (250 mg total) by mouth 2 (two) times daily. (Patient not taking: Reported on 12/14/2016) 6 tablet 0  . cyclobenzaprine (FLEXERIL) 10 MG tablet Take 1 tablet (10 mg total) by mouth 3 (three) times daily as needed for muscle spasms. (Patient not taking: Reported on 12/14/2016) 30 tablet 0  . desvenlafaxine (PRISTIQ) 100 MG 24 hr tablet Take 1 tablet (100 mg total) by mouth daily. 90 tablet 0  . DILANTIN 100 MG ER capsule Take 3 capsules (300 mg total) by mouth daily. 270 capsule 1  . hydrochlorothiazide (HYDRODIURIL) 50 MG tablet TAKE 1 TABLET(50 MG) BY MOUTH DAILY 90 tablet 0  . KLOR-CON M10 10 MEQ tablet TAKE TWO TABLETS BY MOUTH EVERY MORNING AND TAKE TWO TABLETS BY MOUTH EVERY EVENING 360 tablet 0  . lisinopril (PRINIVIL,ZESTRIL) 10 MG tablet Take 1 tablet (10 mg total) by mouth daily. 90 tablet 0  . PREMARIN  1.25 MG tablet TAKE ONE TABLET BY MOUTH ONE TIME DAILY 90 tablet 0  . Vilazodone HCl (VIIBRYD STARTER PACK) 10 & 20 MG KIT Take 10 mg by mouth daily. (Patient not taking: Reported on 12/14/2016) 1 kit 0   No current facility-administered medications on file prior to visit.     Past Surgical History:  Procedure Laterality Date  . ABDOMINAL HYSTERECTOMY    . APPENDECTOMY     with hysterectomy  . CATARACT EXTRACTION Bilateral   . LYMPHADENECTOMY     Benign, age 60  . TONSILLECTOMY AND ADENOIDECTOMY      Allergies  Allergen Reactions  . Erythromycin   . Codeine Rash    Makes the  patient walk into walls    Social History   Social History  . Marital status: Widowed    Spouse name: N/A  . Number of children: 3  . Years of education: N/A   Occupational History  . Retired     retired   Social History Main Topics  . Smoking status: Never Smoker  . Smokeless tobacco: Never Used  . Alcohol use 0.0 oz/week     Comment: Occ  . Drug use: No  . Sexual activity: Not on file   Other Topics Concern  . Not on file   Social History Narrative  . No narrative on file    Family History  Problem Relation Age of Onset  . Alcoholism Father   . Lung cancer Father     Smoker  . Stroke Father   . High blood pressure Father   . Breast cancer Maternal Grandmother   . Heart disease Mother   . High blood pressure Mother   . Depression Mother   . Seizures Mother   . Colon cancer Neg Hx   . Esophageal cancer Neg Hx   . Stomach cancer Neg Hx   . Rectal cancer Neg Hx     BP 123/78   Pulse 76   Ht _0  (1.651 m)   Wt 150 lb (68 kg)   BMI 24.96 kg/m   Review of Systems: See HPI above.    Objective:  Physical Exam:  Gen: NAD, comfortable in exam room  Right knee: No gross deformity, ecchymoses, effusion. TTP lateral joint line.  No other tenderness. FROM. 1+ ant drawer, lachmanns.  Negative post drawer.  Negative valgus/varus testing. Positive mcmurrays, apleys, sit home, thessalys.  Negative patellar apprehension. NV intact distally.    Assessment & Plan:  1. 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  Here to try intraarticular injection.  Will continue physical therapy.  If still not improving with these measures will refer to ortho.  Knee brace, icing.    After informed written consent, patient was seated on exam table. Right knee was prepped with alcohol swab and utilizing anterolateral approach, patient's right knee was injected intraarticularly with 3:1 bupivicaine: depomedrol. Patient tolerated the procedure well  without immediate complications.

## 2016-12-22 NOTE — Addendum Note (Signed)
Addended by: Sherrie George F on: 12/22/2016 04:11 PM   Modules accepted: Orders

## 2016-12-22 NOTE — Therapy (Signed)
[facility] [address]  [place], [place], [address] Phone: [phone]   Fax:  [phone]  Physical Therapy Treatment  Patient Details  Name: Allison Bass MRN: [id number] Date of Birth: 1941-02-06 Referring Provider: Dr. Barbaraann Barthel  Encounter Date: 12/22/2016      PT End of Session - 12/22/16 1615    Visit Number 3   Number of Visits 12   Date for PT Re-Evaluation 01/25/17   PT Start Time 1612   PT Stop Time 1653   PT Time Calculation (min) 41 min   Activity Tolerance Patient tolerated treatment well   Behavior During Therapy [facility] for tasks assessed/performed      Past Medical History:  Diagnosis Date  . Anxiety   . Chicken pox   . Depression   . Epilepsy ([place])    controlled by Dilantin- last seizure 1995  . GERD (gastroesophageal reflux disease)   . Heart murmur   . HTN (hypertension)   . Migraine   . Seasonal allergies     Past Surgical History:  Procedure Laterality Date  . ABDOMINAL HYSTERECTOMY    . APPENDECTOMY     with hysterectomy  . CATARACT EXTRACTION Bilateral   . LYMPHADENECTOMY     Benign, age 76  . TONSILLECTOMY AND ADENOIDECTOMY      There were no vitals filed for this visit.      Subjective Assessment - 12/22/16 1613    Subjective had injection earlier today - had MRI last weekend - arthritis and meniscal tear   Patient Stated Goals reduce pain, regain normal function   Currently in Pain? Yes   Pain Score 3    Pain Location Knee   Pain Orientation Right   Pain Descriptors / Indicators Aching;Sore   Pain Type Chronic pain                         [facility] Adult PT Treatment/Exercise - 12/22/16 1619      Knee/Hip Exercises: Aerobic   Nustep level 4 x 7 minutes     Knee/Hip Exercises: Machines for Strengthening   Cybex Knee Extension 25# x 15   Cybex Knee Flexion 25# x 15     Knee/Hip Exercises: Standing   Forward Step Up Right;15 reps;Hand  Hold: 1;Step Height: 4"   Functional Squat 15 reps   Functional Squat Limitations B UE support at counter     Knee/Hip Exercises: Seated   Long Arc [employer] Strengthening;Right;15 reps   Long Arc Quad Weight 2 lbs.   Hamstring Curl Strengthening;Right;15 reps   Hamstring Limitations red tband     Knee/Hip Exercises: Supine   Bridges Limitations B LE - 15 reps with 5" hold   Straight Leg Raises Right;15 reps     Knee/Hip Exercises: Sidelying   Clams RLE x 15 - 2#                     PT Long Term Goals - 12/15/16 1716      PT LONG TERM GOAL #1   Title Patient to be independent with HEP (01/24/17)   Status On-going     PT LONG TERM GOAL #2   Title Patient to demonstrate or report ability to ambulate for >30 minutes with no increase in pain (01/24/17)   Status On-going     PT LONG TERM GOAL #3   Title Patient to demonstrate ability to ascend/descend 1 flight  of stairs with good quad control and no increase in pain (01/24/17)   Status On-going     PT LONG TERM GOAL #4   Title Patient to improve R LE strength to >/= 4+/5 with no increase in pain (01/24/17)   Status On-going     PT LONG TERM GOAL #5   Title Patient to ambulate with appropriate gait mechanics with good heel toe gait pattern (01/24/17)   Status On-going               Plan - 12/22/16 1615    Clinical Impression Statement patient doing well today - did have MRI with results of R lateral meniscal tear and arthritic changes. Patient electing to proceed with conservative treatment of PT and injections by sports med MD. First injection today with patient reporting reduced pain, however does continue to have some catching in R knee joint. Patient doing well today with all strengthening activities with no increase in pain. Paitnet to benefit from continued PT to maximize function and mobility.    PT Treatment/Interventions ADLs/Self Care Home Management;Cryotherapy;Electrical Stimulation;Moist  Heat;Ultrasound;Iontophoresis 4mg /ml Dexamethasone;Neuromuscular re-education;Balance training;Therapeutic exercise;Therapeutic activities;Functional mobility training;Stair training;Gait training;Patient/family education;Manual techniques;Passive range of motion;Vasopneumatic Device;Taping;Dry needling   PT Next Visit Plan strengthening R knee   Consulted and Agree with Plan of Care Patient      Patient will benefit from skilled therapeutic intervention in order to improve the following deficits and impairments:  Abnormal gait, Decreased activity tolerance, Decreased balance, Decreased mobility, Decreased strength, Difficulty walking, Pain  Visit Diagnosis: Right knee pain, unspecified chronicity  Difficulty in walking, not elsewhere classified  Other abnormalities of gait and mobility     Problem List Patient Active Problem List   Diagnosis Date Noted  . 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 Injury of left ankle and foot 07/13/2016  . 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 Essential hypertension 05/17/2016  . Right knee pain 04/23/2016  . Ankle sprain 04/01/2016  . Low back pain 01/27/2016  . Faintness 01/27/2016  . Transient alteration of awareness 01/27/2016  . Poison ivy dermatitis 03/11/2015  . 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 Epilepsy ([place]) 02/24/2015     Lanney Gins, PT, DPT 12/22/16 6:07 PM   [facility] [address]  [place], [place], [address] Phone: [phone]   Fax:  [phone]  Name: Allison Bass MRN: [id number] Date of Birth: Mar 28, 1941

## 2016-12-27 ENCOUNTER — Ambulatory Visit (INDEPENDENT_AMBULATORY_CARE_PROVIDER_SITE_OTHER): Payer: Medicare Other | Admitting: Licensed Clinical Social Worker

## 2016-12-27 ENCOUNTER — Ambulatory Visit: Payer: Medicare Other | Admitting: Physical Therapy

## 2016-12-27 DIAGNOSIS — F3341 Major depressive disorder, recurrent, in partial remission: Secondary | ICD-10-CM

## 2016-12-29 ENCOUNTER — Ambulatory Visit: Payer: Medicare Other | Attending: Family Medicine | Admitting: Physical Therapy

## 2016-12-29 DIAGNOSIS — M25561 Pain in right knee: Secondary | ICD-10-CM

## 2016-12-29 DIAGNOSIS — R2689 Other abnormalities of gait and mobility: Secondary | ICD-10-CM

## 2016-12-29 DIAGNOSIS — R262 Difficulty in walking, not elsewhere classified: Secondary | ICD-10-CM | POA: Diagnosis not present

## 2016-12-29 NOTE — Therapy (Addendum)
[facility] [address]  [place], [place], [address] Phone: [phone]   Fax:  [phone]  Physical Therapy Treatment  Patient Details  Name: Allison Bass MRN: [id number] Date of Birth: 04-29-1941 Referring Provider: Dr. Barbaraann Barthel  Encounter Date: 12/29/2016      PT End of Session - 12/29/16 1725    Visit Number 4   Number of Visits 12   Date for PT Re-Evaluation 01/25/17   PT Start Time 1616   PT Stop Time 1654   PT Time Calculation (min) 38 min   Activity Tolerance Patient tolerated treatment well   Behavior During Therapy [facility] for tasks assessed/performed      Past Medical History:  Diagnosis Date  . Anxiety   . Chicken pox   . Depression   . Epilepsy ([place])    controlled by Dilantin- last seizure 1995  . GERD (gastroesophageal reflux disease)   . Heart murmur   . HTN (hypertension)   . Migraine   . Seasonal allergies     Past Surgical History:  Procedure Laterality Date  . ABDOMINAL HYSTERECTOMY    . APPENDECTOMY     with hysterectomy  . CATARACT EXTRACTION Bilateral   . LYMPHADENECTOMY     Benign, age 76  . TONSILLECTOMY AND ADENOIDECTOMY      There were no vitals filed for this visit.      Subjective Assessment - 12/29/16 1724    Subjective feeling well - going to water aerobics with ggod benefit   Diagnostic tests Xray - no diagnostic findings; scheduled for MRI on 12/18/13   Patient Stated Goals reduce pain, regain normal function   Currently in Pain? No/denies   Pain Score 0-No pain                         [facility] Adult PT Treatment/Exercise - 12/29/16 1635      Knee/Hip Exercises: Aerobic   Nustep level 5 x 6 minutes     Knee/Hip Exercises: Machines for Strengthening   Cybex Knee Extension 25# x 15   Cybex Knee Flexion 25# x 15     Knee/Hip Exercises: Standing   Forward Step Up Right;15 reps;Hand Hold: 0;Step Height: 6"     Knee/Hip Exercises:  Seated   Long Arc Quad Strengthening;Right;15 reps   Long Arc Quad Weight 3 lbs.   Long [employer] Limitations with adduction ball squeeze     Knee/Hip Exercises: Supine   Bridges Limitations B LE - 15 reps with 5" hold   Straight Leg Raises Right;15 reps   Straight Leg Raises Limitations 3#     Modalities   Modalities Iontophoresis     Iontophoresis   Type of Iontophoresis Dexamethasone   Location R lateral knee   Dose 1.0 mL   Time 80 mA, 4-6 hours                     PT Long Term Goals - 12/15/16 1716      PT LONG TERM GOAL #1   Title Patient to be independent with HEP (01/24/17)   Status On-going     PT LONG TERM GOAL #2   Title Patient to demonstrate or report ability to ambulate for >30 minutes with no increase in pain (01/24/17)   Status On-going     PT LONG TERM GOAL #3   Title Patient to demonstrate ability to ascend/descend 1 flight of  stairs with good quad control and no increase in pain (01/24/17)   Status On-going     PT LONG TERM GOAL #4   Title Patient to improve R LE strength to >/= 4+/5 with no increase in pain (01/24/17)   Status On-going     PT LONG TERM GOAL #5   Title Patient to ambulate with appropriate gait mechanics with good heel toe gait pattern (01/24/17)   Status On-going               Plan - 12/29/16 1726    Clinical Impression Statement patient reporting she has been routinely attending water aerobics with good benefit. Wants to wean down to 1x/week to work on land based strengthening. Patient much more tolerable to all exercise with no reproduction of pain during session.    PT Treatment/Interventions ADLs/Self Care Home Management;Cryotherapy;Electrical Stimulation;Moist Heat;Ultrasound;Iontophoresis 87m/ml Dexamethasone;Neuromuscular re-education;Balance training;Therapeutic exercise;Therapeutic activities;Functional mobility training;Stair training;Gait training;Patient/family education;Manual techniques;Passive range of  motion;Vasopneumatic Device;Taping;Dry needling   PT Next Visit Plan strengthening R knee   Consulted and Agree with Plan of Care Patient      Patient will benefit from skilled therapeutic intervention in order to improve the following deficits and impairments:  Abnormal gait, Decreased activity tolerance, Decreased balance, Decreased mobility, Decreased strength, Difficulty walking, Pain  Visit Diagnosis: Right knee pain, unspecified chronicity  Difficulty in walking, not elsewhere classified  Other abnormalities of gait and mobility     G-Codes     Functional Assessment Tool Used Clinical judgement   Functional Limitation Mobility: Walking and moving around   Mobility: Walking and Moving Around Discharge Status [phone] At least 20 percent but less than 40 percent impaired, limited or restricted   Mobility: Walking and Moving Around Goal Status [phone] At least 40 percent but less than 60 percent impaired, limited or restricted     Problem List Patient Active Problem List   Diagnosis Date Noted  . Localization-related idiopathic epilepsy and epileptic syndromes with seizures of localized onset, not intractable, without status epilepticus ([place] 09/09/2016  . Injury of left ankle and foot 07/13/2016  . 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 Essential hypertension 05/17/2016  . Right knee pain 04/23/2016  . Ankle sprain 04/01/2016  . Low back pain 01/27/2016  . Faintness 01/27/2016  . Transient alteration of awareness 01/27/2016  . Poison ivy dermatitis 03/11/2015  . 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 Epilepsy ([place] 02/24/2015     SLanney Gins PT, DPT 12/29/16 5:28 PM  PHYSICAL THERAPY DISCHARGE SUMMARY  Visits from Start of Care: 4  Current functional level related to goals / functional outcomes: See above   Remaining deficits: See above - some intermittent pain   Education / Equipment: HEP   Plan: Patient agrees to discharge.  Patient goals were not met. Patient is being discharged due to not returning since the last visit.  ?????    Patient not returning to PT for greater than 30 days. Will gladly see patient in the future for any other PT needs. Will require new referral.  SLanney Gins PT, DPT 01/31/17 8:29 AM    [facility] [place] [address]Phone: [phone]  Fax:  [phone] Name: PEriko EconomosMRN: [id number] of Birth: 1January 25, 1942

## 2016-12-30 ENCOUNTER — Encounter: Payer: Self-pay | Admitting: Physical Therapy

## 2017-01-05 ENCOUNTER — Ambulatory Visit: Payer: Medicare Other | Admitting: Physical Therapy

## 2017-01-11 ENCOUNTER — Other Ambulatory Visit: Payer: Self-pay | Admitting: Family Medicine

## 2017-01-11 ENCOUNTER — Encounter: Payer: Self-pay | Admitting: Family Medicine

## 2017-01-11 ENCOUNTER — Telehealth: Payer: Self-pay | Admitting: Family Medicine

## 2017-01-11 MED ORDER — HYDROCHLOROTHIAZIDE 50 MG PO TABS: ORAL_TABLET | ORAL | 2 refills | Status: DC

## 2017-01-11 MED ORDER — DESVENLAFAXINE SUCCINATE ER 100 MG PO TB24: 100.0000 mg | ORAL_TABLET | Freq: Every day | ORAL | 1 refills | Status: DC

## 2017-01-11 NOTE — Telephone Encounter (Signed)
Please refill pristiq --- she only needs annual visit for pristiq

## 2017-01-11 NOTE — Telephone Encounter (Signed)
Last appt with PCP was on 04/20/2016 no up coming scheduled appts. Advise if ok to refill pristiq as was denied this morning

## 2017-01-11 NOTE — Telephone Encounter (Signed)
Refill as instructed and patient contacted. Patient informed of appointment to be scheduled.

## 2017-01-11 NOTE — Telephone Encounter (Signed)
Problem is her uds have all been neg for xanax-----how many x a day is she taking it  If she is taking it regularly she should have some in her urine Allison Bass-- would you help with this And again explain the new law and office policy-- thank you

## 2017-01-11 NOTE — Telephone Encounter (Signed)
Pt called in very upset because she requested a refill from pharmacy and was advised that an office visit is needed before refill. Pt says that medication is for anxiety and that she does not want to continue to come in for ov just for continued refills. Advised pt that provider does have to follow up every so often to continue. Pt is upset and is requesting to speak with Dr. Etter Sjogren directly. Advised pt that I will have someone to call her back or either send PCP a mychart message directly. Pt says that she will NOT because she never get a response back when sending message via My Chart. Pt says that she don't want to speak to anyone else but PCP about this matter.   CB: [phone]

## 2017-01-11 NOTE — Addendum Note (Signed)
Addended by: Sharon Seller B on: 01/11/2017 01:38 PM   Modules accepted: Orders

## 2017-01-11 NOTE — Telephone Encounter (Signed)
Patient also is in need of her alprazolam to be refilled  Last refill on 12/17/2016  #90 Last office visit on 04/20/2016-- no scheduled upcoming appt. Contract signed on 10/13/2016 UDS last done on 12/16/16  THis patient has expressed concern/dissatisfaction on this process.  States she has to call every month for this refill/do contract/uds.  She states can she not get a 6 month refill.

## 2017-01-12 NOTE — Telephone Encounter (Signed)
Ok to give 1 month with 2 refills

## 2017-01-12 NOTE — Telephone Encounter (Signed)
Called Buckley with [facility] to discuss patient's situation.  The following information was provided:  Anderson Malta stated that based on the results she has, pt is showing a positive result for benzodiazepines, but the specific metabolite for alprazolam is not definitively detected.  Per Anderson Malta, she says that it is possible that the patient is taking it regularly.  However, if the patient has a high metabolism for the alprazolam metabolite, it may not show up. She also says that the cut off window for alprazolam metabolite is 100 and if patient's metabolite level is less than 100, then the alprazolam may be present in patient's system but there is no way to definitively say that.    Message was routed to PCP for her to advise.

## 2017-01-12 NOTE — Telephone Encounter (Signed)
Allison Bass with [facility] yesterday and left a voice message for call back.  Called Allison Bass again today and she picked up.  Allison Bass stated that based on the results she has, pt is showing a positive result for benzodiazepines, but the specific metabolite for alprazolam is not definitively detected.  Per Allison Bass, she says that it is possible that the patient is taking it regularly.  However, if the patient has a high metabolism for the alprazolam metabolite, it may not show up. She also says that the cut off window for alprazolam metabolite is 100 and if patient's metabolite level is less than 100, then the alprazolam may be present in patient's system but there is no way to definitively say that.    Please advise.

## 2017-01-12 NOTE — Telephone Encounter (Signed)
I know she is frustrated.  If she is taking it tid regularly it should show up in her urine.   Please call cole and see if he can find out if there is anyway that dose tid would not show up in the urine?   If Almyra Free is there today--- please let her know what is going on as well    Thanks

## 2017-01-13 ENCOUNTER — Other Ambulatory Visit: Payer: Self-pay | Admitting: Family Medicine

## 2017-01-13 MED ORDER — ALPRAZOLAM 0.5 MG PO TABS: 0.5000 mg | ORAL_TABLET | Freq: Three times a day (TID) | ORAL | 2 refills | Status: DC

## 2017-01-13 NOTE — Telephone Encounter (Signed)
Refills sent to pharmacy. 

## 2017-01-19 ENCOUNTER — Ambulatory Visit (INDEPENDENT_AMBULATORY_CARE_PROVIDER_SITE_OTHER): Payer: Medicare Other | Admitting: Family Medicine

## 2017-01-19 ENCOUNTER — Encounter: Payer: Self-pay | Admitting: Family Medicine

## 2017-01-19 VITALS — BP 123/70 | HR 76 | Ht 66.0 in | Wt 150.0 lb

## 2017-01-19 DIAGNOSIS — M25561 Pain in right knee: Secondary | ICD-10-CM | POA: Diagnosis not present

## 2017-01-19 DIAGNOSIS — M1711 Unilateral primary osteoarthritis, right knee: Secondary | ICD-10-CM

## 2017-01-19 MED ORDER — SODIUM HYALURONATE (VISCOSUP) 25 MG/2.5ML IX SOSY
2.5000 mL | PREFILLED_SYRINGE | Freq: Once | INTRA_ARTICULAR | Status: AC
  Administered 2017-01-19: 2.5 mL via INTRA_ARTICULAR

## 2017-01-21 NOTE — Progress Notes (Signed)
PCP: Ann Held, DO  Subjective:   HPI: Patient is a 76 y.o. female here for right knee pain.  6/30: Patient reports on 6/2 she was at a wedding coming down some steps when her right knee buckled and she accidentally inverted her left ankle. Immediate pain, inability to bear weight. Had a lot of swelling. Went to ED same day - radiographs negative for fracture of ankle.  Radiographs of right knee with tiny loose body likely from arthropathy but no acute fracture - I am unable to pull up the images. 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 Written for physical therapy but hasn't started this and would like to go downstairs as more convenient for her. She tore right ACL in 1999 but did not have any surgical intervention for this. Occasionally gives way on her. No catching, locking. Using crutches because of ankle. Still swelling but has improved. Pain is 2/10 at rest, up to 7/10 with ambulation, sharp. No skin changes, numbness.  7/14: Patient reports she feels completely better. Did a week of physical therapy. Wearing ASO. Doing home exercises. Right knee still feels a little weak. Pain level 0/10. No skin changes, numbness.  12/03/16: Patient returns with right knee issues. She had been overall doing well. But states over past few weeks she's had worse problems going from sitting to standing. Pain is 0/10 currently, can be sharp laterally. Concerned because she is going on a trip to [place] in May. Feels like knee gives out on her at times. No locking or catching. No skin changes, numbness.  2/28: Patient returns for right knee injection. Pain level 2/10 currently, up to 4/10.  3/28: Patient reports pain is 0/10, up to 5/10 at times. Doing water aerobics. Cortisone helped a little bit. Some slight swelling.  Past Medical History:  Diagnosis Date  . Anxiety   . Chicken pox   . Depression   . Epilepsy ([place])     controlled by Dilantin- last seizure 1995  . GERD (gastroesophageal reflux disease)   . Heart murmur   . HTN (hypertension)   . Migraine   . Seasonal allergies     Current Outpatient Prescriptions on File Prior to Visit  Medication Sig Dispense Refill  . ALPRAZolam (XANAX) 0.5 MG tablet Take 1 tablet (0.5 mg total) by mouth 3 (three) times daily. 90 tablet 2  . ARIPiprazole (ABILIFY) 2 MG tablet Take 1 tablet (2 mg total) by mouth daily. (Patient not taking: Reported on 12/14/2016) 90 tablet 1  . atorvastatin (LIPITOR) 40 MG tablet Take 1 tablet (40 mg total) by mouth daily. 90 tablet 1  . ciprofloxacin (CIPRO) 250 MG tablet Take 1 tablet (250 mg total) by mouth 2 (two) times daily. (Patient not taking: Reported on 12/14/2016) 6 tablet 0  . cyclobenzaprine (FLEXERIL) 10 MG tablet Take 1 tablet (10 mg total) by mouth 3 (three) times daily as needed for muscle spasms. (Patient not taking: Reported on 12/14/2016) 30 tablet 0  . desvenlafaxine (PRISTIQ) 100 MG 24 hr tablet Take 1 tablet (100 mg total) by mouth daily. 90 tablet 1  . DILANTIN 100 MG ER capsule Take 3 capsules (300 mg total) by mouth daily. 270 capsule 1  . hydrochlorothiazide (HYDRODIURIL) 50 MG tablet TAKE 1 TABLET(50 MG) BY MOUTH DAILY 90 tablet 2  . KLOR-CON M10 10 MEQ tablet TAKE TWO TABLETS BY MOUTH EVERY MORNING AND TAKE TWO TABLETS BY MOUTH EVERY EVENING 360 tablet  0  . lisinopril (PRINIVIL,ZESTRIL) 10 MG tablet Take 1 tablet (10 mg total) by mouth daily. 90 tablet 0  . PREMARIN 1.25 MG tablet TAKE ONE TABLET BY MOUTH ONE TIME DAILY 90 tablet 0  . Vilazodone HCl (VIIBRYD STARTER PACK) 10 & 20 MG KIT Take 10 mg by mouth daily. (Patient not taking: Reported on 12/14/2016) 1 kit 0   No current facility-administered medications on file prior to visit.     Past Surgical History:  Procedure Laterality Date  . ABDOMINAL HYSTERECTOMY    . APPENDECTOMY     with hysterectomy  . CATARACT EXTRACTION Bilateral   . LYMPHADENECTOMY      Benign, age 37  . TONSILLECTOMY AND ADENOIDECTOMY      Allergies  Allergen Reactions  . Erythromycin   . Codeine Rash    Makes the patient walk into walls    Social History   Social History  . Marital status: Widowed    Spouse name: N/A  . Number of children: 3  . Years of education: N/A   Occupational History  . Retired     retired   Social History Main Topics  . Smoking status: Never Smoker  . Smokeless tobacco: Never Used  . Alcohol use 0.0 oz/week     Comment: Occ  . Drug use: No  . Sexual activity: Not on file   Other Topics Concern  . Not on file   Social History Narrative  . No narrative on file    Family History  Problem Relation Age of Onset  . Alcoholism Father   . Lung cancer Father     Smoker  . Stroke Father   . High blood pressure Father   . Breast cancer Maternal Grandmother   . Heart disease Mother   . High blood pressure Mother   . Depression Mother   . Seizures Mother   . Colon cancer Neg Hx   . Esophageal cancer Neg Hx   . Stomach cancer Neg Hx   . Rectal cancer Neg Hx     BP 123/70   Pulse 76   Ht 5' 6" (1.676 m)   Wt 150 lb (68 kg)   BMI 24.21 kg/m   Review of Systems: See HPI above.    Objective:  Physical Exam:  Gen: NAD, comfortable in exam room  Exam not repeated today. Right knee: No gross deformity, ecchymoses, effusion. TTP lateral joint line.  No other tenderness. FROM. 1+ ant drawer, lachmanns.  Negative post drawer.  Negative valgus/varus testing. Positive mcmurrays, apleys, sit home, thessalys.  Negative patellar apprehension. NV intact distally.    Assessment & Plan:  1. 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  s/p cortisone injection.  Has done PT, home exercises.  We discussed referral to ortho - she would like to try supartz series.  Knee brace, icing.    After informed written consent, patient was seated on exam table. Right knee was prepped with alcohol swab and  utilizing anteromedial approach, patient's right knee was injected intraarticularly with 64m bupivicaine followed by supartz. Patient tolerated the procedure well without immediate complications.

## 2017-01-21 NOTE — Assessment & Plan Note (Signed)
2/2 arthritis and lateral meniscus tear.  s/p cortisone injection.  Has done PT, home exercises.  We discussed referral to ortho - she would like to try supartz series.  Knee brace, icing.    After informed written consent, patient was seated on exam table. Right knee was prepped with alcohol swab and utilizing anteromedial approach, patient's right knee was injected intraarticularly with 60mL bupivicaine followed by supartz. Patient tolerated the procedure well without immediate complications.

## 2017-01-26 ENCOUNTER — Ambulatory Visit (INDEPENDENT_AMBULATORY_CARE_PROVIDER_SITE_OTHER): Payer: Medicare Other | Admitting: Family Medicine

## 2017-01-26 ENCOUNTER — Encounter: Payer: Self-pay | Admitting: Family Medicine

## 2017-01-26 VITALS — BP 130/82 | HR 76 | Ht 66.0 in | Wt 150.0 lb

## 2017-01-26 DIAGNOSIS — M1711 Unilateral primary osteoarthritis, right knee: Secondary | ICD-10-CM | POA: Diagnosis present

## 2017-01-26 MED ORDER — SODIUM HYALURONATE (VISCOSUP) 25 MG/2.5ML IX SOSY
2.5000 mL | PREFILLED_SYRINGE | Freq: Once | INTRA_ARTICULAR | Status: AC
  Administered 2017-01-26: 2.5 mL via INTRA_ARTICULAR

## 2017-01-27 DIAGNOSIS — M1711 Unilateral primary osteoarthritis, right knee: Secondary | ICD-10-CM | POA: Insufficient documentation

## 2017-01-27 NOTE — Assessment & Plan Note (Signed)
2/2 arthritis and lateral meniscus tear.  s/p cortisone injection.  Has done PT, home exercises.  Second supartz injection given today - she is going to [place] next week - will follow up after that for third injection.  Knee brace, icing.    After informed written consent, patient was seated on exam table. Right knee was prepped with alcohol swab and utilizing anteromedial approach, patient's right knee was injected intraarticularly with 58mL bupivicaine followed by supartz. Patient tolerated the procedure well without immediate complications.

## 2017-01-27 NOTE — Progress Notes (Signed)
PCP: Ann Held, DO  Subjective:   HPI: Patient is a 76 y.o. female here for right knee pain.  6/30: Patient reports on 6/2 she was at a wedding coming down some steps when her right knee buckled and she accidentally inverted her left ankle. Immediate pain, inability to bear weight. Had a lot of swelling. Went to ED same day - radiographs negative for fracture of ankle.  Radiographs of right knee with tiny loose body likely from arthropathy but no acute fracture - I am unable to pull up the images. 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 Written for physical therapy but hasn't started this and would like to go downstairs as more convenient for her. She tore right ACL in 1999 but did not have any surgical intervention for this. Occasionally gives way on her. No catching, locking. Using crutches because of ankle. Still swelling but has improved. Pain is 2/10 at rest, up to 7/10 with ambulation, sharp. No skin changes, numbness.  7/14: Patient reports she feels completely better. Did a week of physical therapy. Wearing ASO. Doing home exercises. Right knee still feels a little weak. Pain level 0/10. No skin changes, numbness.  12/03/16: Patient returns with right knee issues. She had been overall doing well. But states over past few weeks she's had worse problems going from sitting to standing. Pain is 0/10 currently, can be sharp laterally. Concerned because she is going on a trip to [place] in May. Feels like knee gives out on her at times. No locking or catching. No skin changes, numbness.  2/28: Patient returns for right knee injection. Pain level 2/10 currently, up to 4/10.  3/28: Patient reports pain is 0/10, up to 5/10 at times. Doing water aerobics. Cortisone helped a little bit. Some slight swelling.  4/4: Patient reports pain level is 3/10 at worst. Injection helped a lot up until today. No skin changes,  numbness.  Past Medical History:  Diagnosis Date  . Anxiety   . Chicken pox   . Depression   . Epilepsy ([place])    controlled by Dilantin- last seizure 1995  . GERD (gastroesophageal reflux disease)   . Heart murmur   . HTN (hypertension)   . Migraine   . Seasonal allergies     Current Outpatient Prescriptions on File Prior to Visit  Medication Sig Dispense Refill  . ALPRAZolam (XANAX) 0.5 MG tablet Take 1 tablet (0.5 mg total) by mouth 3 (three) times daily. 90 tablet 2  . ARIPiprazole (ABILIFY) 2 MG tablet Take 1 tablet (2 mg total) by mouth daily. (Patient not taking: Reported on 12/14/2016) 90 tablet 1  . atorvastatin (LIPITOR) 40 MG tablet Take 1 tablet (40 mg total) by mouth daily. 90 tablet 1  . ciprofloxacin (CIPRO) 250 MG tablet Take 1 tablet (250 mg total) by mouth 2 (two) times daily. (Patient not taking: Reported on 12/14/2016) 6 tablet 0  . cyclobenzaprine (FLEXERIL) 10 MG tablet Take 1 tablet (10 mg total) by mouth 3 (three) times daily as needed for muscle spasms. (Patient not taking: Reported on 12/14/2016) 30 tablet 0  . desvenlafaxine (PRISTIQ) 100 MG 24 hr tablet Take 1 tablet (100 mg total) by mouth daily. 90 tablet 1  . DILANTIN 100 MG ER capsule Take 3 capsules (300 mg total) by mouth daily. 270 capsule 1  . hydrochlorothiazide (HYDRODIURIL) 50 MG tablet TAKE 1 TABLET(50 MG) BY MOUTH DAILY 90 tablet 2  . KLOR-CON  M10 10 MEQ tablet TAKE TWO TABLETS BY MOUTH EVERY MORNING AND TAKE TWO TABLETS BY MOUTH EVERY EVENING 360 tablet 0  . lisinopril (PRINIVIL,ZESTRIL) 10 MG tablet Take 1 tablet (10 mg total) by mouth daily. 90 tablet 0  . PREMARIN 1.25 MG tablet TAKE ONE TABLET BY MOUTH ONE TIME DAILY 90 tablet 0  . Vilazodone HCl (VIIBRYD STARTER PACK) 10 & 20 MG KIT Take 10 mg by mouth daily. (Patient not taking: Reported on 12/14/2016) 1 kit 0   No current facility-administered medications on file prior to visit.     Past Surgical History:  Procedure Laterality Date  .  ABDOMINAL HYSTERECTOMY    . APPENDECTOMY     with hysterectomy  . CATARACT EXTRACTION Bilateral   . LYMPHADENECTOMY     Benign, age 87  . TONSILLECTOMY AND ADENOIDECTOMY      Allergies  Allergen Reactions  . Erythromycin   . Codeine Rash    Makes the patient walk into walls    Social History   Social History  . Marital status: Widowed    Spouse name: N/A  . Number of children: 3  . Years of education: N/A   Occupational History  . Retired     retired   Social History Main Topics  . Smoking status: Never Smoker  . Smokeless tobacco: Never Used  . Alcohol use 0.0 oz/week     Comment: Occ  . Drug use: No  . Sexual activity: Not on file   Other Topics Concern  . Not on file   Social History Narrative  . No narrative on file    Family History  Problem Relation Age of Onset  . Alcoholism Father   . Lung cancer Father     Smoker  . Stroke Father   . High blood pressure Father   . Breast cancer Maternal Grandmother   . Heart disease Mother   . High blood pressure Mother   . Depression Mother   . Seizures Mother   . Colon cancer Neg Hx   . Esophageal cancer Neg Hx   . Stomach cancer Neg Hx   . Rectal cancer Neg Hx     BP 130/82   Pulse 76   Ht 5' 6"  (1.676 m)   Wt 150 lb (68 kg)   BMI 24.21 kg/m   Review of Systems: See HPI above.    Objective:  Physical Exam:  Gen: NAD, comfortable in exam room  Exam not repeated today. Right knee: No gross deformity, ecchymoses, effusion. TTP lateral joint line.  No other tenderness. FROM. 1+ ant drawer, lachmanns.  Negative post drawer.  Negative valgus/varus testing. Positive mcmurrays, apleys, sit home, thessalys.  Negative patellar apprehension. NV intact distally.    Assessment & Plan:  1. 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  s/p cortisone injection.  Has done PT, home exercises.  Second supartz injection given today - she is going to [place] next week - will  follow up after that for third injection.  Knee brace, icing.    After informed written consent, patient was seated on exam table. Right knee was prepped with alcohol swab and utilizing anteromedial approach, patient's right knee was injected intraarticularly with 11m bupivicaine followed by supartz. Patient tolerated the procedure well without immediate complications.

## 2017-01-28 ENCOUNTER — Ambulatory Visit: Payer: Medicare Other | Admitting: Licensed Clinical Social Worker

## 2017-01-31 ENCOUNTER — Encounter: Payer: Self-pay | Admitting: Family Medicine

## 2017-01-31 ENCOUNTER — Ambulatory Visit (INDEPENDENT_AMBULATORY_CARE_PROVIDER_SITE_OTHER): Payer: Medicare Other | Admitting: Family Medicine

## 2017-01-31 VITALS — BP 119/65 | HR 79 | Ht 66.0 in | Wt 150.0 lb

## 2017-01-31 DIAGNOSIS — M1711 Unilateral primary osteoarthritis, right knee: Secondary | ICD-10-CM | POA: Diagnosis present

## 2017-01-31 MED ORDER — SODIUM HYALURONATE (VISCOSUP) 25 MG/2.5ML IX SOSY
2.5000 mL | PREFILLED_SYRINGE | Freq: Once | INTRA_ARTICULAR | Status: AC
  Administered 2017-01-31: 2.5 mL via INTRA_ARTICULAR

## 2017-02-01 NOTE — Progress Notes (Signed)
PCP: Ann Held, DO  Subjective:   HPI: Patient is a 76 y.o. female here for right knee pain.  6/30: Patient reports on 6/2 she was at a wedding coming down some steps when her right knee buckled and she accidentally inverted her left ankle. Immediate pain, inability to bear weight. Had a lot of swelling. Went to ED same day - radiographs negative for fracture of ankle.  Radiographs of right knee with tiny loose body likely from arthropathy but no acute fracture - I am unable to pull up the images. 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 Written for physical therapy but hasn't started this and would like to go downstairs as more convenient for her. She tore right ACL in 1999 but did not have any surgical intervention for this. Occasionally gives way on her. No catching, locking. Using crutches because of ankle. Still swelling but has improved. Pain is 2/10 at rest, up to 7/10 with ambulation, sharp. No skin changes, numbness.  7/14: Patient reports she feels completely better. Did a week of physical therapy. Wearing ASO. Doing home exercises. Right knee still feels a little weak. Pain level 0/10. No skin changes, numbness.  12/03/16: Patient returns with right knee issues. She had been overall doing well. But states over past few weeks she's had worse problems going from sitting to standing. Pain is 0/10 currently, can be sharp laterally. Concerned because she is going on a trip to [place] in May. Feels like knee gives out on her at times. No locking or catching. No skin changes, numbness.  2/28: Patient returns for right knee injection. Pain level 2/10 currently, up to 4/10.  3/28: Patient reports pain is 0/10, up to 5/10 at times. Doing water aerobics. Cortisone helped a little bit. Some slight swelling.  4/4: Patient reports pain level is 3/10 at worst. Injection helped a lot up until today. No skin changes,  numbness.  4/9: Patient reports pain is improved - 0/10 level now. No skin changes, numbness.  Past Medical History:  Diagnosis Date  . Anxiety   . Chicken pox   . Depression   . 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 GERD (gastroesophageal reflux disease)   . Heart murmur   . HTN (hypertension)   . Migraine   . Seasonal allergies     Current Outpatient Prescriptions on File Prior to Visit  Medication Sig Dispense Refill  . ALPRAZolam (XANAX) 0.5 MG tablet Take 1 tablet (0.5 mg total) by mouth 3 (three) times daily. 90 tablet 2  . ARIPiprazole (ABILIFY) 2 MG tablet Take 1 tablet (2 mg total) by mouth daily. (Patient not taking: Reported on 12/14/2016) 90 tablet 1  . atorvastatin (LIPITOR) 40 MG tablet Take 1 tablet (40 mg total) by mouth daily. 90 tablet 1  . ciprofloxacin (CIPRO) 250 MG tablet Take 1 tablet (250 mg total) by mouth 2 (two) times daily. (Patient not taking: Reported on 12/14/2016) 6 tablet 0  . cyclobenzaprine (FLEXERIL) 10 MG tablet Take 1 tablet (10 mg total) by mouth 3 (three) times daily as needed for muscle spasms. (Patient not taking: Reported on 12/14/2016) 30 tablet 0  . desvenlafaxine (PRISTIQ) 100 MG 24 hr tablet Take 1 tablet (100 mg total) by mouth daily. 90 tablet 1  . DILANTIN 100 MG ER capsule Take 3 capsules (300 mg total) by mouth daily. 270 capsule 1  . hydrochlorothiazide (HYDRODIURIL) 50  MG tablet TAKE 1 TABLET(50 MG) BY MOUTH DAILY 90 tablet 2  . KLOR-CON M10 10 MEQ tablet TAKE TWO TABLETS BY MOUTH EVERY MORNING AND TAKE TWO TABLETS BY MOUTH EVERY EVENING 360 tablet 0  . lisinopril (PRINIVIL,ZESTRIL) 10 MG tablet Take 1 tablet (10 mg total) by mouth daily. 90 tablet 0  . PREMARIN 1.25 MG tablet TAKE ONE TABLET BY MOUTH ONE TIME DAILY 90 tablet 0  . Vilazodone HCl (VIIBRYD STARTER PACK) 10 & 20 MG KIT Take 10 mg by mouth daily. (Patient not taking: Reported on 12/14/2016) 1 kit 0   No current facility-administered medications  on file prior to visit.     Past Surgical History:  Procedure Laterality Date  . ABDOMINAL HYSTERECTOMY    . APPENDECTOMY     with hysterectomy  . CATARACT EXTRACTION Bilateral   . LYMPHADENECTOMY     Benign, age 59  . TONSILLECTOMY AND ADENOIDECTOMY      Allergies  Allergen Reactions  . Erythromycin   . Codeine Rash    Makes the patient walk into walls    Social History   Social History  . Marital status: Widowed    Spouse name: N/A  . Number of children: 3  . Years of education: N/A   Occupational History  . Retired     retired   Social History Main Topics  . Smoking status: Never Smoker  . Smokeless tobacco: Never Used  . Alcohol use 0.0 oz/week     Comment: Occ  . Drug use: No  . Sexual activity: Not on file   Other Topics Concern  . Not on file   Social History Narrative  . No narrative on file    Family History  Problem Relation Age of Onset  . Alcoholism Father   . Lung cancer Father     Smoker  . Stroke Father   . High blood pressure Father   . Breast cancer Maternal Grandmother   . Heart disease Mother   . High blood pressure Mother   . Depression Mother   . Seizures Mother   . Colon cancer Neg Hx   . Esophageal cancer Neg Hx   . Stomach cancer Neg Hx   . Rectal cancer Neg Hx     BP 119/65   Pulse 79   Ht 5' 6"  (1.676 m)   Wt 150 lb (68 kg)   BMI 24.21 kg/m   Review of Systems: See HPI above.    Objective:  Physical Exam:  Gen: NAD, comfortable in exam room  Exam not repeated today. Right knee: No gross deformity, ecchymoses, effusion. TTP lateral joint line.  No other tenderness. FROM. 1+ ant drawer, lachmanns.  Negative post drawer.  Negative valgus/varus testing. Positive mcmurrays, apleys, sit home, thessalys.  Negative patellar apprehension. NV intact distally.    Assessment & Plan:  1. 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  s/p cortisone injection.  Has done PT, home exercises.   Third supartz injection given today - went ahead with this prior to [place] trip instead.  She is going to consider whether she wants 4th, 5th supartz injections and/or cortisone prior to leaving for trip to [place].  Knee brace, icing.    After informed written consent, patient was seated on exam table. Right knee was prepped with alcohol swab and utilizing anteromedial approach, patient's right knee was injected intraarticularly with 49m bupivicaine followed by supartz. Patient tolerated the procedure well without immediate complications.

## 2017-02-01 NOTE — Assessment & Plan Note (Signed)
2/2 arthritis and lateral meniscus tear.  s/p cortisone injection.  Has done PT, home exercises.  Third supartz injection given today - went ahead with this prior to [place] trip instead.  She is going to consider whether she wants 4th, 5th supartz injections and/or cortisone prior to leaving for trip to [place].  Knee brace, icing.    After informed written consent, patient was seated on exam table. Right knee was prepped with alcohol swab and utilizing anteromedial approach, patient's right knee was injected intraarticularly with 22mL bupivicaine followed by supartz. Patient tolerated the procedure well without immediate complications.

## 2017-02-25 ENCOUNTER — Ambulatory Visit (INDEPENDENT_AMBULATORY_CARE_PROVIDER_SITE_OTHER): Payer: Medicare Other | Admitting: Family Medicine

## 2017-02-25 ENCOUNTER — Encounter: Payer: Self-pay | Admitting: Family Medicine

## 2017-02-25 VITALS — BP 148/65 | HR 76 | Ht 66.0 in | Wt 150.0 lb

## 2017-02-25 DIAGNOSIS — M1711 Unilateral primary osteoarthritis, right knee: Secondary | ICD-10-CM | POA: Diagnosis present

## 2017-02-25 DIAGNOSIS — M25561 Pain in right knee: Secondary | ICD-10-CM

## 2017-02-25 MED ORDER — METHYLPREDNISOLONE ACETATE 40 MG/ML IJ SUSP
40.0000 mg | Freq: Once | INTRAMUSCULAR | Status: AC
  Administered 2017-02-25: 40 mg via INTRA_ARTICULAR

## 2017-02-27 NOTE — Assessment & Plan Note (Signed)
2/2 arthritis and lateral meniscus tear.  Repeated cortisone injection today in preparation for her trip.  s/p prior injection, supartz series, PT, home exercises.  Knee brace, icing.    After informed written consent, patient was seated on exam table. Right knee was prepped with alcohol swab and utilizing anteromedial approach, patient's right knee was injected intraarticularly with 3:62mL bupivicaine:depomedrol. Patient tolerated the procedure well without immediate complications.

## 2017-02-27 NOTE — Progress Notes (Signed)
PCP: Ann Held, DO  Subjective:   HPI: Patient is a 76 y.o. female here for right knee pain.  6/30: Patient reports on 6/2 she was at a wedding coming down some steps when her right knee buckled and she accidentally inverted her left ankle. Immediate pain, inability to bear weight. Had a lot of swelling. Went to ED same day - radiographs negative for fracture of ankle.  Radiographs of right knee with tiny loose body likely from arthropathy but no acute fracture - I am unable to pull up the images. 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 Written for physical therapy but hasn't started this and would like to go downstairs as more convenient for her. She tore right ACL in 1999 but did not have any surgical intervention for this. Occasionally gives way on her. No catching, locking. Using crutches because of ankle. Still swelling but has improved. Pain is 2/10 at rest, up to 7/10 with ambulation, sharp. No skin changes, numbness.  7/14: Patient reports she feels completely better. Did a week of physical therapy. Wearing ASO. Doing home exercises. Right knee still feels a little weak. Pain level 0/10. No skin changes, numbness.  12/03/16: Patient returns with right knee issues. She had been overall doing well. But states over past few weeks she's had worse problems going from sitting to standing. Pain is 0/10 currently, can be sharp laterally. Concerned because she is going on a trip to [place] in May. Feels like knee gives out on her at times. No locking or catching. No skin changes, numbness.  2/28: Patient returns for right knee injection. Pain level 2/10 currently, up to 4/10.  3/28: Patient reports pain is 0/10, up to 5/10 at times. Doing water aerobics. Cortisone helped a little bit. Some slight swelling.  4/4: Patient reports pain level is 3/10 at worst. Injection helped a lot up until today. No skin changes,  numbness.  4/9: Patient reports pain is improved - 0/10 level now. No skin changes, numbness.  5/4: Patient reports she is doing well - pain still 3/10 and leaves for her trip to [place] this weekend. No skin changes, numbness.  Past Medical History:  Diagnosis Date  . Anxiety   . Chicken pox   . Depression   . 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 GERD (gastroesophageal reflux disease)   . Heart murmur   . HTN (hypertension)   . Migraine   . Seasonal allergies     Current Outpatient Prescriptions on File Prior to Visit  Medication Sig Dispense Refill  . ALPRAZolam (XANAX) 0.5 MG tablet Take 1 tablet (0.5 mg total) by mouth 3 (three) times daily. 90 tablet 2  . ARIPiprazole (ABILIFY) 2 MG tablet Take 1 tablet (2 mg total) by mouth daily. (Patient not taking: Reported on 12/14/2016) 90 tablet 1  . atorvastatin (LIPITOR) 40 MG tablet Take 1 tablet (40 mg total) by mouth daily. 90 tablet 1  . ciprofloxacin (CIPRO) 250 MG tablet Take 1 tablet (250 mg total) by mouth 2 (two) times daily. (Patient not taking: Reported on 12/14/2016) 6 tablet 0  . cyclobenzaprine (FLEXERIL) 10 MG tablet Take 1 tablet (10 mg total) by mouth 3 (three) times daily as needed for muscle spasms. (Patient not taking: Reported on 12/14/2016) 30 tablet 0  . desvenlafaxine (PRISTIQ) 100 MG 24 hr tablet Take 1 tablet (100 mg total) by mouth daily. 90 tablet  1  . DILANTIN 100 MG ER capsule Take 3 capsules (300 mg total) by mouth daily. 270 capsule 1  . hydrochlorothiazide (HYDRODIURIL) 50 MG tablet TAKE 1 TABLET(50 MG) BY MOUTH DAILY 90 tablet 2  . KLOR-CON M10 10 MEQ tablet TAKE TWO TABLETS BY MOUTH EVERY MORNING AND TAKE TWO TABLETS BY MOUTH EVERY EVENING 360 tablet 0  . lisinopril (PRINIVIL,ZESTRIL) 10 MG tablet Take 1 tablet (10 mg total) by mouth daily. 90 tablet 0  . PREMARIN 1.25 MG tablet TAKE ONE TABLET BY MOUTH ONE TIME DAILY 90 tablet 0  . Vilazodone HCl (VIIBRYD STARTER PACK) 10 &  20 MG KIT Take 10 mg by mouth daily. (Patient not taking: Reported on 12/14/2016) 1 kit 0   No current facility-administered medications on file prior to visit.     Past Surgical History:  Procedure Laterality Date  . ABDOMINAL HYSTERECTOMY    . APPENDECTOMY     with hysterectomy  . CATARACT EXTRACTION Bilateral   . LYMPHADENECTOMY     Benign, age 16  . TONSILLECTOMY AND ADENOIDECTOMY      Allergies  Allergen Reactions  . Erythromycin   . Codeine Rash    Makes the patient walk into walls    Social History   Social History  . Marital status: Widowed    Spouse name: N/A  . Number of children: 3  . Years of education: N/A   Occupational History  . Retired     retired   Social History Main Topics  . Smoking status: Never Smoker  . Smokeless tobacco: Never Used  . Alcohol use 0.0 oz/week     Comment: Occ  . Drug use: No  . Sexual activity: Not on file   Other Topics Concern  . Not on file   Social History Narrative  . No narrative on file    Family History  Problem Relation Age of Onset  . Alcoholism Father   . Lung cancer Father     Smoker  . Stroke Father   . High blood pressure Father   . Breast cancer Maternal Grandmother   . Heart disease Mother   . High blood pressure Mother   . Depression Mother   . Seizures Mother   . Colon cancer Neg Hx   . Esophageal cancer Neg Hx   . Stomach cancer Neg Hx   . Rectal cancer Neg Hx     BP (!) 148/65   Pulse 76   Ht 5' 6"  (1.676 m)   Wt 150 lb (68 kg)   BMI 24.21 kg/m   Review of Systems: See HPI above.    Objective:  Physical Exam:  Gen: NAD, comfortable in exam room  Exam not repeated today. Right knee: No gross deformity, ecchymoses, effusion. TTP lateral joint line.  No other tenderness. FROM. 1+ ant drawer, lachmanns.  Negative post drawer.  Negative valgus/varus testing. Positive mcmurrays, apleys, sit home, thessalys.  Negative patellar apprehension. NV intact distally.    Assessment  & Plan:  1. 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  Repeated cortisone injection today in preparation for her trip.  s/p prior injection, supartz series, PT, home exercises.  Knee brace, icing.    After informed written consent, patient was seated on exam table. Right knee was prepped with alcohol swab and utilizing anteromedial approach, patient's right knee was injected intraarticularly with 3:50m bupivicaine:depomedrol. Patient tolerated the procedure well without immediate complications.

## 2017-03-08 ENCOUNTER — Ambulatory Visit: Payer: Self-pay | Admitting: Neurology

## 2017-03-10 ENCOUNTER — Telehealth: Payer: Self-pay | Admitting: Family Medicine

## 2017-03-10 NOTE — Telephone Encounter (Signed)
Spoke to patient and she would like to do 4th and 5th gel injections. Will place order.

## 2017-03-10 NOTE — Telephone Encounter (Signed)
We could do 4th, 5th supartz injections if she wanted.

## 2017-03-10 NOTE — Telephone Encounter (Signed)
Patient calling requesting to speak to nurse about receiving a gel injection in her knee. Patients states she is still experiencing pain

## 2017-03-12 ENCOUNTER — Other Ambulatory Visit: Payer: Self-pay | Admitting: Family Medicine

## 2017-03-12 DIAGNOSIS — I10 Essential (primary) hypertension: Secondary | ICD-10-CM

## 2017-03-13 ENCOUNTER — Other Ambulatory Visit: Payer: Self-pay | Admitting: Family Medicine

## 2017-03-13 DIAGNOSIS — Z780 Asymptomatic menopausal state: Secondary | ICD-10-CM

## 2017-03-14 ENCOUNTER — Other Ambulatory Visit: Payer: Self-pay | Admitting: Family Medicine

## 2017-03-14 MED ORDER — ATORVASTATIN CALCIUM 40 MG PO TABS: 40.0000 mg | ORAL_TABLET | Freq: Every day | ORAL | 1 refills | Status: DC

## 2017-03-15 ENCOUNTER — Ambulatory Visit (INDEPENDENT_AMBULATORY_CARE_PROVIDER_SITE_OTHER): Payer: Medicare Other | Admitting: Family Medicine

## 2017-03-15 ENCOUNTER — Encounter: Payer: Self-pay | Admitting: Family Medicine

## 2017-03-15 VITALS — BP 130/73 | HR 80 | Ht 66.0 in | Wt 154.0 lb

## 2017-03-15 DIAGNOSIS — M1711 Unilateral primary osteoarthritis, right knee: Secondary | ICD-10-CM | POA: Diagnosis present

## 2017-03-15 MED ORDER — SODIUM HYALURONATE (VISCOSUP) 25 MG/2.5ML IX SOSY
2.5000 mL | PREFILLED_SYRINGE | Freq: Once | INTRA_ARTICULAR | Status: AC
  Administered 2017-03-15: 2.5 mL via INTRA_ARTICULAR

## 2017-03-15 NOTE — Patient Instructions (Signed)
Follow up with me in 1 week for the 5th injection. We will refer you to Dr. Ninfa Linden to discuss surgical options for your knee.

## 2017-03-18 NOTE — Progress Notes (Signed)
PCP: Ann Held, DO  Subjective:   HPI: Patient is a 76 y.o. female here for right knee pain.  6/30: Patient reports on 6/2 she was at a wedding coming down some steps when her right knee buckled and she accidentally inverted her left ankle. Immediate pain, inability to bear weight. Had a lot of swelling. Went to ED same day - radiographs negative for fracture of ankle.  Radiographs of right knee with tiny loose body likely from arthropathy but no acute fracture - I am unable to pull up the images. 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 Written for physical therapy but hasn't started this and would like to go downstairs as more convenient for her. She tore right ACL in 1999 but did not have any surgical intervention for this. Occasionally gives way on her. No catching, locking. Using crutches because of ankle. Still swelling but has improved. Pain is 2/10 at rest, up to 7/10 with ambulation, sharp. No skin changes, numbness.  7/14: Patient reports she feels completely better. Did a week of physical therapy. Wearing ASO. Doing home exercises. Right knee still feels a little weak. Pain level 0/10. No skin changes, numbness.  12/03/16: Patient returns with right knee issues. She had been overall doing well. But states over past few weeks she's had worse problems going from sitting to standing. Pain is 0/10 currently, can be sharp laterally. Concerned because she is going on a trip to [place] in May. Feels like knee gives out on her at times. No locking or catching. No skin changes, numbness.  2/28: Patient returns for right knee injection. Pain level 2/10 currently, up to 4/10.  3/28: Patient reports pain is 0/10, up to 5/10 at times. Doing water aerobics. Cortisone helped a little bit. Some slight swelling.  4/4: Patient reports pain level is 3/10 at worst. Injection helped a lot up until today. No skin changes,  numbness.  4/9: Patient reports pain is improved - 0/10 level now. No skin changes, numbness.  5/4: Patient reports she is doing well - pain still 3/10 and leaves for her trip to [place] this weekend. No skin changes, numbness.  5/22: Patient struggled on her trip with knee pain. Was a lot of walking and tour guide didn't stop to give breaks when needed. Pain level is 5/10. No skin changes, numbness.  Past Medical History:  Diagnosis Date  . Anxiety   . Chicken pox   . Depression   . 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 GERD (gastroesophageal reflux disease)   . Heart murmur   . HTN (hypertension)   . Migraine   . Seasonal allergies     Current Outpatient Prescriptions on File Prior to Visit  Medication Sig Dispense Refill  . ALPRAZolam (XANAX) 0.5 MG tablet Take 1 tablet (0.5 mg total) by mouth 3 (three) times daily. 90 tablet 2  . ARIPiprazole (ABILIFY) 2 MG tablet Take 1 tablet (2 mg total) by mouth daily. (Patient not taking: Reported on 12/14/2016) 90 tablet 1  . atorvastatin (LIPITOR) 40 MG tablet Take 1 tablet (40 mg total) by mouth daily. 90 tablet 1  . ciprofloxacin (CIPRO) 250 MG tablet Take 1 tablet (250 mg total) by mouth 2 (two) times daily. (Patient not taking: Reported on 12/14/2016) 6 tablet 0  . cyclobenzaprine (FLEXERIL) 10 MG tablet Take 1 tablet (10 mg total) by mouth 3 (three) times daily as  needed for muscle spasms. (Patient not taking: Reported on 12/14/2016) 30 tablet 0  . desvenlafaxine (PRISTIQ) 100 MG 24 hr tablet Take 1 tablet (100 mg total) by mouth daily. 90 tablet 1  . DILANTIN 100 MG ER capsule TAKE THREE CAPSULES BY MOUTH ONE TIME DAILY 270 capsule 1  . hydrochlorothiazide (HYDRODIURIL) 50 MG tablet TAKE 1 TABLET(50 MG) BY MOUTH DAILY 90 tablet 2  . KLOR-CON M10 10 MEQ tablet TAKE TWO TABLETS BY MOUTH EVERY MORNING AND TAKE TWO TABLETS BY MOUTH EVERY EVENING 360 tablet 0  . lisinopril (PRINIVIL,ZESTRIL) 10 MG tablet TAKE  ONE TABLET BY MOUTH ONE TIME DAILY 90 tablet 0  . PREMARIN 1.25 MG tablet TAKE ONE TABLET BY MOUTH ONE TIME DAILY 90 tablet 0  . Vilazodone HCl (VIIBRYD STARTER PACK) 10 & 20 MG KIT Take 10 mg by mouth daily. (Patient not taking: Reported on 12/14/2016) 1 kit 0   No current facility-administered medications on file prior to visit.     Past Surgical History:  Procedure Laterality Date  . ABDOMINAL HYSTERECTOMY    . APPENDECTOMY     with hysterectomy  . CATARACT EXTRACTION Bilateral   . LYMPHADENECTOMY     Benign, age 56  . TONSILLECTOMY AND ADENOIDECTOMY      Allergies  Allergen Reactions  . Erythromycin   . Codeine Rash    Makes the patient walk into walls    Social History   Social History  . Marital status: Widowed    Spouse name: N/A  . Number of children: 3  . Years of education: N/A   Occupational History  . Retired     retired   Social History Main Topics  . Smoking status: Never Smoker  . Smokeless tobacco: Never Used  . Alcohol use 0.0 oz/week     Comment: Occ  . Drug use: No  . Sexual activity: Not on file   Other Topics Concern  . Not on file   Social History Narrative  . No narrative on file    Family History  Problem Relation Age of Onset  . Alcoholism Father   . Lung cancer Father        Smoker  . Stroke Father   . High blood pressure Father   . Breast cancer Maternal Grandmother   . Heart disease Mother   . High blood pressure Mother   . Depression Mother   . Seizures Mother   . Colon cancer Neg Hx   . Esophageal cancer Neg Hx   . Stomach cancer Neg Hx   . Rectal cancer Neg Hx     BP 130/73   Pulse 80   Ht 5' 6"  (1.676 m)   Wt 154 lb (69.9 kg)   BMI 24.86 kg/m   Review of Systems: See HPI above.    Objective:  Physical Exam:  Gen: NAD, comfortable in exam room  Exam not repeated today. Right knee: No gross deformity, ecchymoses, effusion. TTP lateral joint line.  No other tenderness. FROM. 1+ ant drawer, lachmanns.   Negative post drawer.  Negative valgus/varus testing. Positive mcmurrays, apleys, sit home, thessalys.  Negative patellar apprehension. NV intact distally.    Assessment & Plan:  1. 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  Fourth supartz given.  Encouraged her to go ahead with orthopedic referral at this point to discuss arthroscopy and knee replacement.  We discussed typically with underlying arthritis arthroscopy would likely only help for a short  period.  Will refer to ortho.  She will plan to f/u in 1 week for fifth supartz injection.  Knee brace, icing.    After informed written consent, patient was seated on exam table. Right knee was prepped with alcohol swab and utilizing anteromedial approach, patient's right knee was injected intraarticularly with 49m bupivicaine followed by supartz.  Patient tolerated the procedure well without immediate complications.

## 2017-03-18 NOTE — Assessment & Plan Note (Signed)
2/2 arthritis and lateral meniscus tear.  Fourth supartz given.  Encouraged her to go ahead with orthopedic referral at this point to discuss arthroscopy and knee replacement.  We discussed typically with underlying arthritis arthroscopy would likely only help for a short period.  Will refer to ortho.  She will plan to f/u in 1 week for fifth supartz injection.  Knee brace, icing.    After informed written consent, patient was seated on exam table. Right knee was prepped with alcohol swab and utilizing anteromedial approach, patient's right knee was injected intraarticularly with 17mL bupivicaine followed by supartz.  Patient tolerated the procedure well without immediate complications.

## 2017-03-18 NOTE — Assessment & Plan Note (Signed)
2/2 arthritis and lateral meniscus tear.  Fourth supartz given.  Encouraged her to go ahead with orthopedic referral at this point to discuss arthroscopy and knee replacement.  We discussed typically with underlying arthritis arthroscopy would likely only help for a short period.  Will refer to ortho.  She will plan to f/u in 1 week for fifth supartz injection.  Knee brace, icing.    After informed written consent, patient was seated on exam table. Right knee was prepped with alcohol swab and utilizing anteromedial approach, patient's right knee was injected intraarticularly with 15mL bupivicaine followed by supartz.  Patient tolerated the procedure well without immediate complications.

## 2017-03-22 ENCOUNTER — Telehealth: Payer: Self-pay | Admitting: Family Medicine

## 2017-03-22 NOTE — Telephone Encounter (Signed)
Caller name:Tristin Relation to pt: Pharmacist Call back number: [phone] Direct tel number  Pharmacy: [facility] from [facility]  Reason for call: Pharmacist called wanted to change the rx from KLOR-CON M10 10 MEQ tablet to [place]-con 10 regular (without the M since pharmacist stated pt is having trouble swollen the pill. Please advise.

## 2017-03-22 NOTE — Addendum Note (Signed)
Addended by: Sherrie George F on: 03/22/2017 01:04 PM   Modules accepted: Orders

## 2017-03-23 ENCOUNTER — Encounter: Payer: Self-pay | Admitting: Family Medicine

## 2017-03-23 ENCOUNTER — Ambulatory Visit (INDEPENDENT_AMBULATORY_CARE_PROVIDER_SITE_OTHER): Payer: Medicare Other | Admitting: Family Medicine

## 2017-03-23 VITALS — BP 117/69 | HR 84 | Ht 66.0 in | Wt 150.0 lb

## 2017-03-23 DIAGNOSIS — M1711 Unilateral primary osteoarthritis, right knee: Secondary | ICD-10-CM

## 2017-03-23 MED ORDER — POTASSIUM CHLORIDE ER 10 MEQ PO TBCR: EXTENDED_RELEASE_TABLET | ORAL | 0 refills | Status: DC

## 2017-03-23 MED ORDER — SODIUM HYALURONATE (VISCOSUP) 25 MG/2.5ML IX SOSY
2.5000 mL | PREFILLED_SYRINGE | Freq: Once | INTRA_ARTICULAR | Status: AC
  Administered 2017-03-23: 2.5 mL via INTRA_ARTICULAR

## 2017-03-23 NOTE — Progress Notes (Signed)
PCP: Ann Held, DO  Subjective:   HPI: Patient is a 76 y.o. female here for right knee pain.  6/30: Patient reports on 6/2 she was at a wedding coming down some steps when her right knee buckled and she accidentally inverted her left ankle. Immediate pain, inability to bear weight. Had a lot of swelling. Went to ED same day - radiographs negative for fracture of ankle.  Radiographs of right knee with tiny loose body likely from arthropathy but no acute fracture - I am unable to pull up the images. 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 Written for physical therapy but hasn't started this and would like to go downstairs as more convenient for her. She tore right ACL in 1999 but did not have any surgical intervention for this. Occasionally gives way on her. No catching, locking. Using crutches because of ankle. Still swelling but has improved. Pain is 2/10 at rest, up to 7/10 with ambulation, sharp. No skin changes, numbness.  7/14: Patient reports she feels completely better. Did a week of physical therapy. Wearing ASO. Doing home exercises. Right knee still feels a little weak. Pain level 0/10. No skin changes, numbness.  12/03/16: Patient returns with right knee issues. She had been overall doing well. But states over past few weeks she's had worse problems going from sitting to standing. Pain is 0/10 currently, can be sharp laterally. Concerned because she is going on a trip to [place] in May. Feels like knee gives out on her at times. No locking or catching. No skin changes, numbness.  2/28: Patient returns for right knee injection. Pain level 2/10 currently, up to 4/10.  3/28: Patient reports pain is 0/10, up to 5/10 at times. Doing water aerobics. Cortisone helped a little bit. Some slight swelling.  4/4: Patient reports pain level is 3/10 at worst. Injection helped a lot up until today. No skin changes,  numbness.  4/9: Patient reports pain is improved - 0/10 level now. No skin changes, numbness.  5/4: Patient reports she is doing well - pain still 3/10 and leaves for her trip to [place] this weekend. No skin changes, numbness.  5/22: Patient struggled on her trip with knee pain. Was a lot of walking and tour guide didn't stop to give breaks when needed. Pain level is 5/10. No skin changes, numbness.  5/30: Patient returns for fifth supartz injection. Pain level down to 2/10. Has appointment to see Dr. Ninfa Linden in about a week.  Past Medical History:  Diagnosis Date  . Anxiety   . Chicken pox   . Depression   . 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 GERD (gastroesophageal reflux disease)   . Heart murmur   . HTN (hypertension)   . Migraine   . Seasonal allergies     Current Outpatient Prescriptions on File Prior to Visit  Medication Sig Dispense Refill  . ALPRAZolam (XANAX) 0.5 MG tablet Take 1 tablet (0.5 mg total) by mouth 3 (three) times daily. 90 tablet 2  . ARIPiprazole (ABILIFY) 2 MG tablet Take 1 tablet (2 mg total) by mouth daily. (Patient not taking: Reported on 12/14/2016) 90 tablet 1  . atorvastatin (LIPITOR) 40 MG tablet Take 1 tablet (40 mg total) by mouth daily. 90 tablet 1  . ciprofloxacin (CIPRO) 250 MG tablet Take 1 tablet (250 mg total) by mouth 2 (two) times daily. (Patient not taking: Reported on 12/14/2016)  6 tablet 0  . cyclobenzaprine (FLEXERIL) 10 MG tablet Take 1 tablet (10 mg total) by mouth 3 (three) times daily as needed for muscle spasms. (Patient not taking: Reported on 12/14/2016) 30 tablet 0  . desvenlafaxine (PRISTIQ) 100 MG 24 hr tablet Take 1 tablet (100 mg total) by mouth daily. 90 tablet 1  . DILANTIN 100 MG ER capsule TAKE THREE CAPSULES BY MOUTH ONE TIME DAILY 270 capsule 1  . hydrochlorothiazide (HYDRODIURIL) 50 MG tablet TAKE 1 TABLET(50 MG) BY MOUTH DAILY 90 tablet 2  . lisinopril (PRINIVIL,ZESTRIL) 10 MG  tablet TAKE ONE TABLET BY MOUTH ONE TIME DAILY 90 tablet 0  . potassium chloride (KLOR-CON 10) 10 MEQ tablet Take 2 tablets every morning and 2 tablets every evening. 360 tablet 0  . PREMARIN 1.25 MG tablet TAKE ONE TABLET BY MOUTH ONE TIME DAILY 90 tablet 0  . Vilazodone HCl (VIIBRYD STARTER PACK) 10 & 20 MG KIT Take 10 mg by mouth daily. (Patient not taking: Reported on 12/14/2016) 1 kit 0   No current facility-administered medications on file prior to visit.     Past Surgical History:  Procedure Laterality Date  . ABDOMINAL HYSTERECTOMY    . APPENDECTOMY     with hysterectomy  . CATARACT EXTRACTION Bilateral   . LYMPHADENECTOMY     Benign, age 48  . TONSILLECTOMY AND ADENOIDECTOMY      Allergies  Allergen Reactions  . Erythromycin   . Codeine Rash    Makes the patient walk into walls    Social History   Social History  . Marital status: Widowed    Spouse name: N/A  . Number of children: 3  . Years of education: N/A   Occupational History  . Retired     retired   Social History Main Topics  . Smoking status: Never Smoker  . Smokeless tobacco: Never Used  . Alcohol use 0.0 oz/week     Comment: Occ  . Drug use: No  . Sexual activity: Not on file   Other Topics Concern  . Not on file   Social History Narrative  . No narrative on file    Family History  Problem Relation Age of Onset  . Alcoholism Father   . Lung cancer Father        Smoker  . Stroke Father   . High blood pressure Father   . Breast cancer Maternal Grandmother   . Heart disease Mother   . High blood pressure Mother   . Depression Mother   . Seizures Mother   . Colon cancer Neg Hx   . Esophageal cancer Neg Hx   . Stomach cancer Neg Hx   . Rectal cancer Neg Hx     BP 117/69   Pulse 84   Ht 5' 6"  (1.676 m)   Wt 150 lb (68 kg)   BMI 24.21 kg/m   Review of Systems: See HPI above.    Objective:  Physical Exam:  Gen: NAD, comfortable in exam room  Exam not repeated  today. Right knee: No gross deformity, ecchymoses, effusion. TTP lateral joint line.  No other tenderness. FROM. 1+ ant drawer, lachmanns.  Negative post drawer.  Negative valgus/varus testing. Positive mcmurrays, apleys, sit home, thessalys.  Negative patellar apprehension. NV intact distally.    Assessment & Plan:  1. 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  Fifth supartz given.  Has appointment with Dr. Ninfa Linden to discuss arthroscopy and knee replacement.  Knee brace, icing.  F/u prn.  After informed written consent, patient was seated on exam table. Right knee was prepped with alcohol swab and utilizing anteromedial approach, patient's right knee was injected intraarticularly with 45m bupivicaine followed by supartz.  Patient tolerated the procedure well without immediate complications.

## 2017-03-23 NOTE — Telephone Encounter (Signed)
New rx sent in

## 2017-03-23 NOTE — Assessment & Plan Note (Signed)
2/2 arthritis and lateral meniscus tear.  Fifth supartz given.  Has appointment with Dr. Ninfa Linden to discuss arthroscopy and knee replacement.  Knee brace, icing.  F/u prn.  After informed written consent, patient was seated on exam table. Right knee was prepped with alcohol swab and utilizing anteromedial approach, patient's right knee was injected intraarticularly with 29mL bupivicaine followed by supartz.  Patient tolerated the procedure well without immediate complications.

## 2017-03-29 ENCOUNTER — Ambulatory Visit: Payer: Self-pay | Admitting: Licensed Clinical Social Worker

## 2017-03-30 ENCOUNTER — Ambulatory Visit (INDEPENDENT_AMBULATORY_CARE_PROVIDER_SITE_OTHER): Payer: Medicare Other | Admitting: Licensed Clinical Social Worker

## 2017-03-30 ENCOUNTER — Ambulatory Visit (INDEPENDENT_AMBULATORY_CARE_PROVIDER_SITE_OTHER): Payer: Self-pay | Admitting: Orthopaedic Surgery

## 2017-03-30 ENCOUNTER — Ambulatory Visit (INDEPENDENT_AMBULATORY_CARE_PROVIDER_SITE_OTHER): Payer: Medicare Other | Admitting: Orthopaedic Surgery

## 2017-03-30 DIAGNOSIS — S83271A Complex tear of lateral meniscus, current injury, right knee, initial encounter: Secondary | ICD-10-CM | POA: Diagnosis not present

## 2017-03-30 DIAGNOSIS — F3341 Major depressive disorder, recurrent, in partial remission: Secondary | ICD-10-CM

## 2017-03-30 NOTE — Progress Notes (Signed)
Office Visit Note   Patient: Allison Bass           Date of Birth: 10-15-1941           MRN: [id number] Visit Date: 03/30/2017              Requested by: [address] [place], [place] [address] [place], [place] [address] PCP: Carollee Herter, Alferd Apa, DO   Assessment & Plan: Visit Diagnoses:  1. Complex tear of lateral meniscus of right knee as current injury, initial encounter     Plan: At this point Allison Bass has tried and failed all forms of conservative treatment measures. I do feel that the surgery is warranted for her right knee and certainly there some difficulty in deciding what is the right surgery. She certainly wants to try a minimally invasive surgery such as a knee arthroscopy with could help decrease her mechanical symptoms with a partial lateral meniscectomy. She knows that this does not treat the arthritic aspects of the knee. Certainly some form a chondroplasty on the lateral compartment can be helpful but is mainly the lateral meniscus that we would address. She also understands that if this does not help we would recommend a knee replacement. We talked about both options and she is comfortable proceeding with an arthroscopic intervention. We will work on getting this set up in the near future. I had a long discussion of the risk and benefits of the surgery with her as well as well as what her intraoperative and postoperative course with involved.  Follow-Up Instructions: Return for 1 week post-op.   Orders:  No orders of the defined types were placed in this encounter.  No orders of the defined types were placed in this encounter.     Procedures: No procedures performed   Clinical Data: No additional findings.   Subjective: No chief complaint on file. The patient is a very pleasant 76 year old female sent from Dr. Karlton Lemon sports medicine physician at [facility]. He's been treating her right knee for a lateral meniscal tear  and lateral compartment arthritis. She has had steroid injections in her right knee as well as hyaluronic acid injections. She still reports instability of her knee and she says that she is a fall risk is the result of her knee. At this point she is referred for potential surgical evaluation. There plain films and MRI on the system for me to review. She does have some global tenderness she states in her knee and she's not confident when she ambulates on that knee. She does wear a knee brace. She says that the injections that Dr. Barbaraann Barthel have provided have been helpful. She reports a history of remote anterior cruciate ligament tear of her right knee but no surgery on that right knee before. She says that she went to a wedding last year and S1 her knee started significantly hurting worse.  HPI  Review of Systems She currently denies any headache, chest pain, shortness of breath, fever, chills, nausea, vomiting.  Objective: Vital Signs: There were no vitals taken for this visit.  Physical Exam She is alert and oriented 3 and does not walk with an assistive device. She does have a slight limp. Ortho Exam Examination of her right knee today does not show significant effusion. There is no malalignment on clinical exam in terms of significant valgus or varus. She does have both medial lateral joint line tenderness and some slight patellofemoral  crepitation. She does have a positive Murray sign to the lateral side of her knee. Her Lockman's is negative. Specialty Comments:  No specialty comments available.  Imaging: No results found. Both her plain films and MRI were independently reviewed. These were of her right knee. On plain film she does not have any significant malalignment of her knee and no gross findings on plain film. The joint space appears well maintained. The MRI is reviewed and is significant for the fact that she has got a significant posterior horn and mid body lateral meniscal tear that  appears mainly degenerative in nature but there is some acute aspects of his well. She has some cystic changes subchondrally as well as some areas of cartilage deficit in the lateral compartment of her knee. There is thinning of the cartilage in the patellofemoral joint and some medial.  PMFS History: Patient Active Problem List   Diagnosis Date Noted  . Arthritis of right knee 01/27/2017  . Localization-related idiopathic epilepsy and epileptic syndromes with seizures of localized onset, not intractable, without status epilepticus ([place]) 09/09/2016  . Injury of left ankle and foot 07/13/2016  . 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 Essential hypertension 05/17/2016  . Right knee pain 04/23/2016  . Ankle sprain 04/01/2016  . Low back pain 01/27/2016  . Faintness 01/27/2016  . Transient alteration of awareness 01/27/2016  . Poison ivy dermatitis 03/11/2015  . 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 Epilepsy ([place]) 02/24/2015   Past Medical History:  Diagnosis Date  . Anxiety   . Chicken pox   . Depression   . 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 GERD (gastroesophageal reflux disease)   . Heart murmur   . HTN (hypertension)   . Migraine   . Seasonal allergies     Family History  Problem Relation Age of Onset  . Alcoholism Father   . Lung cancer Father        Smoker  . Stroke Father   . High blood pressure Father   . Breast cancer Maternal Grandmother   . Heart disease Mother   . High blood pressure Mother   . Depression Mother   . Seizures Mother   . Colon cancer Neg Hx   . Esophageal cancer Neg Hx   . Stomach cancer Neg Hx   . Rectal cancer Neg Hx     Past Surgical History:  Procedure Laterality Date  . ABDOMINAL HYSTERECTOMY    . APPENDECTOMY     with hysterectomy  . CATARACT EXTRACTION Bilateral   . LYMPHADENECTOMY     Benign, age 22  . TONSILLECTOMY AND ADENOIDECTOMY     Social History    Occupational History  . Retired     retired   Social History Main Topics  . Smoking status: Never Smoker  . Smokeless tobacco: Never Used  . Alcohol use 0.0 oz/week     Comment: Occ  . Drug use: No  . Sexual activity: Not on file

## 2017-03-31 ENCOUNTER — Ambulatory Visit (INDEPENDENT_AMBULATORY_CARE_PROVIDER_SITE_OTHER): Payer: Self-pay | Admitting: Orthopaedic Surgery

## 2017-04-11 DIAGNOSIS — L603 Nail dystrophy: Secondary | ICD-10-CM | POA: Diagnosis not present

## 2017-04-11 DIAGNOSIS — B351 Tinea unguium: Secondary | ICD-10-CM | POA: Diagnosis not present

## 2017-04-12 ENCOUNTER — Other Ambulatory Visit: Payer: Self-pay | Admitting: Family Medicine

## 2017-04-12 ENCOUNTER — Ambulatory Visit (INDEPENDENT_AMBULATORY_CARE_PROVIDER_SITE_OTHER): Payer: Medicare Other | Admitting: Podiatry

## 2017-04-12 DIAGNOSIS — B351 Tinea unguium: Secondary | ICD-10-CM

## 2017-04-12 DIAGNOSIS — L600 Ingrowing nail: Secondary | ICD-10-CM | POA: Insufficient documentation

## 2017-04-12 MED ORDER — ALPRAZOLAM 0.5 MG PO TABS: 0.5000 mg | ORAL_TABLET | Freq: Three times a day (TID) | ORAL | 0 refills | Status: DC

## 2017-04-12 MED ORDER — CEPHALEXIN 500 MG PO CAPS: 500.0000 mg | ORAL_CAPSULE | Freq: Three times a day (TID) | ORAL | 0 refills | Status: DC

## 2017-04-12 NOTE — Telephone Encounter (Signed)
database 

## 2017-04-12 NOTE — Telephone Encounter (Signed)
rx called into [facility], [facility].

## 2017-04-12 NOTE — Telephone Encounter (Signed)
Requesting:   alprazolam Contract  10/13/2016 UDS   High risk on 11/13/2016 Last OV   04/20/2016 Last Refill    #90 with 2 refills on 01/13/2017  Please Advise

## 2017-04-12 NOTE — Patient Instructions (Signed)

## 2017-04-12 NOTE — Telephone Encounter (Signed)
Database ran

## 2017-04-13 ENCOUNTER — Ambulatory Visit: Payer: Medicare Other | Admitting: Licensed Clinical Social Worker

## 2017-04-14 DIAGNOSIS — M23221 Derangement of posterior horn of medial meniscus due to old tear or injury, right knee: Secondary | ICD-10-CM | POA: Diagnosis not present

## 2017-04-14 DIAGNOSIS — G8918 Other acute postprocedural pain: Secondary | ICD-10-CM | POA: Diagnosis not present

## 2017-04-14 DIAGNOSIS — S83271A Complex tear of lateral meniscus, current injury, right knee, initial encounter: Secondary | ICD-10-CM | POA: Diagnosis not present

## 2017-04-14 DIAGNOSIS — M94261 Chondromalacia, right knee: Secondary | ICD-10-CM | POA: Diagnosis not present

## 2017-04-15 NOTE — Progress Notes (Signed)
Subjective:    Patient ID: Allison Bass, female   DOB: 76 y.o.   MRN: [id number]   HPI 76 year old female presents the also concerns of a left ingrown toenails of the toe which has been ongoing since April 2018. She states that it hurts with pressure in shoes. She feels that there is something sharp sticking her toe. She's tried cutting the area or any significant improvement. She denies any drainage or pus. She has no other complaints today.   Review of Systems  All other systems reviewed and are negative.       Objective:  Physical Exam General: AAO x3, NAD  Dermatological: The left hallux toenail in general appears to be dystrophic, discolored yellow-brown discoloration as well as the other toenails are discolored. However on the left hallux there is incurvation along both the medial and lateral aspects of the nail corners and there is tenderness palpation along this area. There is localized edema there is no erythema or increase in warmth. There is no ascending synovitis. There is no drainage or pus expressed. No open lesions identified otherwise.   Vascular: DP/PT pulses 2/4, CRT less than 3 seconds. There is no pain with calf compression, swelling, warmth, erythema.   Neruologic: Grossly intact via light touch bilateral. Vibratory intact via tuning fork bilateral. Protective threshold with Semmes Wienstein monofilament intact to all pedal sites bilateral.   Musculoskeletal: No gross boney pedal deformities bilateral. No pain, crepitus, or limitation noted with foot and ankle range of motion bilateral. Muscular strength 5/5 in all groups tested bilateral.  Gait: Unassisted, Nonantalgic.      Assessment:      76 year old female left hallux ingrown toenail    Plan:     -Treatment options discussed including all alternatives, risks, and complications -Etiology of symptoms were discussed -At this time, the patient is requesting partial nail removal with chemical  matricectomy to the symptomatic portion of the nail. Risks and complications were discussed with the patient for which they understand and  verbally consent to the procedure. Under sterile conditions a total of 3 mL of a mixture of 2% lidocaine plain and 0.5% Marcaine plain was infiltrated in a hallux block fashion. Once anesthetized, the skin was prepped in sterile fashion. A tourniquet was then applied. Next the medial and lateral aspect of hallux nail border was then sharply excised making sure to remove the entire offending nail border. Once the nails were ensured to be removed area was debrided and the underlying skin was intact. There is no purulence identified in the procedure. Next phenol was then applied under standard conditions and copiously irrigated. Silvadene was applied. A dry sterile dressing was applied. After application of the dressing the tourniquet was removed and there is found to be an immediate capillary refill time to the digit. The patient tolerated the procedure well any complications. Post procedure instructions were discussed the patient for which he verbally understood. Follow-up in one week for nail check or sooner if any problems are to arise. Discussed signs/symptoms of infection and directed to call the office immediately should any occur or go directly to the emergency room. In the meantime, encouraged to call the office with any questions, concerns, changes symptoms. -Keflex -Nail sent for culture.   Celesta Gentile, DPM

## 2017-04-19 ENCOUNTER — Encounter: Payer: Self-pay | Admitting: Podiatry

## 2017-04-19 ENCOUNTER — Ambulatory Visit (INDEPENDENT_AMBULATORY_CARE_PROVIDER_SITE_OTHER): Payer: Medicare Other | Admitting: Podiatry

## 2017-04-19 DIAGNOSIS — L600 Ingrowing nail: Secondary | ICD-10-CM

## 2017-04-19 NOTE — Patient Instructions (Signed)

## 2017-04-19 NOTE — Progress Notes (Signed)
Subjective: Allison Bass is a 76 y.o.  female returns to office today for follow up evaluation after having left Hallux partial  nail avulsion performed. Patient has been soaking using epsom salts and applying topical antibiotic covered with bandaid daily. She has not had any drainage or pus and the pain is improved. Patient denies fevers, chills, nausea, vomiting. Denies any calf pain, chest pain, SOB.   Objective:  Vitals: Reviewed  General: Well developed, nourished, in no acute distress, alert and oriented x3   Dermatology: Skin is warm, dry and supple bilateral. Left hallux nail borders appears to be clean, dry, with mild granular tissue and surrounding scab. There is no surrounding erythema, edema, drainage/purulence. The remaining nails appear unremarkable at this time. There are no other lesions or other signs of infection present.  Neurovascular status: Intact. No lower extremity swelling; No pain with calf compression bilateral.  Musculoskeletal: Decreased tenderness to palpation of the left hallux nail bed. Muscular strength within normal limits bilateral.   Assesement and Plan: S/p partial nail avulsion, doing well.   -Continue soaking in epsom salts twice a day followed by antibiotic ointment and a band-aid. Can leave uncovered at night. Continue this until completely healed.  -If the area has not healed in 2 weeks, call the office for follow-up appointment, or sooner if any problems arise.  -Monitor for any signs/symptoms of infection. Call the office immediately if any occur or go directly to the emergency room. Call with any questions/concerns. -Patient had questions regarding her knee surgery on the right side. Discussed she needs to talk to Dr. Trevor Mace office about this. She was not having any major concerns, it was about exercises she is to be doing. I did not advise her on this.   Celesta Gentile, DPM

## 2017-04-21 ENCOUNTER — Telehealth: Payer: Self-pay | Admitting: *Deleted

## 2017-04-21 ENCOUNTER — Inpatient Hospital Stay (INDEPENDENT_AMBULATORY_CARE_PROVIDER_SITE_OTHER): Payer: Medicare Other | Admitting: Orthopaedic Surgery

## 2017-04-21 MED ORDER — NONFORMULARY OR COMPOUNDED ITEM: 2 refills | Status: DC

## 2017-04-21 NOTE — Telephone Encounter (Addendum)
-----   Message from Trula Slade, DPM sent at 04/21/2017  4:57 PM EDT ----- Yes. Thanks.  ----- Message ----- From: Andres Ege, RN Sent: 04/21/2017   4:50 PM To: Trula Slade, DPM  Dr Jacqualyn Posey, before I call pt do you want her to come in and discuss the oral if she wants to use? Marcy Siren ----- Message ----- From: Trula Slade, DPM Sent: 04/21/2017   8:17 AM To: Andres Ege, RN  + fungus- please let her know. We can do oral but if she wants to do topical please order through [employer]. She can start topical treatment on the nail once the scab is formed from the procedure. Pt states she would like to use the oral and topical. I will have schedulers contact pt tomorrow to schedule and will fax to [facility].

## 2017-04-25 ENCOUNTER — Ambulatory Visit (INDEPENDENT_AMBULATORY_CARE_PROVIDER_SITE_OTHER): Payer: Medicare Other | Admitting: Physician Assistant

## 2017-04-25 DIAGNOSIS — Z9889 Other specified postprocedural states: Secondary | ICD-10-CM

## 2017-04-25 NOTE — Progress Notes (Signed)
Mrs. Benita Gutter returns today follow-up of her right knee status post knee arthroscopy 04/14/2017. She states overall knee is doing well status post soreness in the knee. Taking Advil as needed icing. Doing home exercises. 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 She also had grade 3 changes underneath the patella.  Physical exam general well-developed well-nourished female in no acute distress. Affect appropriate. Right knee sutures well approximated the port sites no signs of infection. Calf supple nontender. She has overall good range of motion the knee.  Plan: Sutures removed. She'll work on Geophysicist/field seismologist. Follow up with [place] in 1 month. Sooner if there is any questions or concerns.

## 2017-04-26 ENCOUNTER — Encounter: Payer: Self-pay | Admitting: Podiatry

## 2017-04-26 ENCOUNTER — Telehealth: Payer: Self-pay | Admitting: *Deleted

## 2017-04-26 ENCOUNTER — Encounter: Payer: Self-pay | Admitting: Neurology

## 2017-04-26 NOTE — Telephone Encounter (Signed)
Called patient and explained the way you are to  put the nail polish on and how long to do it for and to call me if needed or the office. Allison Bass

## 2017-04-26 NOTE — Telephone Encounter (Signed)
Hello, Dr. Jacqualyn Posey had ordered a medication for me to use on my toenail fungus and it arrived today. I am needing the instructions. Should I wait to use it until the toe where he took the sides off heals completely first? How much should I use? I just need all the instructions. If you could, please call me back at [phone]. Thank you.

## 2017-04-26 NOTE — Telephone Encounter (Signed)
Called the patient back and gave instructions on how to apply the polish and patient stated that she was not going to take the lamisil due to the side effects. Allison Bass

## 2017-05-02 ENCOUNTER — Telehealth: Payer: Self-pay | Admitting: Podiatry

## 2017-05-02 ENCOUNTER — Telehealth (INDEPENDENT_AMBULATORY_CARE_PROVIDER_SITE_OTHER): Payer: Self-pay | Admitting: Orthopaedic Surgery

## 2017-05-02 ENCOUNTER — Encounter (INDEPENDENT_AMBULATORY_CARE_PROVIDER_SITE_OTHER): Payer: Self-pay | Admitting: Orthopaedic Surgery

## 2017-05-02 ENCOUNTER — Telehealth: Payer: Self-pay | Admitting: *Deleted

## 2017-05-02 ENCOUNTER — Other Ambulatory Visit: Payer: Self-pay | Admitting: *Deleted

## 2017-05-02 ENCOUNTER — Encounter: Payer: Self-pay | Admitting: Podiatry

## 2017-05-02 MED ORDER — FLUCONAZOLE 150 MG PO TABS: 150.0000 mg | ORAL_TABLET | Freq: Once | ORAL | 0 refills | Status: AC

## 2017-05-02 NOTE — Telephone Encounter (Signed)
Will answer her through [facility]

## 2017-05-02 NOTE — Telephone Encounter (Addendum)
Dr. Jacqualyn Posey ordered Diflucan one tablet take as directed.05/03/2017-DrJacqualyn Posey okayed 2nd diflucan.

## 2017-05-02 NOTE — Telephone Encounter (Signed)
Patient called advised her knee has gotten worse as of yesterday. Patient said she could not go out yesterday. Patient said she kept her knee up all day. Patient has an appointment with Artis Delay Thursday at 3:45pm. The number to contact patient is [phone]

## 2017-05-02 NOTE — Telephone Encounter (Signed)
Yes, I saw Dr. Jacqualyn Posey a few weeks ago and he took care of my ingrown toenail. He put me on an antibiotic which has caused me to have a raging yeast infection. I am wanting him to call in Diflucan which always takes two pills. My Pharmacy is [facility] on [address]. Please call me back. Thank you.

## 2017-05-03 MED ORDER — FLUCONAZOLE 150 MG PO TABS: 150.0000 mg | ORAL_TABLET | Freq: Once | ORAL | 0 refills | Status: AC

## 2017-05-03 NOTE — Addendum Note (Signed)
Addended by: Harriett Sine D on: 05/03/2017 08:29 AM   Modules accepted: Orders

## 2017-05-05 ENCOUNTER — Ambulatory Visit (INDEPENDENT_AMBULATORY_CARE_PROVIDER_SITE_OTHER): Payer: Medicare Other | Admitting: Physician Assistant

## 2017-05-05 ENCOUNTER — Encounter (INDEPENDENT_AMBULATORY_CARE_PROVIDER_SITE_OTHER): Payer: Self-pay | Admitting: Physician Assistant

## 2017-05-05 DIAGNOSIS — M1711 Unilateral primary osteoarthritis, right knee: Secondary | ICD-10-CM

## 2017-05-05 DIAGNOSIS — Z9889 Other specified postprocedural states: Secondary | ICD-10-CM

## 2017-05-05 NOTE — Progress Notes (Signed)
Allison Bass returns today follow-up of her right knee arthroscopy which was performed on 04/14/2017. She states Sunday morning that her knee began hurting a lot. She also noticed swelling in the knee. Joint pain to be 5-6 out of 10 pain. Still having pain in the knee. However she is not having any giving way-like sensation in the knee that she was having prior to surgery. 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 She had maceration of the medial meniscus had a lateral meniscal tear for which she underwent a partial meniscectomy.  Physical exam: Right knee she has full extension flexion. Port sites are healed well no signs of infection. Right calf supple. No gross edema right knee or lower leg.  Plan: Discussed with her quad strengthening exercises again. She states she is unable take Advil or Aleve due to history of stroke. Recommended trying tumeric. See her back in a release scheduled appointment in August. May consider cortisone injection in the still bothered that time.

## 2017-05-09 ENCOUNTER — Ambulatory Visit: Payer: Self-pay | Admitting: Neurology

## 2017-05-18 ENCOUNTER — Telehealth: Payer: Self-pay | Admitting: Family Medicine

## 2017-05-18 NOTE — Telephone Encounter (Signed)
Caller name: Helyn App with [facility] Can be reached: [phone]  Reason for call: called requesting f/u on RX faxed 05/14/17 for refills on alprazolam 0.5mg  TID. Pt last filled 04/13/17.

## 2017-05-19 MED ORDER — ALPRAZOLAM 0.5 MG PO TABS: 0.5000 mg | ORAL_TABLET | Freq: Three times a day (TID) | ORAL | 0 refills | Status: DC

## 2017-05-19 NOTE — Telephone Encounter (Signed)
She also needs ov

## 2017-05-19 NOTE — Telephone Encounter (Signed)
Printed/PCP signed/faxed hardcopy for Alprazolam to [facility]. Called the patient left message to call back to inform her to schedule appointment.

## 2017-05-19 NOTE — Telephone Encounter (Signed)
Refill x1   1 refill

## 2017-05-19 NOTE — Telephone Encounter (Signed)
Requesting:  alprazolam Contract       10/13/2016 UDS   Low risk next is due on 06/15/2017 Last OV   04/20/2016 Last Refill   #90 on 04/12/2017  Please Advise

## 2017-05-20 ENCOUNTER — Encounter: Payer: Self-pay | Admitting: Family Medicine

## 2017-05-20 NOTE — Telephone Encounter (Signed)
Allison Bass-- would you call this pt ?  Pt need annual visits at least and with controlled substances more often  If she gets angry with you after explaining our office protochol---- I will have to d/c her

## 2017-05-20 NOTE — Telephone Encounter (Signed)
FYI to Dr. Etter Sjogren Telephoned this patient that she needs an appointment to continue getting refills--her last appt with you was 03/2016 and no future appointment scheduled at this time. She got very angry, felt an appointment not necessary and should not need to be seen any more than once every 2 years.  She stated she sees her therapist and that should be enough.  I offered to scheduled her an appointment while on the phone and she refused to do so.

## 2017-05-20 NOTE — Telephone Encounter (Signed)
Called and discussed with patient.  She was still very upset and feels that it is ridiculous that she has be seen.  She stated that she likes Dr. Etter Sjogren, and would really like to continue to be seeing her, but she feels she's being harassed and that we just want her business.  She sees her neurologist and her psychologist and she just doesn't understand why she has to come in to be see [place] on an annual basis.  She also brought up the fact that she is still very offended for being accused of selling her medications.  Pt states she takes her medication (xanax) three times a day and she doesn't know why it's not showing up in her urine.  "Maybe the dose is not high enough."  She does not feel she should have to supply a urine sample every time she comes in.  Pt states she's very angry and being off of her medications does not help.  Pt was again advised that an annual visit is required and that she's is welcome to come in and discuss with Dr. Etter Sjogren her concerns about an annual visit as well as her concerns regarding the urine drug screens.  She agreed to come in on Tuesday at 3:30pm to discuss her concerns.

## 2017-05-20 NOTE — Telephone Encounter (Signed)
Telephoned the prescription in as pharmacy did not get fax.

## 2017-05-24 ENCOUNTER — Encounter: Payer: Self-pay | Admitting: Family Medicine

## 2017-05-24 ENCOUNTER — Ambulatory Visit (INDEPENDENT_AMBULATORY_CARE_PROVIDER_SITE_OTHER): Payer: Medicare Other | Admitting: Family Medicine

## 2017-05-24 VITALS — BP 128/70 | HR 87 | Temp 97.9°F | Ht 66.0 in | Wt 158.4 lb

## 2017-05-24 DIAGNOSIS — Z780 Asymptomatic menopausal state: Secondary | ICD-10-CM | POA: Diagnosis not present

## 2017-05-24 DIAGNOSIS — G40909 Epilepsy, unspecified, not intractable, without status epilepticus: Secondary | ICD-10-CM

## 2017-05-24 DIAGNOSIS — F419 Anxiety disorder, unspecified: Secondary | ICD-10-CM

## 2017-05-24 DIAGNOSIS — F320 Major depressive disorder, single episode, mild: Secondary | ICD-10-CM

## 2017-05-24 DIAGNOSIS — E785 Hyperlipidemia, unspecified: Secondary | ICD-10-CM | POA: Diagnosis not present

## 2017-05-24 DIAGNOSIS — I10 Essential (primary) hypertension: Secondary | ICD-10-CM | POA: Diagnosis not present

## 2017-05-24 MED ORDER — DILANTIN 100 MG PO CAPS: ORAL_CAPSULE | ORAL | 1 refills | Status: DC

## 2017-05-24 MED ORDER — ALPRAZOLAM 0.5 MG PO TABS: 0.5000 mg | ORAL_TABLET | Freq: Three times a day (TID) | ORAL | 2 refills | Status: DC

## 2017-05-24 MED ORDER — POTASSIUM CHLORIDE ER 10 MEQ PO TBCR: EXTENDED_RELEASE_TABLET | ORAL | 1 refills | Status: DC

## 2017-05-24 MED ORDER — ARIPIPRAZOLE 2 MG PO TABS: 2.0000 mg | ORAL_TABLET | Freq: Every day | ORAL | 3 refills | Status: DC

## 2017-05-24 MED ORDER — LISINOPRIL 10 MG PO TABS: 10.0000 mg | ORAL_TABLET | Freq: Every day | ORAL | 1 refills | Status: DC

## 2017-05-24 MED ORDER — HYDROCHLOROTHIAZIDE 50 MG PO TABS
ORAL_TABLET | ORAL | 2 refills | Status: DC
Start: 2017-05-24 — End: 2018-06-14

## 2017-05-24 MED ORDER — ATORVASTATIN CALCIUM 40 MG PO TABS: 40.0000 mg | ORAL_TABLET | Freq: Every day | ORAL | 1 refills | Status: DC

## 2017-05-24 MED ORDER — DESVENLAFAXINE SUCCINATE ER 100 MG PO TB24: 100.0000 mg | ORAL_TABLET | Freq: Every day | ORAL | 3 refills | Status: DC

## 2017-05-24 MED ORDER — ESTROGENS CONJUGATED 1.25 MG PO TABS: 1.2500 mg | ORAL_TABLET | Freq: Every day | ORAL | 0 refills | Status: DC

## 2017-05-24 NOTE — Patient Instructions (Signed)

## 2017-05-24 NOTE — Progress Notes (Signed)
Patient ID: Allison Bass, female    DOB: August 11, 1941  Age: 76 y.o. MRN: [id number]    Subjective:  Subjective  HP Allison Bass presents to discuss the issues she is having with her meds.  She states the office accused her of selling her xanax---  Staff members called her to let her know her uds was neg for xanax and they asked her how she was taking it.  She interpreted that as being accused of selling it. Her next uds came back + for barbituates and pcp -- she was called to ask about that and she got angry.   She feels she does not need to be seen but every 2 years --- I explained if we are writing her bp , chol and xanax etc -- she needs to be seen at least 2x a year.  She can have her specialists start writing her meds and do her labs    Review of Systems  Constitutional: Negative for activity change, appetite change, fatigue and unexpected weight change.  Respiratory: Negative for cough and shortness of breath.   Cardiovascular: Negative for chest pain and palpitations.  Psychiatric/Behavioral: Positive for agitation. Negative for dysphoric mood. The patient is not nervous/anxious.     History Past Medical History:  Diagnosis Date  . Anxiety   . Chicken pox   . Depression   . 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 GERD (gastroesophageal reflux disease)   . Heart murmur   . HTN (hypertension)   . Migraine   . Seasonal allergies     She has a past surgical history that includes Abdominal hysterectomy; Tonsillectomy and adenoidectomy; Cataract extraction (Bilateral); Lymphadenectomy; and Appendectomy.   Her family history includes Alcoholism in her father; Breast cancer in her maternal grandmother; Depression in her mother; Heart disease in her mother; High blood pressure in her father and mother; Lung cancer in her father; Seizures in her mother; Stroke in her father.She reports that she has never smoked. She has never used smokeless tobacco.  She reports that she drinks alcohol. She reports that she does not use drugs.  Current Outpatient Prescriptions on File Prior to Visit  Medication Sig Dispense Refill  . cyclobenzaprine (FLEXERIL) 10 MG tablet Take 1 tablet (10 mg total) by mouth 3 (three) times daily as needed for muscle spasms. 30 tablet 0  . NONFORMULARY OR COMPOUNDED [place]:  Onychomycosis nail lacquer - Fluconazole 2%, Terbinafine 1%, DMSO appt to affected area daily. 120 each 2   No current facility-administered medications on file prior to visit.      Objective:  Objective  Physical Exam  Constitutional: She is oriented to person, place, and time. She appears well-developed and well-nourished.  HENT:  Head: Normocephalic and atraumatic.  Eyes: Conjunctivae and EOM are normal.  Neck: Normal range of motion. Neck supple. No JVD present. Carotid bruit is not present. No thyromegaly present.  Cardiovascular: Normal rate, regular rhythm and normal heart sounds.   No murmur heard. Pulmonary/Chest: Effort normal and breath sounds normal. No respiratory distress. She has no wheezes. She has no rales. She exhibits no tenderness.  Musculoskeletal: She exhibits no edema.  Neurological: She is alert and oriented to person, place, and time.  Nursing note and vitals reviewed.  BP 128/70 (BP Location: Left Arm, Patient Position: Sitting, Cuff Size: Normal)   Pulse 87   Temp 97.9 F (36.6 C) (Oral)   Ht 5\' 6"  (1.676 m)  Wt 158 lb 6 oz (71.8 kg)   SpO2 98%   BMI 25.56 kg/m  Wt Readings from Last 3 Encounters:  05/24/17 158 lb 6 oz (71.8 kg)  03/23/17 150 lb (68 kg)  03/15/17 154 lb (69.9 kg)     Lab Results  Component Value Date   WBC 6.5 01/15/2016   HGB 12.9 01/15/2016   HCT 38.3 01/15/2016   PLT 271.0 01/15/2016   GLUCOSE 120 (H) 01/15/2016   CHOL 216 (H) 03/05/2016   TRIG 93.0 03/05/2016   HDL 85.40 03/05/2016   LDLCALC 112 (H) 03/05/2016   ALT 8 01/15/2016   AST 22 01/15/2016   NA 130  (L) 01/15/2016   K 3.5 01/15/2016   CL 92 (L) 01/15/2016   CREATININE 0.86 01/15/2016   BUN 13 01/15/2016   CO2 30 01/15/2016   TSH 2.27 01/15/2016   HGBA1C 5.6 03/05/2016    Mr Knee Right Wo Contrast  Result Date: 12/19/2016 CLINICAL DATA:  Lateral right knee pain and popping for 1 year. The patient's right knee gives way. No known injury. EXAM: MRI OF THE RIGHT KNEE WITHOUT CONTRAST TECHNIQUE: Multiplanar, multisequence MR imaging of the knee was performed. No intravenous contrast was administered. COMPARISON:  Plain films right knee 03/26/2016. FINDINGS: MENISCI Medial meniscus: Intrasubstance degenerative signal is seen in the posterior horn. No tear. Lateral meniscus: The lateral meniscus is markedly degenerated throughout. Horizontal tear in the posterior horn reaches the meniscal undersurface. The tear extends peripherally into the posterior body of the meniscus. The body is severely degenerated and diminutive. LIGAMENTS Cruciates:  Intact. Collaterals:  Intact. CARTILAGE Patellofemoral: Degeneration about the patella is worst at the apex and along the lateral facet. There is a full-thickness defect at the apex in the midpole. Small focus of subchondral edema is seen in the inferior pole in the lateral facet. Medial:  Mildly degenerated. Lateral:  Extensive cartilage loss is present throughout. Joint:  Small effusion. Popliteal Fossa:  No Baker's cyst. Extensor Mechanism:  Intact. Bones: No fracture or worrisome lesion. Subchondral cyst formation and mild edema are seen in the lateral tibial plateau. Subchondral cyst in the medial tibial eminence is noted. A 0.3 cm well-marginated T2 hyperintense lesion in the lateral femoral condyle is likely a benign cyst or enchondroma. Other: None. IMPRESSION: Dominant finding is osteoarthritis which is worst in the lateral compartment. Associated horizontal tear in the posterior horn and posterior body of the lateral meniscus is identified. The body of the  lateral meniscus is markedly degenerated. Electronically Signed   By: Inge Rise M.D.   On: 12/19/2016 09:50     Assessment & Plan:  Plan  I have discontinued [name]'s Vilazodone HCl, ciprofloxacin, KLOR-CON M10, and cephALEXin. I have changed her PREMARIN to estrogens (conjugated). I have also changed her lisinopril. Additionally, I am having her maintain her cyclobenzaprine, NONFORMULARY OR COMPOUNDED ITEM, ALPRAZolam, ARIPiprazole, atorvastatin, desvenlafaxine, hydrochlorothiazide, potassium chloride, and DILANTIN.  Meds ordered this encounter  Medications  . ALPRAZolam (XANAX) 0.5 MG tablet    Sig: Take 1 tablet (0.5 mg total) by mouth 3 (three) times daily.    Dispense:  90 tablet    Refill:  2  . ARIPiprazole (ABILIFY) 2 MG tablet    Sig: Take 1 tablet (2 mg total) by mouth daily.    Dispense:  90 tablet    Refill:  3  . atorvastatin (LIPITOR) 40 MG tablet    Sig: Take 1 tablet (40 mg total) by mouth daily.  Dispense:  90 tablet    Refill:  1  . desvenlafaxine (PRISTIQ) 100 MG 24 hr tablet    Sig: Take 1 tablet (100 mg total) by mouth daily.    Dispense:  90 tablet    Refill:  3  . hydrochlorothiazide (HYDRODIURIL) 50 MG tablet    Sig: TAKE 1 TABLET(50 MG) BY MOUTH DAILY    Dispense:  90 tablet    Refill:  2  . lisinopril (PRINIVIL,ZESTRIL) 10 MG tablet    Sig: Take 1 tablet (10 mg total) by mouth daily.    Dispense:  90 tablet    Refill:  1  . potassium chloride (KLOR-CON 10) 10 MEQ tablet    Sig: Take 2 tablets every morning and 2 tablets every evening.    Dispense:  360 tablet    Refill:  1  . estrogens, conjugated, (PREMARIN) 1.25 MG tablet    Sig: Take 1 tablet (1.25 mg total) by mouth daily.    Dispense:  90 tablet    Refill:  0  . DILANTIN 100 MG ER capsule    Sig: TAKE THREE CAPSULES BY MOUTH ONE TIME DAILY    Dispense:  270 capsule    Refill:  1    Problem List Items Addressed This Visit      Unprioritized   Epilepsy ([place])   Relevant  Medications   DILANTIN 100 MG ER capsule   Essential hypertension   Relevant Medications   atorvastatin (LIPITOR) 40 MG tablet   hydrochlorothiazide (HYDRODIURIL) 50 MG tablet   lisinopril (PRINIVIL,ZESTRIL) 10 MG tablet   potassium chloride (KLOR-CON 10) 10 MEQ tablet    Other Visit Diagnoses    Anxiety    -  Primary   Relevant Medications   ALPRAZolam (XANAX) 0.5 MG tablet   desvenlafaxine (PRISTIQ) 100 MG 24 hr tablet   Menopause       Relevant Medications   estrogens, conjugated, (PREMARIN) 1.25 MG tablet   Depression, major, single episode, mild (HCC)       Relevant Medications   ALPRAZolam (XANAX) 0.5 MG tablet   ARIPiprazole (ABILIFY) 2 MG tablet   desvenlafaxine (PRISTIQ) 100 MG 24 hr tablet   Hyperlipidemia LDL goal <100       Relevant Medications   atorvastatin (LIPITOR) 40 MG tablet   hydrochlorothiazide (HYDRODIURIL) 50 MG tablet   lisinopril (PRINIVIL,ZESTRIL) 10 MG tablet    pt will discuss having her meds written by her specialists  rto prn  Follow-up: Return in about 1 year (around 05/24/2018), or if symptoms worsen or fail to improve.  Ann Held, DO

## 2017-05-24 NOTE — Progress Notes (Signed)
Pre visit review using our clinic review tool, if applicable. No additional management support is needed unless otherwise documented below in the visit note. 

## 2017-05-25 ENCOUNTER — Ambulatory Visit (INDEPENDENT_AMBULATORY_CARE_PROVIDER_SITE_OTHER): Payer: Medicare Other | Admitting: Orthopaedic Surgery

## 2017-05-25 DIAGNOSIS — Z9889 Other specified postprocedural states: Secondary | ICD-10-CM

## 2017-05-25 DIAGNOSIS — M25561 Pain in right knee: Secondary | ICD-10-CM

## 2017-05-25 MED ORDER — LIDOCAINE HCL 1 % IJ SOLN
3.0000 mL | INTRAMUSCULAR | Status: AC | PRN
  Administered 2017-05-25: 3 mL

## 2017-05-25 MED ORDER — METHYLPREDNISOLONE ACETATE 40 MG/ML IJ SUSP
40.0000 mg | INTRAMUSCULAR | Status: AC | PRN
  Administered 2017-05-25: 40 mg via INTRA_ARTICULAR

## 2017-05-25 NOTE — Progress Notes (Signed)
Office Visit Note   Patient: Allison Bass           Date of Birth: Sep 11, 1941           MRN: [id number] Visit Date: 05/25/2017              Requested by: [address], [place], [place] [address] [place], [place] [address] PCP: Carollee Herter, Alferd Apa, DO   Assessment & Plan: Visit Diagnoses:  1. S/P right knee arthroscopy     Plan: Given the pain and discomfort in her knee I recommended a steroid injection today. She was agreeable to this. I do feel she would benefit from quad strengthening exercises and a knee sleeve as well. The knee sleeve can be gotten over-the-counter. All questions were encouraged and answered. She understands that I feel that we are heading toward a knee replacement surgery based on her clinical exam as well as arthroscopic findings.  Follow-Up Instructions: Return in about 3 weeks (around 06/15/2017).   Orders:  No orders of the defined types were placed in this encounter.  No orders of the defined types were placed in this encounter.     Procedures: Large Joint Inj Date/Time: 05/25/2017 2:47 PM Performed by: Mcarthur Rossetti Authorized by: Mcarthur Rossetti   Location:  Knee Site:  R knee Ultrasound Guidance: No   Fluoroscopic Guidance: No   Arthrogram: No   Medications:  3 mL lidocaine 1 %; 40 mg methylPREDNISolone acetate 40 MG/ML     Clinical Data: No additional findings.   Subjective: No chief complaint on file. The patient is well-known to me. She is status post a right knee arthroscopy on 04/14/2017. She's been having a lot of pain in that knee. She's also had swelling and locking in the knee. At the time of surgery we found a macerated lateral meniscus and significant cartilage changes throughout the knee.  HPI  Review of Systems She currently denies any fever, chills, nausea, vomiting  Objective: Vital Signs: There were no vitals taken for this visit.  Physical Exam She is alert and oriented  3 in no acute distress Ortho Exam Examination of her right knee shows painful range of motion of the knee. She has a valgus malalignment and a mild effusion. Specialty Comments:  No specialty comments available.  Imaging: No results found. We went over her arthroscopy pictures in detail to show her the extent of the arthritic changes in her knee.  PMFS History: Patient Active Problem List   Diagnosis Date Noted  . S/P right knee arthroscopy 05/05/2017  . Ingrown toenail 04/12/2017  . Arthritis of right knee 01/27/2017  . Localization-related idiopathic epilepsy and epileptic syndromes with seizures of localized onset, not intractable, without status epilepticus ([place]) 09/09/2016  . Injury of left ankle and foot 07/13/2016  . 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 Essential hypertension 05/17/2016  . Right knee pain 04/23/2016  . Ankle sprain 04/01/2016  . Low back pain 01/27/2016  . Faintness 01/27/2016  . Transient alteration of awareness 01/27/2016  . Poison ivy dermatitis 03/11/2015  . 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 Epilepsy ([place]) 02/24/2015   Past Medical History:  Diagnosis Date  . Anxiety   . Chicken pox   . Depression   . Epilepsy ([place])    controlled by Dilantin- last seizure 1995  . GERD (gastroesophageal reflux disease)   . Heart murmur   . HTN (hypertension)   .  Migraine   . Seasonal allergies     Family History  Problem Relation Age of Onset  . Alcoholism Father   . Lung cancer Father        Smoker  . Stroke Father   . High blood pressure Father   . Breast cancer Maternal Grandmother   . Heart disease Mother   . High blood pressure Mother   . Depression Mother   . Seizures Mother   . Colon cancer Neg Hx   . Esophageal cancer Neg Hx   . Stomach cancer Neg Hx   . Rectal cancer Neg Hx     Past Surgical History:  Procedure Laterality Date  . ABDOMINAL HYSTERECTOMY    . APPENDECTOMY     with  hysterectomy  . CATARACT EXTRACTION Bilateral   . LYMPHADENECTOMY     Benign, age 75  . TONSILLECTOMY AND ADENOIDECTOMY     Social History   Occupational History  . Retired     retired   Social History Main Topics  . Smoking status: Never Smoker  . Smokeless tobacco: Never Used  . Alcohol use 0.0 oz/week     Comment: Occ  . Drug use: No  . Sexual activity: Not on file

## 2017-05-30 ENCOUNTER — Ambulatory Visit (INDEPENDENT_AMBULATORY_CARE_PROVIDER_SITE_OTHER): Payer: Medicare Other | Admitting: Neurology

## 2017-05-30 ENCOUNTER — Encounter: Payer: Self-pay | Admitting: Neurology

## 2017-05-30 VITALS — BP 130/68 | HR 82 | Ht 66.0 in | Wt 156.2 lb

## 2017-05-30 DIAGNOSIS — I671 Cerebral aneurysm, nonruptured: Secondary | ICD-10-CM | POA: Diagnosis not present

## 2017-05-30 DIAGNOSIS — G40009 Localization-related (focal) (partial) idiopathic epilepsy and epileptic syndromes with seizures of localized onset, not intractable, without status epilepticus: Secondary | ICD-10-CM

## 2017-05-30 DIAGNOSIS — I63112 Cerebral infarction due to embolism of left vertebral artery: Secondary | ICD-10-CM

## 2017-05-30 NOTE — Progress Notes (Signed)
NEUROLOGY FOLLOW UP OFFICE NOTE  Allison Bass [id number]  HISTORY OF PRESENT ILLNESS: I had the pleasure of seeing Allison Bass in follow-up in the neurology clinic on 05/30/2017.  The patient was last seen 9 months ago. She initially presented with 2 episodes of transient memory difficulties and a fainting spell in March 2017. 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  ASA was initially not started due to evidence of cerebellar hemorrhage. Stroke workup showed unremarkable carotid doppler, echo normal with EF 65-70%. She had an MRA head and neck which did not show significant stenosis, there was decreased signal in the genu of the ICA bilaterally which may be due to atherosclerotic disease or artifact. 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 Follow-up head CT was normal, she is now on a daily aspirin. Since her last visit, she has been doing well from a neurological standpoint. No further episodes of memory loss or loss of consciousness. She denies any headaches, dizziness, diplopia, dysarthria/dysphagia, focal numbness/tingling/weakness. She has been dealing with a lot of knee pain which appears to be headed for surgery per patient. She has been stressed out recently, relating her stress with obtaining Xanax. 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 The first seizure occurred in the early 1990s, she recalls feeling funny, "almost like I was going under surgery," then waking up in the hospital. Her husband had witnessed a generalized convulsion. She was started on Dilantin. She had been  seizure-free for a while and a trial of Dilantin taper was done perhaps in 1995, during which she had a couple of breakthrough seizures within a week of stopping medication. She denies any further seizures since restarting the Dilantin in 1995. She denies any further episodes of the funny feeling, no  olfactory/gustatory hallucinations, deja vu,focal numbness/tingling/weakness, myoclonic jerks.  Her mother had seizures. Otherwise she had a normal birth and early development.  There is no history of febrile convulsions, CNS infections such as meningitis/encephalitis, significant traumatic brain injury, neurosurgical procedures, or family history of seizures.  She had been doing well until 01/12/16 she woke up and knew there was something she was supposed to do but could not recall it. She called her friend she needed to pick up food for Me[employer] on Whe[employer], and after the reminder, she did not have any other issues that day. The next morning she got up and went to pick up food, then she could not think of where her next destination was. She had to call someone to remind her where to go, then she was fine. There is note that the day prior to the first episode she was upset because her cat ran away. She took her normal TID Xanax and then took 2 more prior to bed to help with sleep.  On 01/18/16, she was at church then had an intense feeling with nausea. The music was coming off the page, she got extremely hot and could hear things from a distance. She started to sit down and recalls her friend saying she could not lift her. She apparently fainted and  woke up spread out on several chairs. She did not have the same "funny feeling" she had in the 1990s. No convulsive activity reported. She denied any sleep deprivation or alcohol the night prior.   Laboratory Data: Lab Results  Component Value Date   HGBA1C 5.6 03/05/2016   Lab Results  Component Value Date   CHOL 216 (H) 03/05/2016   HDL 85.40 03/05/2016   LDLCALC  112 (H) 03/05/2016   TRIG 93.0 03/05/2016   CHOLHDL 3 03/05/2016     PAST MEDICAL HISTORY: Past Medical History:  Diagnosis Date  . Anxiety   . Chicken pox   . Depression   . 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 GERD (gastroesophageal reflux disease)   . Heart murmur   . HTN (hypertension)   . Migraine   . Seasonal allergies     MEDICATIONS: Current Outpatient Prescriptions on File Prior to Visit  Medication Sig Dispense Refill  . ALPRAZolam (XANAX) 0.5 MG tablet Take 1 tablet (0.5 mg total) by mouth 3 (three) times daily. 90 tablet 2  . ARIPiprazole (ABILIFY) 2 MG tablet Take 1 tablet (2 mg total) by mouth daily. 90 tablet 3  . atorvastatin (LIPITOR) 40 MG tablet Take 1 tablet (40 mg total) by mouth daily. 90 tablet 1  . cyclobenzaprine (FLEXERIL) 10 MG tablet Take 1 tablet (10 mg total) by mouth 3 (three) times daily as needed for muscle spasms. 30 tablet 0  . desvenlafaxine (PRISTIQ) 100 MG 24 hr tablet Take 1 tablet (100 mg total) by mouth daily. 90 tablet 3  . DILANTIN 100 MG ER capsule TAKE THREE CAPSULES BY MOUTH ONE TIME DAILY 270 capsule 1  . estrogens, conjugated, (PREMARIN) 1.25 MG tablet Take 1 tablet (1.25 mg total) by mouth daily. 90 tablet 0  . hydrochlorothiazide (HYDRODIURIL) 50 MG tablet TAKE 1 TABLET(50 MG) BY MOUTH DAILY 90 tablet 2  . lisinopril (PRINIVIL,ZESTRIL) 10 MG tablet Take 1 tablet (10 mg total) by mouth daily. 90 tablet 1  . NONFORMULARY OR COMPOUNDED ITEM [facility]:  Onychomycosis nail lacquer - Fluconazole 2%, Terbinafine 1%, DMSO appt to affected area daily. 120 each 2  . potassium chloride (KLOR-CON 10) 10 MEQ tablet Take 2 tablets every morning and 2 tablets every evening. 360 tablet 1   No current facility-administered medications on file prior to visit.     ALLERGIES: Allergies  Allergen Reactions  . Erythromycin   . Codeine Rash    Makes the patient walk into walls    FAMILY HISTORY: Family  History  Problem Relation Age of Onset  . Alcoholism Father   . Lung cancer Father        Smoker  . Stroke Father   . High blood pressure Father   . Breast cancer Maternal Grandmother   . Heart disease Mother   . High blood pressure Mother   . Depression Mother   . Seizures Mother   . Colon cancer Neg Hx   . Esophageal cancer Neg Hx   . Stomach cancer Neg Hx   . Rectal cancer Neg Hx     SOCIAL HISTORY: Social History   Social History  . Marital status: Widowed    Spouse name: N/A  . Number of children: 3  . Years of education: N/A   Occupational History  . Retired     retired   Social History Main Topics  . Smoking status: Never Smoker  . Smokeless tobacco: Never Used  .  Alcohol use 0.0 oz/week     Comment: Occ  . Drug use: No  . Sexual activity: Not on file   Other Topics Concern  . Not on file   Social History Narrative  . No narrative on file    REVIEW OF SYSTEMS: Constitutional: No fevers, chills, or sweats, no generalized fatigue, change in appetite Eyes: No visual changes, double vision, eye pain Ear, nose and throat: No hearing loss, ear pain, nasal congestion, sore throat Cardiovascular: No chest pain, palpitations Respiratory:  No shortness of breath at rest or with exertion, wheezes GastrointestinaI: No nausea, vomiting, diarrhea, abdominal pain, fecal incontinence Genitourinary:  No dysuria, urinary retention or frequency Musculoskeletal:  + neck pain, back pain Integumentary: No rash, pruritus, skin lesions Neurological: as above Psychiatric: No depression, insomnia, anxiety Endocrine: No palpitations, fatigue, diaphoresis, mood swings, change in appetite, change in weight, increased thirst Hematologic/Lymphatic:  No anemia, purpura, petechiae. Allergic/Immunologic: no itchy/runny eyes, nasal congestion, recent allergic reactions, rashes  PHYSICAL EXAM: Vitals:   05/30/17 1607  BP: 130/68  Pulse: 82   General: No acute distress Head:   Normocephalic/atraumatic Neck: supple, no paraspinal tenderness, full range of motion Heart:  Regular rate and rhythm Lungs:  Clear to auscultation bilaterally Back: No paraspinal tenderness Skin/Extremities: No rash, no edema, right knee in gauze wrap Neurological Exam: alert and oriented to person, place, and time. No aphasia or dysarthria. Fund of knowledge is appropriate.  Recent and remote memory are intact.  Attention and concentration are normal.    Able to name objects and repeat phrases. Cranial nerves: Pupils equal, round, reactive to light. Extraocular movements intact with no nystagmus. Visual fields full. Facial sensation intact. No facial asymmetry. Tongue, uvula, palate midline.  Motor: Bulk and tone normal, muscle strength 5/5 throughout with no pronator drift.  Sensation to light touch intact.  No extinction to double simultaneous stimulation.  Deep tendon reflexes 2+ throughout, toes downgoing.  Finger to nose testing intact.  Gait slow and cautious due to knee pain, no ataxia.  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 She has had breakthrough convulsions with attempts at Dilantin taper in the past. Last GTC was in 1995. She presented for 2 episodes of memory loss and an episode of loss of consciousness last 01/18/16. Her neurological exam is non-focal. 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 Repeat head CT no acute hemorrhage, she is now on aspirin 81mg  daily for secondary stroke prevention. She is doing well with no further neurological symptoms. There wass an incidental finding of small aneurysm on MRA, repeat imaging will be ordered to assess for interval change. She is reporting a lot of anxiety and sees a therapist. I discussed seeing a psychiatrist as well for medication management. She is aware of [place]  driving laws to stop driving after an episode of loss of consciousness until 6 months event-free. She will follow-up in 1 year and knows to call for any changes.   Thank you for allowing me to participate in her care.  Please do not hesitate to call for any questions or concerns.  The duration of this appointment visit was 25 minutes of face-to-face time with the patient.  Greater than 50% of this time was spent in counseling, explanation of diagnosis, planning of further management, and coordination of care.   Ellouise Newer, M.D.  CC: Dr. Cheri Rous

## 2017-05-30 NOTE — Patient Instructions (Signed)
1. Schedule MRA head without contrast 2. Continue all your other medications 3. Continue control of blood pressure, cholesterol, and daily aspirin 4. Follow-up in 1 year, call for any changes

## 2017-05-31 ENCOUNTER — Encounter: Payer: Self-pay | Admitting: Neurology

## 2017-06-02 ENCOUNTER — Encounter (INDEPENDENT_AMBULATORY_CARE_PROVIDER_SITE_OTHER): Payer: Self-pay | Admitting: Orthopaedic Surgery

## 2017-06-03 ENCOUNTER — Other Ambulatory Visit (INDEPENDENT_AMBULATORY_CARE_PROVIDER_SITE_OTHER): Payer: Self-pay

## 2017-06-03 ENCOUNTER — Telehealth (INDEPENDENT_AMBULATORY_CARE_PROVIDER_SITE_OTHER): Payer: Self-pay | Admitting: Orthopaedic Surgery

## 2017-06-03 MED ORDER — GABAPENTIN 300 MG PO CAPS
300.0000 mg | ORAL_CAPSULE | Freq: Every day | ORAL | 1 refills | Status: DC
Start: 2017-06-03 — End: 2017-06-24

## 2017-06-03 NOTE — Telephone Encounter (Signed)
Dr. Ninfa Linden will call this in

## 2017-06-03 NOTE — Telephone Encounter (Signed)
Received voicemail message from patient stating she would like to try (Neurontin)  Patient asked that the Rx be called into [facility] on [address]. The number to contact patient is [phone]

## 2017-06-06 ENCOUNTER — Encounter (INDEPENDENT_AMBULATORY_CARE_PROVIDER_SITE_OTHER): Payer: Self-pay | Admitting: Orthopaedic Surgery

## 2017-06-09 ENCOUNTER — Ambulatory Visit (INDEPENDENT_AMBULATORY_CARE_PROVIDER_SITE_OTHER): Payer: Medicare Other | Admitting: Licensed Clinical Social Worker

## 2017-06-09 DIAGNOSIS — F3341 Major depressive disorder, recurrent, in partial remission: Secondary | ICD-10-CM

## 2017-06-11 ENCOUNTER — Ambulatory Visit
Admission: RE | Admit: 2017-06-11 | Discharge: 2017-06-11 | Disposition: A | Discharge status: Home / Self Care | Payer: Medicare Other | Source: Ambulatory Visit | Attending: Neurology | Admitting: Neurology

## 2017-06-11 DIAGNOSIS — I671 Cerebral aneurysm, nonruptured: Secondary | ICD-10-CM | POA: Diagnosis not present

## 2017-06-13 ENCOUNTER — Encounter: Payer: Self-pay | Admitting: Neurology

## 2017-06-14 ENCOUNTER — Telehealth: Payer: Self-pay | Admitting: Neurology

## 2017-06-14 NOTE — Telephone Encounter (Signed)
Spoke to patient about minimal increase in size of aneurysm, discussed recommendation to do angiogram with Dr. Estanislado Pandy Patient reassured, agrees to do referral with Dr. Estanislado Pandy (tel. [phone], fax [phone]). Thanks

## 2017-06-15 ENCOUNTER — Ambulatory Visit (INDEPENDENT_AMBULATORY_CARE_PROVIDER_SITE_OTHER): Payer: Medicare Other | Admitting: Orthopaedic Surgery

## 2017-06-15 DIAGNOSIS — Z9889 Other specified postprocedural states: Secondary | ICD-10-CM

## 2017-06-15 DIAGNOSIS — M1711 Unilateral primary osteoarthritis, right knee: Secondary | ICD-10-CM

## 2017-06-15 NOTE — Progress Notes (Signed)
Patient is now a week status post a right knee arthroscopy. 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 This point she is tried and failed all forms conservative treatment including rest, ice, heat, activity modification, arthroscopic intervention, walking with assistive device and therapy. Her pain is daily and is detrimentally affected her activity is daily life, her mobility, and her quality of life in general. She is will to consider knee replacement surgery at this point given arthroscopic findings as well as the MRI findings. She is already felt another injection in her knee. Parafon exam her right knee has valgus alignment. She has a mild effusion. She has painful range of motion throughout her flexion-extension. The knee feels slightly unstable as well. There is significant patellofemoral crepitation and lateral joint line tenderness. At this point I do feel that she is a candidate for knee replaced surgery given the failure of all forms of conservative treatment and operative treatment. I showed her a knee model of a long and thorough discussion of what her intraoperative and postoperative courses be as well as a thorough discussion of the risks and benefits of the surgery. I showed her knee replacement model as well as. All questions were encouraged and answered. I gave her surgery scheduler's card and she knows to call us if he gets the point when she like to have this scheduled.

## 2017-06-16 ENCOUNTER — Other Ambulatory Visit: Payer: Self-pay

## 2017-06-17 ENCOUNTER — Encounter: Payer: Self-pay | Admitting: Neurology

## 2017-06-17 ENCOUNTER — Telehealth: Payer: Self-pay | Admitting: Neurology

## 2017-06-17 NOTE — Telephone Encounter (Signed)
PT called and said she has not heard from the place that Dr Delice Lesch referred her to for scheduling an appointment, she could not remember the name of the place please call and advise

## 2017-06-20 ENCOUNTER — Other Ambulatory Visit: Payer: Self-pay

## 2017-06-20 DIAGNOSIS — I63113 Cerebral infarction due to embolism of bilateral vertebral arteries: Secondary | ICD-10-CM

## 2017-06-24 ENCOUNTER — Other Ambulatory Visit (INDEPENDENT_AMBULATORY_CARE_PROVIDER_SITE_OTHER): Payer: Self-pay

## 2017-06-24 ENCOUNTER — Other Ambulatory Visit (HOSPITAL_COMMUNITY): Payer: Self-pay | Admitting: Interventional Radiology

## 2017-06-24 DIAGNOSIS — I671 Cerebral aneurysm, nonruptured: Secondary | ICD-10-CM

## 2017-06-24 MED ORDER — GABAPENTIN 300 MG PO CAPS: 300.0000 mg | ORAL_CAPSULE | Freq: Two times a day (BID) | ORAL | 1 refills | Status: DC

## 2017-06-29 ENCOUNTER — Other Ambulatory Visit: Payer: Self-pay | Admitting: Radiology

## 2017-06-29 ENCOUNTER — Other Ambulatory Visit: Payer: Self-pay | Admitting: Student

## 2017-06-29 ENCOUNTER — Ambulatory Visit (HOSPITAL_COMMUNITY)
Admission: RE | Admit: 2017-06-29 | Discharge: 2017-06-29 | Disposition: A | Discharge status: Home / Self Care | Payer: Medicare Other | Source: Ambulatory Visit | Attending: Interventional Radiology | Admitting: Interventional Radiology

## 2017-06-29 DIAGNOSIS — I720 Aneurysm of carotid artery: Secondary | ICD-10-CM | POA: Diagnosis not present

## 2017-06-29 DIAGNOSIS — R51 Headache: Secondary | ICD-10-CM | POA: Diagnosis not present

## 2017-06-29 DIAGNOSIS — I671 Cerebral aneurysm, nonruptured: Secondary | ICD-10-CM

## 2017-06-29 HISTORY — PX: IR RADIOLOGIST EVAL & MGMT: IMG5224

## 2017-06-30 ENCOUNTER — Encounter (HOSPITAL_COMMUNITY): Payer: Self-pay

## 2017-06-30 ENCOUNTER — Ambulatory Visit (HOSPITAL_COMMUNITY): Admission: RE | Admit: 2017-06-30 | Payer: Medicare Other | Source: Ambulatory Visit

## 2017-07-01 ENCOUNTER — Encounter (HOSPITAL_COMMUNITY): Payer: Self-pay | Admitting: Interventional Radiology

## 2017-07-04 ENCOUNTER — Other Ambulatory Visit (INDEPENDENT_AMBULATORY_CARE_PROVIDER_SITE_OTHER): Payer: Self-pay | Admitting: Orthopaedic Surgery

## 2017-07-04 NOTE — Telephone Encounter (Signed)
Ok for refill? 

## 2017-07-04 NOTE — Telephone Encounter (Signed)
Called to pharmacy 

## 2017-07-05 ENCOUNTER — Ambulatory Visit: Payer: Medicare Other | Admitting: Licensed Clinical Social Worker

## 2017-07-06 ENCOUNTER — Telehealth (INDEPENDENT_AMBULATORY_CARE_PROVIDER_SITE_OTHER): Payer: Self-pay | Admitting: Radiology

## 2017-07-06 NOTE — Telephone Encounter (Signed)
Helyn App calling form [facility] wanting to clarify gabapentin prescription. Wanting to make sure it is ok for patient to take bid, rx from 9/10 just stated once a day at bedtime. Advised ok per CB. They will adjust rx for patient.

## 2017-07-14 ENCOUNTER — Other Ambulatory Visit: Payer: Self-pay | Admitting: Radiology

## 2017-07-15 ENCOUNTER — Encounter (HOSPITAL_COMMUNITY): Payer: Self-pay

## 2017-07-15 ENCOUNTER — Other Ambulatory Visit: Payer: Self-pay | Admitting: Radiology

## 2017-07-15 ENCOUNTER — Encounter (HOSPITAL_COMMUNITY)
Admission: RE | Admit: 2017-07-15 | Discharge: 2017-07-15 | Disposition: A | Discharge status: Home / Self Care | Payer: Medicare Other | Source: Ambulatory Visit | Attending: Interventional Radiology | Admitting: Interventional Radiology

## 2017-07-15 ENCOUNTER — Other Ambulatory Visit: Payer: Self-pay

## 2017-07-15 DIAGNOSIS — Z8673 Personal history of transient ischemic attack (TIA), and cerebral infarction without residual deficits: Secondary | ICD-10-CM | POA: Diagnosis not present

## 2017-07-15 DIAGNOSIS — I671 Cerebral aneurysm, nonruptured: Secondary | ICD-10-CM | POA: Diagnosis not present

## 2017-07-15 DIAGNOSIS — G40909 Epilepsy, unspecified, not intractable, without status epilepticus: Secondary | ICD-10-CM | POA: Diagnosis not present

## 2017-07-15 DIAGNOSIS — K219 Gastro-esophageal reflux disease without esophagitis: Secondary | ICD-10-CM | POA: Diagnosis not present

## 2017-07-15 DIAGNOSIS — F419 Anxiety disorder, unspecified: Secondary | ICD-10-CM | POA: Diagnosis not present

## 2017-07-15 DIAGNOSIS — Z888 Allergy status to other drugs, medicaments and biological substances status: Secondary | ICD-10-CM | POA: Diagnosis not present

## 2017-07-15 DIAGNOSIS — Z7982 Long term (current) use of aspirin: Secondary | ICD-10-CM | POA: Diagnosis not present

## 2017-07-15 DIAGNOSIS — Z79899 Other long term (current) drug therapy: Secondary | ICD-10-CM | POA: Diagnosis not present

## 2017-07-15 DIAGNOSIS — Z881 Allergy status to other antibiotic agents status: Secondary | ICD-10-CM | POA: Diagnosis not present

## 2017-07-15 DIAGNOSIS — I10 Essential (primary) hypertension: Secondary | ICD-10-CM | POA: Diagnosis not present

## 2017-07-15 DIAGNOSIS — M1990 Unspecified osteoarthritis, unspecified site: Secondary | ICD-10-CM | POA: Diagnosis not present

## 2017-07-15 DIAGNOSIS — Z791 Long term (current) use of non-steroidal anti-inflammatories (NSAID): Secondary | ICD-10-CM | POA: Diagnosis not present

## 2017-07-15 DIAGNOSIS — F329 Major depressive disorder, single episode, unspecified: Secondary | ICD-10-CM | POA: Diagnosis not present

## 2017-07-15 DIAGNOSIS — Z7902 Long term (current) use of antithrombotics/antiplatelets: Secondary | ICD-10-CM | POA: Diagnosis not present

## 2017-07-15 HISTORY — DX: Unspecified convulsions: R56.9

## 2017-07-15 HISTORY — DX: Cerebral infarction, unspecified: I63.9

## 2017-07-15 HISTORY — DX: Unspecified osteoarthritis, unspecified site: M19.90

## 2017-07-15 LAB — BASIC METABOLIC PANEL
ANION GAP: 8 (ref 5–15)
BUN: 11 mg/dL (ref 6–20)
CALCIUM: 9 mg/dL (ref 8.9–10.3)
CO2: 29 mmol/L (ref 22–32)
Chloride: 97 mmol/L — ABNORMAL LOW (ref 101–111)
Creatinine, Ser: 0.85 mg/dL (ref 0.44–1.00)
GFR calc Af Amer: >60 mL/min (ref >60)
GLUCOSE: 105 mg/dL — AB (ref 65–99)
Potassium: 4.6 mmol/L (ref 3.5–5.1)
SODIUM: 134 mmol/L — AB (ref 135–145)

## 2017-07-15 LAB — CBC
HCT: 39.6 % (ref 36.0–46.0)
Hemoglobin: 13.5 g/dL (ref 12.0–15.0)
MCH: 31.5 pg (ref 26.0–34.0)
MCHC: 34.1 g/dL (ref 30.0–36.0)
MCV: 92.3 fL (ref 78.0–100.0)
Platelets: 219 10*3/uL (ref 150–400)
RBC: 4.29 MIL/uL (ref 3.87–5.11)
RDW: 13.1 % (ref 11.5–15.5)
WBC: 7.1 10*3/uL (ref 4.0–10.5)

## 2017-07-15 LAB — APTT: APTT: 35 s (ref 24–36)

## 2017-07-15 LAB — PROTIME-INR
INR: 0.98
PROTHROMBIN TIME: 12.9 s (ref 11.4–15.2)

## 2017-07-15 NOTE — Pre-Procedure Instructions (Signed)
Allison Bass  07/15/2017      [facility] [address] - [place], [place] - [address], [address] [place]  [address] Phone: [phone] Fax: [phone]    Your procedure is scheduled on   Monday  07/18/17  Report to [facility] at 630 A.M.  Call this number if you have problems the morning of surgery:  [phone]   Remember:  Do not eat food or drink liquids after midnight.  Take these medicines the morning of surgery with A SIP OF WATER   ALPRAZOLAM, DILANTIN, PREMARIN, GABAPENTIN, PRISTIQ  7 days prior to surgery STOP taking any Aspirin, Aleve, Naproxen, Ibuprofen, Motrin, Advil, Goody's, BC's, all herbal medications, fish oil, and all vitamins   Do not wear jewelry, make-up or nail polish.  Do not wear lotions, powders, or perfumes, or deoderant.  Do not shave 48 hours prior to surgery.  Men may shave face and neck.  Do not bring valuables to the hospital.  [facility] is not responsible for any belongings or valuables.  Contacts, dentures or bridgework may not be worn into surgery.  Leave your suitcase in the car.  After surgery it may be brought to your room.  For patients admitted to the hospital, discharge time will be determined by your treatment team.  Patients discharged the day of surgery will not be allowed to drive home.   Name and phone number of your driver:    Special instructions:  [place] - Preparing for Surgery  Before surgery, you can play an important role.  Because skin is not sterile, your skin needs to be as free of germs as possible.  You can reduce the number of germs on you skin by washing with CHG (chlorahexidine gluconate) soap before surgery.  CHG is an antiseptic cleaner which kills germs and bonds with the skin to continue killing germs even after washing.  Please DO NOT use if you have an allergy to CHG or antibacterial soaps.  If your skin becomes reddened/irritated  stop using the CHG and inform your nurse when you arrive at Short Stay.  Do not shave (including legs and underarms) for at least 48 hours prior to the first CHG shower.  You may shave your face.  Please follow these instructions carefully:   1.  Shower with CHG Soap the night before surgery and the                                morning of Surgery.  2.  If you choose to wash your hair, wash your hair first as usual with your       normal shampoo.  3.  After you shampoo, rinse your hair and body thoroughly to remove the                      Shampoo.  4.  Use CHG as you would any other liquid soap.  You can apply chg directly       to the skin and wash gently with scrungie or a clean washcloth.  5.  Apply the CHG Soap to your body ONLY FROM THE NECK DOWN.        Do not use on open wounds or open sores.  Avoid contact with your eyes,       ears, mouth and genitals (private  parts).  Wash genitals (private parts)       with your normal soap.  6.  Wash thoroughly, paying special attention to the area where your surgery        will be performed.  7.  Thoroughly rinse your body with warm water from the neck down.  8.  DO NOT shower/wash with your normal soap after using and rinsing off       the CHG Soap.  9.  Pat yourself dry with a clean towel.            10.  Wear clean pajamas.            11.  Place clean sheets on your bed the night of your first shower and do not        sleep with pets.  Day of Surgery  Do not apply any lotions/deoderants the morning of surgery.  Please wear clean clothes to the hospital/surgery center.    Please read over the following fact sheets that you were given. Pain Booklet

## 2017-07-17 NOTE — Anesthesia Preprocedure Evaluation (Addendum)
Anesthesia Evaluation  Patient identified by MRN, date of birth, ID band Patient awake    Reviewed: Allergy & Precautions, NPO status , Patient's Chart, lab work & pertinent test results  Airway Mallampati: II  TM Distance: >3 FB Neck ROM: Full    Dental no notable dental hx. (+) Edentulous Upper, Edentulous Lower, Dental Advisory Given   Pulmonary neg pulmonary ROS,    Pulmonary exam normal breath sounds clear to auscultation       Cardiovascular hypertension, negative cardio ROS Normal cardiovascular exam+ Valvular Problems/Murmurs  Rhythm:Regular Rate:Normal     Neuro/Psych  Headaches, Seizures -,  CVA negative neurological ROS  negative psych ROS   GI/Hepatic negative GI ROS, Neg liver ROS, GERD  Medicated,  Endo/Other  negative endocrine ROS  Renal/GU negative Renal ROS  negative genitourinary   Musculoskeletal negative musculoskeletal ROS (+) Arthritis , Osteoarthritis,    Abdominal   Peds negative pediatric ROS (+)  Hematology negative hematology ROS (+)   Anesthesia Other Findings   Reproductive/Obstetrics negative OB ROS                          Anesthesia Physical Anesthesia Plan  ASA: III  Anesthesia Plan: General   Post-op Pain Management:    Induction: Intravenous  PONV Risk Score and Plan: 2 and Ondansetron, Dexamethasone, Midazolam and Treatment may vary due to age or medical condition  Airway Management Planned: Oral ETT  Additional Equipment: Arterial line  Intra-op Plan:   Post-operative Plan: Extubation in OR  Informed Consent:   Plan Discussed with:   Anesthesia Plan Comments: (  )      Anesthesia Quick Evaluation

## 2017-07-18 ENCOUNTER — Ambulatory Visit (HOSPITAL_COMMUNITY): Payer: Medicare Other | Admitting: Anesthesiology

## 2017-07-18 ENCOUNTER — Ambulatory Visit (HOSPITAL_COMMUNITY)
Admission: RE | Admit: 2017-07-18 | Discharge: 2017-07-18 | Disposition: A | Discharge status: Home / Self Care | Payer: Medicare Other | Source: Ambulatory Visit | Attending: Interventional Radiology | Admitting: Interventional Radiology

## 2017-07-18 ENCOUNTER — Encounter (HOSPITAL_COMMUNITY)
Admission: RE | Disposition: A | Discharge status: Home / Self Care | Payer: Self-pay | Source: Ambulatory Visit | Attending: Interventional Radiology

## 2017-07-18 ENCOUNTER — Encounter (HOSPITAL_COMMUNITY): Payer: Self-pay | Admitting: Certified Registered"

## 2017-07-18 DIAGNOSIS — I729 Aneurysm of unspecified site: Secondary | ICD-10-CM

## 2017-07-18 DIAGNOSIS — I10 Essential (primary) hypertension: Secondary | ICD-10-CM | POA: Diagnosis not present

## 2017-07-18 DIAGNOSIS — Z7982 Long term (current) use of aspirin: Secondary | ICD-10-CM | POA: Insufficient documentation

## 2017-07-18 DIAGNOSIS — Z7902 Long term (current) use of antithrombotics/antiplatelets: Secondary | ICD-10-CM | POA: Insufficient documentation

## 2017-07-18 DIAGNOSIS — M545 Low back pain: Secondary | ICD-10-CM | POA: Diagnosis not present

## 2017-07-18 DIAGNOSIS — Z888 Allergy status to other drugs, medicaments and biological substances status: Secondary | ICD-10-CM | POA: Insufficient documentation

## 2017-07-18 DIAGNOSIS — F419 Anxiety disorder, unspecified: Secondary | ICD-10-CM | POA: Insufficient documentation

## 2017-07-18 DIAGNOSIS — M1990 Unspecified osteoarthritis, unspecified site: Secondary | ICD-10-CM | POA: Diagnosis not present

## 2017-07-18 DIAGNOSIS — Z79899 Other long term (current) drug therapy: Secondary | ICD-10-CM | POA: Insufficient documentation

## 2017-07-18 DIAGNOSIS — I6523 Occlusion and stenosis of bilateral carotid arteries: Secondary | ICD-10-CM | POA: Diagnosis not present

## 2017-07-18 DIAGNOSIS — Z791 Long term (current) use of non-steroidal anti-inflammatories (NSAID): Secondary | ICD-10-CM | POA: Diagnosis not present

## 2017-07-18 DIAGNOSIS — Z881 Allergy status to other antibiotic agents status: Secondary | ICD-10-CM | POA: Insufficient documentation

## 2017-07-18 DIAGNOSIS — I671 Cerebral aneurysm, nonruptured: Secondary | ICD-10-CM | POA: Diagnosis not present

## 2017-07-18 DIAGNOSIS — G40909 Epilepsy, unspecified, not intractable, without status epilepticus: Secondary | ICD-10-CM | POA: Diagnosis not present

## 2017-07-18 DIAGNOSIS — Z8673 Personal history of transient ischemic attack (TIA), and cerebral infarction without residual deficits: Secondary | ICD-10-CM | POA: Diagnosis not present

## 2017-07-18 DIAGNOSIS — R51 Headache: Secondary | ICD-10-CM | POA: Diagnosis not present

## 2017-07-18 DIAGNOSIS — K219 Gastro-esophageal reflux disease without esophagitis: Secondary | ICD-10-CM | POA: Diagnosis not present

## 2017-07-18 DIAGNOSIS — F329 Major depressive disorder, single episode, unspecified: Secondary | ICD-10-CM | POA: Diagnosis not present

## 2017-07-18 HISTORY — PX: IR ANGIO INTRA EXTRACRAN SEL COM CAROTID INNOMINATE BILAT MOD SED: IMG5360

## 2017-07-18 HISTORY — PX: RADIOLOGY WITH ANESTHESIA: SHX6223

## 2017-07-18 HISTORY — PX: IR ANGIO VERTEBRAL SEL VERTEBRAL BILAT MOD SED: IMG5369

## 2017-07-18 LAB — PLATELET INHIBITION P2Y12: Platelet Function  P2Y12: 50 [PRU] — ABNORMAL LOW (ref 194–418)

## 2017-07-18 SURGERY — RADIOLOGY WITH ANESTHESIA
Anesthesia: General

## 2017-07-18 MED ORDER — LIDOCAINE HCL (PF) 2 % IJ SOLN
INTRAMUSCULAR | Status: AC
  Filled 2017-07-18: qty 10

## 2017-07-18 MED ORDER — LIDOCAINE HCL (PF) 2 % IJ SOLN
INTRAMUSCULAR | Status: AC | PRN
  Administered 2017-07-18: 10 mL

## 2017-07-18 MED ORDER — ALPRAZOLAM 0.5 MG PO TABS
0.5000 mg | ORAL_TABLET | ORAL | Status: AC
  Administered 2017-07-18: 0.5 mg via ORAL

## 2017-07-18 MED ORDER — FENTANYL CITRATE (PF) 100 MCG/2ML IJ SOLN
INTRAMUSCULAR | Status: DC | PRN
  Administered 2017-07-18 (×2): 25 ug via INTRAVENOUS

## 2017-07-18 MED ORDER — ALPRAZOLAM 0.25 MG PO TABS
ORAL_TABLET | ORAL | Status: AC
  Filled 2017-07-18: qty 2

## 2017-07-18 MED ORDER — LACTATED RINGERS IV SOLN
INTRAVENOUS | Status: DC | PRN
  Administered 2017-07-18: 08:00:00 via INTRAVENOUS

## 2017-07-18 MED ORDER — SODIUM CHLORIDE 0.9 % IV SOLN: INTRAVENOUS | Status: AC

## 2017-07-18 MED ORDER — MIDAZOLAM HCL 2 MG/2ML IJ SOLN
INTRAMUSCULAR | Status: DC | PRN
  Administered 2017-07-18: 1 mg via INTRAVENOUS
  Administered 2017-07-18: 0.5 mg via INTRAVENOUS
  Administered 2017-07-18: .5 mg via INTRAVENOUS
  Administered 2017-07-18: 0.5 mg via INTRAVENOUS

## 2017-07-18 MED ORDER — HEPARIN SODIUM (PORCINE) 1000 UNIT/ML IJ SOLN
INTRAMUSCULAR | Status: DC | PRN
  Administered 2017-07-18: 500 [IU] via INTRAVENOUS

## 2017-07-18 MED ORDER — IOPAMIDOL (ISOVUE-300) INJECTION 61%
INTRAVENOUS | Status: AC
  Administered 2017-07-18: 70 mL
  Filled 2017-07-18: qty 50

## 2017-07-18 MED ORDER — IOPAMIDOL (ISOVUE-300) INJECTION 61%
INTRAVENOUS | Status: AC
  Administered 2017-07-18: 10 mL
  Filled 2017-07-18: qty 150

## 2017-07-18 NOTE — Anesthesia Procedure Notes (Signed)
Procedure Name: MAC Date/Time: 07/18/2017 9:10 AM Performed by: Barrington Ellison Pre-anesthesia Checklist: Patient identified, Emergency Drugs available, Suction available, Patient being monitored and Timeout performed Patient Re-evaluated:Patient Re-evaluated prior to induction Oxygen Delivery Method: Simple face mask

## 2017-07-18 NOTE — Discharge Instructions (Signed)
Angiogram, Care After °This sheet gives you information about how to care for yourself after your procedure. Your health care provider may also give you more specific instructions. If you have problems or questions, contact your health care provider. °What can I expect after the procedure? °After the procedure, it is common to have bruising and tenderness at the catheter insertion area. °Follow these instructions at home: °Insertion site care °· Follow instructions from your health care provider about how to take care of your insertion site. Make sure you: °? Wash your hands with soap and water before you change your bandage (dressing). If soap and water are not available, use hand sanitizer. °? Change your dressing as told by your health care provider. °? Leave stitches (sutures), skin glue, or adhesive strips in place. These skin closures may need to stay in place for 2 weeks or longer. If adhesive strip edges start to loosen and curl up, you may trim the loose edges. Do not remove adhesive strips completely unless your health care provider tells you to do that. °· Do not take baths, swim, or use a hot tub until your health care provider approves. °· You may shower 24-48 hours after the procedure or as told by your health care provider. °? Gently wash the site with plain soap and water. °? Pat the area dry with a clean towel. °? Do not rub the site. This may cause bleeding. °· Do not apply powder or lotion to the site. Keep the site clean and dry. °· Check your insertion site every day for signs of infection. Check for: °? Redness, swelling, or pain. °? Fluid or blood. °? Warmth. °? Pus or a bad smell. °Activity °· Rest as told by your health care provider, usually for 1-2 days. °· Do not lift anything that is heavier than 10 lbs. (4.5 kg) or as told by your health care provider. °· Do not drive for 24 hours if you were given a medicine to help you relax (sedative). °· Do not drive or use heavy machinery while  taking prescription pain medicine. °General instructions °· Return to your normal activities as told by your health care provider, usually in about a week. Ask your health care provider what activities are safe for you. °· If the catheter site starts bleeding, lie flat and put pressure on the site. If the bleeding does not stop, get help right away. This is a medical emergency. °· Drink enough fluid to keep your urine clear or pale yellow. This helps flush the contrast dye from your body. °· Take over-the-counter and prescription medicines only as told by your health care provider. °· Keep all follow-up visits as told by your health care provider. This is important. °Contact a health care provider if: °· You have a fever or chills. °· You have redness, swelling, or pain around your insertion site. °· You have fluid or blood coming from your insertion site. °· The insertion site feels warm to the touch. °· You have pus or a bad smell coming from your insertion site. °· You have bruising around the insertion site. °· You notice blood collecting in the tissue around the catheter site (hematoma). The hematoma may be painful to the touch. °Get help right away if: °· You have severe pain at the catheter insertion area. °· The catheter insertion area swells very fast. °· The catheter insertion area is bleeding, and the bleeding does not stop when you hold steady pressure on the area. °·   The area near or just beyond the catheter insertion site becomes pale, cool, tingly, or numb. °These symptoms may represent a serious problem that is an emergency. Do not wait to see if the symptoms will go away. Get medical help right away. Call your local emergency services (911 in the [place] Do not drive yourself to the hospital. °Summary °· After the procedure, it is common to have bruising and tenderness at the catheter insertion area. °· After the procedure, it is important to rest and drink plenty of fluids. °· Do not take baths,  swim, or use a hot tub until your health care provider says it is okay to do so. You may shower 24-48 hours after the procedure or as told by your health care provider. °· If the catheter site starts bleeding, lie flat and put pressure on the site. If the bleeding does not stop, get help right away. This is a medical emergency. °This information is not intended to replace advice given to you by your health care provider. Make sure you discuss any questions you have with your health care provider. °Document Released: 04/29/2005 Document Revised: 09/15/2016 Document Reviewed: 09/15/2016 °Elsevier Interactive Patient Education © 2017 [employer] °Moderate Conscious Sedation, Adult, Care After °These instructions provide you with information about caring for yourself after your procedure. Your health care provider may also give you more specific instructions. Your treatment has been planned according to current medical practices, but problems sometimes occur. Call your health care provider if you have any problems or questions after your procedure. °What can I expect after the procedure? °After your procedure, it is common: °· To feel sleepy for several hours. °· To feel clumsy and have poor balance for several hours. °· To have poor judgment for several hours. °· To vomit if you eat too soon. ° °Follow these instructions at home: °For at least 24 hours after the procedure: ° °· Do not: °? Participate in activities where you could fall or become injured. °? Drive. °? Use heavy machinery. °? Drink alcohol. °? Take sleeping pills or medicines that cause drowsiness. °? Make important decisions or sign legal documents. °? Take care of children on your own. °· Rest. °Eating and drinking °· Follow the diet recommended by your health care provider. °· If you vomit: °? Drink water, juice, or soup when you can drink without vomiting. °? Make sure you have little or no nausea before eating solid foods. °General  instructions °· Have a responsible adult stay with you until you are awake and alert. °· Take over-the-counter and prescription medicines only as told by your health care provider. °· If you smoke, do not smoke without supervision. °· Keep all follow-up visits as told by your health care provider. This is important. °Contact a health care provider if: °· You keep feeling nauseous or you keep vomiting. °· You feel light-headed. °· You develop a rash. °· You have a fever. °Get help right away if: °· You have trouble breathing. °This information is not intended to replace advice given to you by your health care provider. Make sure you discuss any questions you have with your health care provider. °Document Released: 08/01/2013 Document Revised: 03/15/2016 Document Reviewed: 01/31/2016 °Elsevier Interactive Patient Education © 2018 [employer] ° ° °

## 2017-07-18 NOTE — Anesthesia Postprocedure Evaluation (Signed)
Anesthesia Post Note  Patient: Allison Bass  Procedure(s) Performed: Procedure(s) (LRB): EMBOLIZATION (N/A)     Patient location during evaluation: PACU Anesthesia Type: MAC Level of consciousness: awake and alert Pain management: pain level controlled Vital Signs Assessment: post-procedure vital signs reviewed and stable Respiratory status: spontaneous breathing, nonlabored ventilation, respiratory function stable and patient connected to nasal cannula oxygen Cardiovascular status: stable and blood pressure returned to baseline Postop Assessment: no apparent nausea or vomiting Anesthetic complications: no    Last Vitals:  Vitals:   07/18/17 0639  BP: (!) 142/46  Pulse: 72  Resp: 20  Temp: 36.5 C  SpO2: 100%    Last Pain:  Vitals:   07/18/17 0639  TempSrc: Oral                 Javier Mamone

## 2017-07-18 NOTE — Procedures (Signed)
S/P 4 vessel cerebral arteriogram. RT CFA approach. Findings. 1.approx 2.41mm x 2.36mm  Lt ICA periophthalmic wide necked aneurysm.

## 2017-07-18 NOTE — H&P (Signed)
Chief Complaint: left periophthalmic aneurysm   Referring Physician:Dr. Ellouise Newer  Supervising Physician: Luanne Bras  Patient Status: [facility] - Out-pt  HPI: Allison Bass is a 76 y.o. female who was referred to Dr. 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  Please see his H&P for further details.  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  This has increased in size from 16mm to 71mm over her last 2 scans.  She presents today with no new complaints and for her angiogram.  Past Medical History:  Past Medical History:  Diagnosis Date  . Anxiety   . Arthritis   . Chicken pox   . Depression   . 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 GERD (gastroesophageal reflux disease)   . Heart murmur   . HTN (hypertension)   . Migraine   . Seasonal allergies   . Seizures ([place])   . Stroke [facility])    2017    Past Surgical History:  Past Surgical History:  Procedure Laterality Date  . ABDOMINAL HYSTERECTOMY    . APPENDECTOMY     with hysterectomy  . CATARACT EXTRACTION Bilateral   . IR RADIOLOGIST EVAL & MGMT  06/29/2017  . KNEE ARTHROSCOPY     RIGHT  . LYMPHADENECTOMY     Benign, age 67  . TONSILLECTOMY AND ADENOIDECTOMY      Family History:  Family History  Problem Relation Age of Onset  . Alcoholism Father   . Lung cancer Father        Smoker  . Stroke Father   . High blood pressure Father   . Breast cancer Maternal Grandmother   . Heart disease Mother   . High blood pressure Mother   . Depression Mother   . Seizures Mother   . Colon cancer Neg Hx   . Esophageal cancer Neg Hx   . Stomach cancer Neg Hx   . Rectal cancer Neg Hx     Social History:  reports that she has never smoked. She has never used smokeless tobacco. She reports that she drinks alcohol. She reports that she does  not use drugs.  Allergies:  Allergies  Allergen Reactions  . Erythromycin     UNSPECIFIED REACTION   . Codeine Rash    Makes the patient walk into walls    Medications: Medications reviewed in epic  Please HPI for pertinent positives, otherwise complete 10 system ROS negative.  Mallampati Score: MD Evaluation Airway: WNL Heart: WNL Abdomen: WNL Chest/ Lungs: WNL ASA  Classification: 3 Mallampati/Airway Score: One  Physical Exam: BP (!) 142/46   Pulse 72   Temp 97.7 F (36.5 C) (Oral)   Resp 20   Wt 162 lb (73.5 kg)   SpO2 100%   BMI 26.15 kg/m  Body mass index is 26.15 kg/m. General: pleasant, WD, WN white female who is laying in bed in NAD HEENT: head is normocephalic, atraumatic.  Sclera are noninjected.  PERRL.  Ears and nose without any masses or lesions.  Mouth is pink and moist Heart: regular, rate, and rhythm.  Normal s1,s2. No obvious murmurs, gallops, or rubs noted.  Palpable radial and pedal pulses bilaterally Lungs: CTAB, no wheezes, rhonchi, or rales noted.  Respiratory effort nonlabored Abd: soft, NT, ND, +BS, no masses, hernias, or organomegaly Psych: A&Ox3 with an  appropriate affect.   Labs: Results for orders placed or performed during the hospital encounter of 07/18/17 (from the past 48 hour(s))  Platelet inhibition p2y12 (Not at [facility])     Status: Abnormal   Collection Time: 07/18/17  7:30 AM  Result Value Ref Range   Platelet Function  P2Y12 50 (L) 194 - 418 PRU    Comment:        The literature has shown a direct correlation of PRU values over 230 with higher risks of thrombotic events.  Lower PRU values are associated with platelet inhibition.     Imaging: No results found.  Assessment/Plan 1. Left periophthalmic aneurysm   Dr. Estanislado Pandy has researched effects on cavernoma with anticoagulation and found that if these are stable in other appropriate settings that there is no increase risk for hemorrhage.  However, her cavernoma has  increased from 10 to 50mm. He feels it is safest to proceed today with an angiogram and hold on intervention until he can more closely follow this cavernoma with a repeat MRI.  Her labs and vitals have been reviewed.  She understands why we do no plan for intervention today.  She will be scheduled for an MRI and follow up with Dr. Estanislado Pandy to determine when or if intervention is possible.  She will discontinue use of her plavix at this time. Risks and benefits of cerebral angiogram were discussed with the patient including, but not limited to bleeding, infection, vascular injury or contrast induced renal failure.  This interventional procedure involves the use of X-rays and because of the nature of the planned procedure, it is possible that we will have prolonged use of X-ray fluoroscopy.  Potential radiation risks to you include (but are not limited to) the following: - A slightly elevated risk for cancer  several years later in life. This risk is typically less than 0.5% percent. This risk is low in comparison to the normal incidence of human cancer, which is 33% for women and 50% for men according to the [place]. - Radiation induced injury can include skin redness, resembling a rash, tissue breakdown / ulcers and hair loss (which can be temporary or permanent).   The likelihood of either of these occurring depends on the difficulty of the procedure and whether you are sensitive to radiation due to previous procedures, disease, or genetic conditions.   IF your procedure requires a prolonged use of radiation, you will be notified and given written instructions for further action.  It is your responsibility to monitor the irradiated area for the 2 weeks following the procedure and to notify your physician if you are concerned that you have suffered a radiation induced injury.    All of the patient's questions were answered, patient is agreeable to proceed.  Consent signed and in  chart.   Thank you for this interesting consult.  I greatly enjoyed meeting Allison Bass and look forward to participating in their care.  A copy of this report was sent to the requesting provider on this date.  Electronically Signed: Henreitta Cea 07/18/2017, 8:49 AM   I spent a total of    25 Minutes in face to face in clinical consultation, greater than 50% of which was counseling/coordinating care for left periophthalmic aneurysm

## 2017-07-18 NOTE — Transfer of Care (Signed)
Immediate Anesthesia Transfer of Care Note  Patient: Allison Bass  Procedure(s) Performed: Procedure(s): EMBOLIZATION (N/A)  Patient Location: Short Stay  Anesthesia Type:MAC  Level of Consciousness: awake, alert  and oriented  Airway & Oxygen Therapy: Patient Spontanous Breathing  Post-op Assessment: Report given to RN  Post vital signs: Reviewed and stable  Last Vitals:  Vitals:   07/18/17 0639  BP: (!) 142/46  Pulse: 72  Resp: 20  Temp: 36.5 C  SpO2: 100%    Last Pain:  Vitals:   07/18/17 0639  TempSrc: Oral         Complications: No apparent anesthesia complications

## 2017-07-18 NOTE — Progress Notes (Signed)
PA paged by short stay- patient with anxiety post-procedure.  PA to bedside.  Patient is stable but upset that she was unable to proceed with intervention due to cavernoma.  She states the change in plans has made her feel anxious.  Patient and son verbalize understanding of Dr. Arlean Hopping plan to reassess cavernoma prior to proceeding with intervention which would require patient to remain on antiplatelet therapy. She is ordered her home dose of Xanax for anxiety.  She is eating lunch during visit.   Continue recovery.  PA spoke with Miles Costain regarding plans for MRI.  She will contact family with date and time of MRI appointment.   Brynda Greathouse, MS RD PA-C 12:53 PM

## 2017-07-19 ENCOUNTER — Other Ambulatory Visit (HOSPITAL_COMMUNITY): Payer: Self-pay | Admitting: Interventional Radiology

## 2017-07-19 ENCOUNTER — Encounter (HOSPITAL_COMMUNITY): Payer: Self-pay | Admitting: Interventional Radiology

## 2017-07-19 DIAGNOSIS — I729 Aneurysm of unspecified site: Secondary | ICD-10-CM

## 2017-07-20 ENCOUNTER — Encounter (HOSPITAL_COMMUNITY): Payer: Self-pay | Admitting: *Deleted

## 2017-07-20 NOTE — Progress Notes (Signed)
Spoke with pt for pre-op call. Pt denies cardiac history, chest pain or sob. Pt states she is not diabetic.  

## 2017-07-21 ENCOUNTER — Ambulatory Visit (HOSPITAL_COMMUNITY): Payer: Medicare Other | Admitting: Anesthesiology

## 2017-07-21 ENCOUNTER — Ambulatory Visit (HOSPITAL_COMMUNITY): Payer: Self-pay

## 2017-07-21 ENCOUNTER — Encounter (HOSPITAL_COMMUNITY): Payer: Self-pay | Admitting: Anesthesiology

## 2017-07-21 ENCOUNTER — Ambulatory Visit (HOSPITAL_COMMUNITY)
Admission: RE | Admit: 2017-07-21 | Discharge: 2017-07-21 | Disposition: A | Discharge status: Home / Self Care | Payer: Medicare Other | Source: Ambulatory Visit | Attending: Interventional Radiology | Admitting: Interventional Radiology

## 2017-07-21 ENCOUNTER — Encounter (HOSPITAL_COMMUNITY)
Admission: RE | Disposition: A | Discharge status: Home / Self Care | Payer: Self-pay | Source: Ambulatory Visit | Attending: Interventional Radiology

## 2017-07-21 DIAGNOSIS — Z8673 Personal history of transient ischemic attack (TIA), and cerebral infarction without residual deficits: Secondary | ICD-10-CM | POA: Diagnosis not present

## 2017-07-21 DIAGNOSIS — K219 Gastro-esophageal reflux disease without esophagitis: Secondary | ICD-10-CM | POA: Insufficient documentation

## 2017-07-21 DIAGNOSIS — M1990 Unspecified osteoarthritis, unspecified site: Secondary | ICD-10-CM | POA: Diagnosis not present

## 2017-07-21 DIAGNOSIS — I671 Cerebral aneurysm, nonruptured: Secondary | ICD-10-CM | POA: Insufficient documentation

## 2017-07-21 DIAGNOSIS — F419 Anxiety disorder, unspecified: Secondary | ICD-10-CM | POA: Diagnosis not present

## 2017-07-21 DIAGNOSIS — I10 Essential (primary) hypertension: Secondary | ICD-10-CM | POA: Diagnosis not present

## 2017-07-21 DIAGNOSIS — F329 Major depressive disorder, single episode, unspecified: Secondary | ICD-10-CM | POA: Insufficient documentation

## 2017-07-21 DIAGNOSIS — I63012 Cerebral infarction due to thrombosis of left vertebral artery: Secondary | ICD-10-CM | POA: Diagnosis not present

## 2017-07-21 DIAGNOSIS — I639 Cerebral infarction, unspecified: Secondary | ICD-10-CM | POA: Diagnosis not present

## 2017-07-21 DIAGNOSIS — M1711 Unilateral primary osteoarthritis, right knee: Secondary | ICD-10-CM | POA: Diagnosis not present

## 2017-07-21 DIAGNOSIS — I729 Aneurysm of unspecified site: Secondary | ICD-10-CM

## 2017-07-21 HISTORY — PX: RADIOLOGY WITH ANESTHESIA: SHX6223

## 2017-07-21 HISTORY — DX: Pneumonia, unspecified organism: J18.9

## 2017-07-21 SURGERY — RADIOLOGY WITH ANESTHESIA
Anesthesia: General

## 2017-07-21 MED ORDER — SUCCINYLCHOLINE CHLORIDE 20 MG/ML IJ SOLN
INTRAMUSCULAR | Status: DC | PRN
  Administered 2017-07-21: 100 mg via INTRAVENOUS

## 2017-07-21 MED ORDER — PROMETHAZINE HCL 25 MG/ML IJ SOLN: 6.2500 mg | INTRAMUSCULAR | Status: DC | PRN

## 2017-07-21 MED ORDER — DEXAMETHASONE SODIUM PHOSPHATE 10 MG/ML IJ SOLN
INTRAMUSCULAR | Status: DC | PRN
  Administered 2017-07-21: 10 mg via INTRAVENOUS

## 2017-07-21 MED ORDER — FENTANYL CITRATE (PF) 100 MCG/2ML IJ SOLN
INTRAMUSCULAR | Status: DC | PRN
  Administered 2017-07-21 (×2): 50 ug via INTRAVENOUS

## 2017-07-21 MED ORDER — MIDAZOLAM HCL 5 MG/5ML IJ SOLN
INTRAMUSCULAR | Status: DC | PRN
  Administered 2017-07-21: 2 mg via INTRAVENOUS

## 2017-07-21 MED ORDER — PHENYLEPHRINE HCL 10 MG/ML IJ SOLN
INTRAMUSCULAR | Status: DC | PRN
  Administered 2017-07-21 (×2): 80 ug via INTRAVENOUS

## 2017-07-21 MED ORDER — EPHEDRINE SULFATE 50 MG/ML IJ SOLN
INTRAMUSCULAR | Status: DC | PRN
  Administered 2017-07-21 (×2): 10 mg via INTRAVENOUS

## 2017-07-21 MED ORDER — ONDANSETRON HCL 4 MG/2ML IJ SOLN
INTRAMUSCULAR | Status: DC | PRN
  Administered 2017-07-21: 4 mg via INTRAVENOUS

## 2017-07-21 MED ORDER — MEPERIDINE HCL 25 MG/ML IJ SOLN: 6.2500 mg | INTRAMUSCULAR | Status: DC | PRN

## 2017-07-21 MED ORDER — PROPOFOL 10 MG/ML IV BOLUS
INTRAVENOUS | Status: DC | PRN
  Administered 2017-07-21: 150 mg via INTRAVENOUS

## 2017-07-21 MED ORDER — FENTANYL CITRATE (PF) 100 MCG/2ML IJ SOLN: 25.0000 ug | INTRAMUSCULAR | Status: DC | PRN

## 2017-07-21 MED ORDER — LACTATED RINGERS IV SOLN
INTRAVENOUS | Status: DC
  Administered 2017-07-21 (×3): via INTRAVENOUS

## 2017-07-21 MED ORDER — LIDOCAINE HCL (CARDIAC) 20 MG/ML IV SOLN
INTRAVENOUS | Status: DC | PRN
  Administered 2017-07-21: 100 mg via INTRAVENOUS

## 2017-07-21 NOTE — H&P (Signed)
Anesthesia H&P Update: History and Physical Exam reviewed; patient is OK for planned anesthetic and procedure. ? ?

## 2017-07-21 NOTE — Anesthesia Preprocedure Evaluation (Signed)
Anesthesia Evaluation  Patient identified by MRN, date of birth, ID band Patient awake    Reviewed: Allergy & Precautions, NPO status , Patient's Chart, lab work & pertinent test results  Airway Mallampati: II  TM Distance: >3 FB Neck ROM: Full    Dental no notable dental hx. (+) Edentulous Upper, Edentulous Lower, Dental Advisory Given   Pulmonary pneumonia,    Pulmonary exam normal breath sounds clear to auscultation       Cardiovascular hypertension, Pt. on medications Normal cardiovascular exam+ Valvular Problems/Murmurs  Rhythm:Regular Rate:Normal     Neuro/Psych  Headaches, Seizures -,  PSYCHIATRIC DISORDERS Anxiety Depression CVA negative neurological ROS  negative psych ROS   GI/Hepatic negative GI ROS, Neg liver ROS, GERD  Medicated,  Endo/Other  negative endocrine ROS  Renal/GU negative Renal ROS     Musculoskeletal negative musculoskeletal ROS (+) Arthritis , Osteoarthritis,    Abdominal   Peds  Hematology negative hematology ROS (+)   Anesthesia Other Findings   Reproductive/Obstetrics negative OB ROS                             Anesthesia Physical  Anesthesia Plan  ASA: III  Anesthesia Plan: General   Post-op Pain Management:    Induction: Intravenous  PONV Risk Score and Plan: 2 and Ondansetron, Dexamethasone, Midazolam and Treatment may vary due to age or medical condition  Airway Management Planned: Oral ETT  Additional Equipment: Arterial line  Intra-op Plan:   Post-operative Plan: Extubation in OR  Informed Consent: I have reviewed the patients History and Physical, chart, labs and discussed the procedure including the risks, benefits and alternatives for the proposed anesthesia with the patient or authorized representative who has indicated his/her understanding and acceptance.   Dental advisory given  Plan Discussed with: CRNA  Anesthesia Plan  Comments: (  )        Anesthesia Quick Evaluation

## 2017-07-21 NOTE — Transfer of Care (Signed)
Immediate Anesthesia Transfer of Care Note  Patient: Allison Bass  Procedure(s) Performed: Procedure(s): MRI BRAIN WITHOUT (N/A)  Patient Location: PACU  Anesthesia Type:General  Level of Consciousness: awake, alert , oriented and patient cooperative  Airway & Oxygen Therapy: Patient Spontanous Breathing and Patient connected to nasal cannula oxygen  Post-op Assessment: Report given to RN and Post -op Vital signs reviewed and stable  Post vital signs: Reviewed and stable  Last Vitals:  Vitals:   07/21/17 0710 07/21/17 1125  BP: (!) 150/62 125/61  Pulse: 73 76  Resp: 20 12  Temp: 36.6 C 36.4 C  SpO2: 100% 100%    Last Pain:  Vitals:   07/21/17 0710  TempSrc: Oral      Patients Stated Pain Goal: 0 (60/10/93 2355)  Complications: No apparent anesthesia complications

## 2017-07-22 ENCOUNTER — Encounter (HOSPITAL_COMMUNITY): Payer: Self-pay | Admitting: Radiology

## 2017-07-22 NOTE — Anesthesia Postprocedure Evaluation (Signed)
Anesthesia Post Note  Patient: Allison Bass  Procedure(s) Performed: Procedure(s) (LRB): MRI BRAIN WITHOUT (N/A)     Patient location during evaluation: PACU Anesthesia Type: General Level of consciousness: sedated and patient cooperative Pain management: pain level controlled Vital Signs Assessment: post-procedure vital signs reviewed and stable Respiratory status: spontaneous breathing Cardiovascular status: stable Anesthetic complications: no    Last Vitals:  Vitals:   07/21/17 1210 07/21/17 1215  BP: 93/70 125/63  Pulse: 88 78  Resp: (!) 21 16  Temp: (!) 36.3 C (!) 36.4 C  SpO2: 100% 97%    Last Pain:  Vitals:   07/21/17 0710  TempSrc: Oral                 Nolon Nations

## 2017-07-25 ENCOUNTER — Telehealth (HOSPITAL_COMMUNITY): Payer: Self-pay

## 2017-07-25 NOTE — Telephone Encounter (Signed)
Pt agreed to f/u with mri in 8 weeks. She also wanted to know if it was ok to continue with her knee exercises since her knee surgery would be postponed for now. She would like to start doing aqua aerobics. I told her that I would get with Dr. Estanislado Pandy and call back. AW

## 2017-07-27 ENCOUNTER — Other Ambulatory Visit (HOSPITAL_COMMUNITY): Payer: Self-pay | Admitting: Interventional Radiology

## 2017-07-27 ENCOUNTER — Ambulatory Visit (HOSPITAL_COMMUNITY)
Admission: RE | Admit: 2017-07-27 | Discharge: 2017-07-27 | Disposition: A | Discharge status: Home / Self Care | Payer: Medicare Other | Source: Ambulatory Visit | Attending: Interventional Radiology | Admitting: Interventional Radiology

## 2017-07-27 DIAGNOSIS — I729 Aneurysm of unspecified site: Secondary | ICD-10-CM

## 2017-07-27 HISTORY — PX: IR RADIOLOGIST EVAL & MGMT: IMG5224

## 2017-07-31 ENCOUNTER — Encounter: Payer: Self-pay | Admitting: Neurology

## 2017-08-08 ENCOUNTER — Other Ambulatory Visit (HOSPITAL_COMMUNITY): Payer: Self-pay | Admitting: Interventional Radiology

## 2017-08-08 DIAGNOSIS — I671 Cerebral aneurysm, nonruptured: Secondary | ICD-10-CM

## 2017-08-08 DIAGNOSIS — D1800 Hemangioma unspecified site: Secondary | ICD-10-CM

## 2017-08-24 ENCOUNTER — Encounter (HOSPITAL_COMMUNITY): Payer: Self-pay | Admitting: Interventional Radiology

## 2017-09-11 ENCOUNTER — Other Ambulatory Visit: Payer: Self-pay | Admitting: Family Medicine

## 2017-09-11 DIAGNOSIS — Z780 Asymptomatic menopausal state: Secondary | ICD-10-CM

## 2017-09-11 DIAGNOSIS — F419 Anxiety disorder, unspecified: Secondary | ICD-10-CM

## 2017-09-13 NOTE — Telephone Encounter (Signed)
Pharmacy requesting refill for alprazolam  Database ran and placed on your desk  Last filled per database: 08/14/17 Last written: 05/24/17 Last ov: 05/24/17 Next ov: none Contract: 10/13/16 UDS: Past Due

## 2017-09-16 ENCOUNTER — Ambulatory Visit (HOSPITAL_COMMUNITY)
Admission: RE | Admit: 2017-09-16 | Discharge: 2017-09-16 | Disposition: A | Discharge status: Home / Self Care | Payer: Medicare Other | Source: Ambulatory Visit | Attending: Interventional Radiology | Admitting: Interventional Radiology

## 2017-09-16 DIAGNOSIS — J323 Chronic sphenoidal sinusitis: Secondary | ICD-10-CM | POA: Diagnosis not present

## 2017-09-16 DIAGNOSIS — I671 Cerebral aneurysm, nonruptured: Secondary | ICD-10-CM | POA: Diagnosis not present

## 2017-09-16 DIAGNOSIS — J0130 Acute sphenoidal sinusitis, unspecified: Secondary | ICD-10-CM | POA: Diagnosis not present

## 2017-09-16 DIAGNOSIS — D1800 Hemangioma unspecified site: Secondary | ICD-10-CM

## 2017-09-16 DIAGNOSIS — D1801 Hemangioma of skin and subcutaneous tissue: Secondary | ICD-10-CM | POA: Insufficient documentation

## 2017-09-16 MED ORDER — LORAZEPAM 1 MG PO TABS
1.0000 mg | ORAL_TABLET | Freq: Once | ORAL | Status: AC
  Administered 2017-09-16: 1 mg via ORAL
  Filled 2017-09-16: qty 1

## 2017-09-16 MED ORDER — LORAZEPAM 1 MG PO TABS
ORAL_TABLET | ORAL | Status: AC
  Filled 2017-09-16: qty 1

## 2017-09-16 NOTE — Progress Notes (Signed)
Pt here for MRI- states she was informed she could get some meds prior to MRI for claustraphobia. Dr Estanislado Pandy notified and orders obtained. Pt states she had to go under anesthesia for prior MRI's. RN asked why not this time. Pt stated she could not "afford" that. Ativan po given as ordered. Family with her for visit. Pt wants to see if meds will help and proceed with MRI as does family member.

## 2017-09-20 ENCOUNTER — Telehealth: Payer: Self-pay

## 2017-09-20 NOTE — Telephone Encounter (Signed)
Received call from pt.  She was not making much sense.  States that Dr. Estanislado Pandy did angiogram and MRI - both reports are in Epic. Pt states that she is needing her knee replaced and will need to be taken off of blood thinners for the surgery.  She states that Dr. Estanislado Pandy "just wants to keep an eye" on the aneurysm, which means that pt will need to continue on blood thinners.  Pt states that Dr. Estanislado Pandy "keeps pushing this off" and "putting the decision in the hands of the patient" to which she feels uncomfortable because she "does not have a medical degree and should not be forced to make her own medical decisions"  She is asking if she should have another 2nd opinion as well as for some guidance from Dr. Delice Lesch.  Please advise.

## 2017-09-21 NOTE — Telephone Encounter (Signed)
Spoke to patient about her concerns, she is more calm, her main concern is that aneurysm will be monitored and repeat imaging is 3 months from last, when she is in so much pain from her knee that she is wondering if she can proceed with knee surgery before the 3 month follow-up imaging. I discussed with her that this should be fine, but would confirm with Dr. Estanislado Pandy. She has a f/u with him on Friday.

## 2017-09-22 ENCOUNTER — Other Ambulatory Visit (HOSPITAL_COMMUNITY): Payer: Self-pay | Admitting: Interventional Radiology

## 2017-09-22 DIAGNOSIS — D1800 Hemangioma unspecified site: Secondary | ICD-10-CM

## 2017-09-23 ENCOUNTER — Ambulatory Visit (HOSPITAL_COMMUNITY)
Admission: RE | Admit: 2017-09-23 | Discharge: 2017-09-23 | Disposition: A | Discharge status: Home / Self Care | Payer: Medicare Other | Source: Ambulatory Visit | Attending: Interventional Radiology | Admitting: Interventional Radiology

## 2017-09-23 DIAGNOSIS — D1800 Hemangioma unspecified site: Secondary | ICD-10-CM

## 2017-09-23 DIAGNOSIS — I671 Cerebral aneurysm, nonruptured: Secondary | ICD-10-CM | POA: Diagnosis not present

## 2017-09-23 DIAGNOSIS — M25561 Pain in right knee: Secondary | ICD-10-CM | POA: Diagnosis not present

## 2017-09-23 HISTORY — PX: IR RADIOLOGIST EVAL & MGMT: IMG5224

## 2017-09-26 ENCOUNTER — Encounter (HOSPITAL_COMMUNITY): Payer: Self-pay | Admitting: Interventional Radiology

## 2017-10-03 ENCOUNTER — Ambulatory Visit (INDEPENDENT_AMBULATORY_CARE_PROVIDER_SITE_OTHER): Payer: Medicare Other | Admitting: Orthopaedic Surgery

## 2017-10-11 ENCOUNTER — Ambulatory Visit (INDEPENDENT_AMBULATORY_CARE_PROVIDER_SITE_OTHER): Payer: Medicare Other

## 2017-10-11 ENCOUNTER — Ambulatory Visit (INDEPENDENT_AMBULATORY_CARE_PROVIDER_SITE_OTHER): Payer: Medicare Other | Admitting: Orthopaedic Surgery

## 2017-10-11 ENCOUNTER — Encounter (INDEPENDENT_AMBULATORY_CARE_PROVIDER_SITE_OTHER): Payer: Self-pay | Admitting: Orthopaedic Surgery

## 2017-10-11 DIAGNOSIS — M25561 Pain in right knee: Secondary | ICD-10-CM

## 2017-10-11 DIAGNOSIS — G8929 Other chronic pain: Secondary | ICD-10-CM | POA: Diagnosis not present

## 2017-10-11 DIAGNOSIS — I63112 Cerebral infarction due to embolism of left vertebral artery: Secondary | ICD-10-CM | POA: Diagnosis not present

## 2017-10-11 DIAGNOSIS — M1711 Unilateral primary osteoarthritis, right knee: Secondary | ICD-10-CM | POA: Diagnosis not present

## 2017-10-11 NOTE — Progress Notes (Signed)
Office Visit Note   Patient: Allison Bass           Date of Birth: 03/04/1941           MRN: [id number] Visit Date: 10/11/2017              Requested by: Carollee Herter, [place], [place] [address] [place], [place] [address] PCP: Ann Held, DO   Assessment & Plan: Given the severity of arthritis as well as the severity of her pain visit Diagnoses:  1. Chronic pain of right knee   2. Unilateral primary osteoarthritis, right knee     Plan: Combined with the failure of all forms of conservative treatment measures for over a year now I agree with her proceeding with a total knee arthroplasty.  We talked about the risk and benefits of this in detail and that it should be on blood thinning medication after this surgery.  All questions and concerns were answered and addressed.  We talked about the intraoperative and postoperative risks and I certainly model and went over her x-rays in detail.  We will work on getting this scheduled and we will see her back in 2 weeks postoperative for continued assessment and follow-up.  Follow-Up Instructions: Return for 2 weeks post-op.   Orders:  Orders Placed This Encounter  Procedures  . XR Knee 1-2 Views Right   No orders of the defined types were placed in this encounter.     Procedures: No procedures performed   Clinical Data: No additional findings.   Subjective: Chief Complaint  Patient presents with  . Right Knee - Pain  Patient comes in today for continued treatment of severe right knee osteoarthritis and valgus malalignment.  She is now ambulate with a walker because the pain is so severe.  We have tried and failed all forms of conservative treatment including rest, ice, heat, therapy on that leg.  Multiple injections and even arthroscopic surgery.  Arthroscopic surgery showed significant cartilage loss with areas of full-thickness throughout the space of the lateral compartment.  Her pain now is daily  and is 10 out of 10.  His treatment is been for well over a year now.  This is detrimentally affecting her active daily living, quality of life, and her mobility.  At this point she does wish to proceed with total knee arthroplasty.  She currently denies any headache, chest pain, shortness of breath  HPI  Review of Systems , Fever, chills, nausea, vomiting.  Objective: Vital Signs: There were no vitals taken for this visit.  Physical Exam She is alert and oriented x3 and in no acute distress Ortho Exam Her right knee is very tender to her.  There is valgus malalignment.  She has patellofemoral crepitation good range of motion but there is severe pain on stressing the knee and range of motion. Specialty Comments:  No specialty comments available.  Imaging: Xr Knee 1-2 Views Right  Result Date: 10/11/2017 2 views of the right knee to valgus malalignment with tricompartmental arthritic changes throughout the knee.  There are sclerotic changes as well as particular osteophytes and joint space narrowing.    PMFS History: Patient Active Problem List   Diagnosis Date Noted  . Unilateral primary osteoarthritis, right knee 10/11/2017  . S/P right knee arthroscopy 05/05/2017  . Ingrown toenail 04/12/2017  . Arthritis of right knee 01/27/2017  . Localization-related idiopathic epilepsy and epileptic syndromes with seizures of localized onset, not intractable,  without status epilepticus ([place]) 09/09/2016  . Injury of left ankle and foot 07/13/2016  . 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 Essential hypertension 05/17/2016  . Right knee pain 04/23/2016  . Ankle sprain 04/01/2016  . Low back pain 01/27/2016  . Faintness 01/27/2016  . Transient alteration of awareness 01/27/2016  . Poison ivy dermatitis 03/11/2015  . 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 Epilepsy ([place]) 02/24/2015   Past Medical History:  Diagnosis Date  . Anxiety   .  Arthritis   . Chicken pox   . Depression   . Epilepsy ([place])    controlled by Dilantin- last seizure 1995  . GERD (gastroesophageal reflux disease)   . 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 HTN (hypertension)   . Migraine   . Pneumonia   . Seasonal allergies   . Seizures ([place])   . Stroke [facility])    2017    Family History  Problem Relation Age of Onset  . Alcoholism Father   . Lung cancer Father        Smoker  . Stroke Father   . High blood pressure Father   . Breast cancer Maternal Grandmother   . Heart disease Mother   . High blood pressure Mother   . Depression Mother   . Seizures Mother   . Colon cancer Neg Hx   . Esophageal cancer Neg Hx   . Stomach cancer Neg Hx   . Rectal cancer Neg Hx     Past Surgical History:  Procedure Laterality Date  . ABDOMINAL HYSTERECTOMY    . APPENDECTOMY     with hysterectomy  . CATARACT EXTRACTION Bilateral   . IR ANGIO INTRA EXTRACRAN SEL COM CAROTID INNOMINATE BILAT MOD SED  07/18/2017  . IR ANGIO VERTEBRAL SEL VERTEBRAL BILAT MOD SED  07/18/2017  . IR RADIOLOGIST EVAL & MGMT  06/29/2017  . IR RADIOLOGIST EVAL & MGMT  07/27/2017  . IR RADIOLOGIST EVAL & MGMT  09/23/2017  . KNEE ARTHROSCOPY     RIGHT  . LYMPHADENECTOMY     Benign, age 12  . RADIOLOGY WITH ANESTHESIA N/A 07/18/2017   Procedure: EMBOLIZATION;  Surgeon: Luanne Bras, MD;  Location: [facility];  Service: Radiology;  Laterality: N/A;  . RADIOLOGY WITH ANESTHESIA N/A 07/21/2017   Procedure: MRI BRAIN WITHOUT;  Surgeon: Radiologist, Medication, MD;  Location: [facility];  Service: Radiology;  Laterality: N/A;  . TONSILLECTOMY AND ADENOIDECTOMY     Social History   Occupational History  . Occupation: Retired    Comment: retired  Tobacco Use  . Smoking status: Never Smoker  . Smokeless tobacco: Never Used  Substance and Sexual Activity  . Alcohol use: Yes    Alcohol/week: 0.0 oz    Comment: Occ  . Drug use: No  . Sexual activity: Not on file

## 2017-10-19 ENCOUNTER — Encounter (INDEPENDENT_AMBULATORY_CARE_PROVIDER_SITE_OTHER): Payer: Self-pay | Admitting: Orthopaedic Surgery

## 2017-10-20 ENCOUNTER — Other Ambulatory Visit (INDEPENDENT_AMBULATORY_CARE_PROVIDER_SITE_OTHER): Payer: Self-pay | Admitting: Orthopaedic Surgery

## 2017-10-20 MED ORDER — GABAPENTIN 300 MG PO CAPS: 300.0000 mg | ORAL_CAPSULE | Freq: Three times a day (TID) | ORAL | 1 refills | Status: DC

## 2017-10-25 ENCOUNTER — Encounter (INDEPENDENT_AMBULATORY_CARE_PROVIDER_SITE_OTHER): Payer: Self-pay | Admitting: Orthopaedic Surgery

## 2017-10-26 ENCOUNTER — Other Ambulatory Visit (INDEPENDENT_AMBULATORY_CARE_PROVIDER_SITE_OTHER): Payer: Self-pay | Admitting: Orthopaedic Surgery

## 2017-10-26 MED ORDER — GABAPENTIN 300 MG PO CAPS: 300.0000 mg | ORAL_CAPSULE | Freq: Three times a day (TID) | ORAL | 1 refills | Status: DC

## 2017-11-01 ENCOUNTER — Other Ambulatory Visit (INDEPENDENT_AMBULATORY_CARE_PROVIDER_SITE_OTHER): Payer: Self-pay | Admitting: Physician Assistant

## 2017-11-04 NOTE — Patient Instructions (Addendum)
Allison Bass  11/04/2017   Your procedure is scheduled on: 11-11-17   Report to [facility] Main  Entrance Report to Admitting at 6:35 AM   Call this number if you have problems the morning of surgery [phone]   Remember: Do not eat food or drink liquids :After Midnight.     Take these medicines the morning of surgery with A SIP OF WATER: Desvenlafaxine (Pristiq), and Dilantin ER, Alprazolam (Xanax), Atorvastatin (Lipitor), and Gabapentin (Neuontin)                                You may not have any metal on your body including hair pins and              piercings  Do not wear jewelry, make-up, lotions, powders or perfumes, deodorant             Do not wear nail polish.  Do not shave  48 hours prior to surgery.                Do not bring valuables to the hospital. [place].  Contacts, dentures or bridgework may not be worn into surgery.  Leave suitcase in the car. After surgery it may be brought to your room.               Please read over the following fact sheets you were given: _____________________________________________________________________             [facility] - Preparing for Surgery Before surgery, you can play an important role.  Because skin is not sterile, your skin needs to be as free of germs as possible.  You can reduce the number of germs on your skin by washing with CHG (chlorahexidine gluconate) soap before surgery.  CHG is an antiseptic cleaner which kills germs and bonds with the skin to continue killing germs even after washing. Please DO NOT use if you have an allergy to CHG or antibacterial soaps.  If your skin becomes reddened/irritated stop using the CHG and inform your nurse when you arrive at Short Stay. Do not shave (including legs and underarms) for at least 48 hours prior to the first CHG shower.  You may shave your face/neck. Please follow these instructions  carefully:  1.  Shower with CHG Soap the night before surgery and the  morning of Surgery.  2.  If you choose to wash your hair, wash your hair first as usual with your  normal  shampoo.  3.  After you shampoo, rinse your hair and body thoroughly to remove the  shampoo.                           4.  Use CHG as you would any other liquid soap.  You can apply chg directly  to the skin and wash                       Gently with a scrungie or clean washcloth.  5.  Apply the CHG Soap to your body ONLY FROM THE NECK DOWN.   Do not use on face/ open  Wound or open sores. Avoid contact with eyes, ears mouth and genitals (private parts).                       Wash face,  Genitals (private parts) with your normal soap.             6.  Wash thoroughly, paying special attention to the area where your surgery  will be performed.  7.  Thoroughly rinse your body with warm water from the neck down.  8.  DO NOT shower/wash with your normal soap after using and rinsing off  the CHG Soap.                9.  Pat yourself dry with a clean towel.            10.  Wear clean pajamas.            11.  Place clean sheets on your bed the night of your first shower and do not  sleep with pets. Day of Surgery : Do not apply any lotions/deodorants the morning of surgery.  Please wear clean clothes to the hospital/surgery center.  FAILURE TO FOLLOW THESE INSTRUCTIONS MAY RESULT IN THE CANCELLATION OF YOUR SURGERY PATIENT SIGNATURE_________________________________  NURSE SIGNATURE__________________________________  ________________________________________________________________________   Adam Phenix  An incentive spirometer is a tool that can help keep your lungs clear and active. This tool measures how well you are filling your lungs with each breath. Taking long deep breaths may help reverse or decrease the chance of developing breathing (pulmonary) problems (especially infection)  following:  A long period of time when you are unable to move or be active. BEFORE THE PROCEDURE   If the spirometer includes an indicator to show your best effort, your nurse or respiratory therapist will set it to a desired goal.  If possible, sit up straight or lean slightly forward. Try not to slouch.  Hold the incentive spirometer in an upright position. INSTRUCTIONS FOR USE  1. Sit on the edge of your bed if possible, or sit up as far as you can in bed or on a chair. 2. Hold the incentive spirometer in an upright position. 3. Breathe out normally. 4. Place the mouthpiece in your mouth and seal your lips tightly around it. 5. Breathe in slowly and as deeply as possible, raising the piston or the ball toward the top of the column. 6. Hold your breath for 3-5 seconds or for as long as possible. Allow the piston or ball to fall to the bottom of the column. 7. Remove the mouthpiece from your mouth and breathe out normally. 8. Rest for a few seconds and repeat Steps 1 through 7 at least 10 times every 1-2 hours when you are awake. Take your time and take a few normal breaths between deep breaths. 9. The spirometer may include an indicator to show your best effort. Use the indicator as a goal to work toward during each repetition. 10. After each set of 10 deep breaths, practice coughing to be sure your lungs are clear. If you have an incision (the cut made at the time of surgery), support your incision when coughing by placing a pillow or rolled up towels firmly against it. Once you are able to get out of bed, walk around indoors and cough well. You may stop using the incentive spirometer when instructed by your caregiver.  RISKS AND COMPLICATIONS  Take your time so you do not get  dizzy or light-headed.  If you are in pain, you may need to take or ask for pain medication before doing incentive spirometry. It is harder to take a deep breath if you are having pain. AFTER USE  Rest and  breathe slowly and easily.  It can be helpful to keep track of a log of your progress. Your caregiver can provide you with a simple table to help with this. If you are using the spirometer at home, follow these instructions: [place] IF:   You are having difficultly using the spirometer.  You have trouble using the spirometer as often as instructed.  Your pain medication is not giving enough relief while using the spirometer.  You develop fever of 100.5 F (38.1 C) or higher. SEEK IMMEDIATE MEDICAL CARE IF:   You cough up bloody sputum that had not been present before.  You develop fever of 102 F (38.9 C) or greater.  You develop worsening pain at or near the incision site. MAKE SURE YOU:   Understand these instructions.  Will watch your condition.  Will get help right away if you are not doing well or get worse. Document Released: 02/21/2007 Document Revised: 01/03/2012 Document Reviewed: 04/24/2007 [facility] Patient Information 2014 [place], [place].   ________________________________________________________________________

## 2017-11-08 ENCOUNTER — Other Ambulatory Visit: Payer: Self-pay

## 2017-11-08 ENCOUNTER — Encounter (HOSPITAL_COMMUNITY): Payer: Self-pay

## 2017-11-08 ENCOUNTER — Encounter (HOSPITAL_COMMUNITY)
Admission: RE | Admit: 2017-11-08 | Discharge: 2017-11-08 | Disposition: A | Discharge status: Home / Self Care | Payer: Medicare Other | Source: Ambulatory Visit | Attending: Orthopaedic Surgery | Admitting: Orthopaedic Surgery

## 2017-11-08 DIAGNOSIS — Z01812 Encounter for preprocedural laboratory examination: Secondary | ICD-10-CM | POA: Insufficient documentation

## 2017-11-08 LAB — CBC
HEMATOCRIT: 37.6 % (ref 36.0–46.0)
HEMOGLOBIN: 13 g/dL (ref 12.0–15.0)
MCH: 31.6 pg (ref 26.0–34.0)
MCHC: 34.6 g/dL (ref 30.0–36.0)
MCV: 91.3 fL (ref 78.0–100.0)
Platelets: 217 10*3/uL (ref 150–400)
RBC: 4.12 MIL/uL (ref 3.87–5.11)
RDW: 13.2 % (ref 11.5–15.5)
WBC: 7.5 10*3/uL (ref 4.0–10.5)

## 2017-11-08 LAB — SURGICAL PCR SCREEN
MRSA, PCR: NEGATIVE
STAPHYLOCOCCUS AUREUS: POSITIVE — AB

## 2017-11-08 LAB — BASIC METABOLIC PANEL
ANION GAP: 8 (ref 5–15)
BUN: 13 mg/dL (ref 6–20)
CO2: 27 mmol/L (ref 22–32)
Calcium: 8.6 mg/dL — ABNORMAL LOW (ref 8.9–10.3)
Chloride: 95 mmol/L — ABNORMAL LOW (ref 101–111)
Creatinine, Ser: 0.92 mg/dL (ref 0.44–1.00)
GFR calc non Af Amer: 60 mL/min — ABNORMAL LOW (ref >60)
GLUCOSE: 83 mg/dL (ref 65–99)
POTASSIUM: 3.4 mmol/L — AB (ref 3.5–5.1)
SODIUM: 130 mmol/L — AB (ref 135–145)

## 2017-11-08 NOTE — Progress Notes (Addendum)
07-15-17 (Epic) EKG

## 2017-11-09 NOTE — Progress Notes (Signed)
11-08-17 PCR result routed to Dr. Ninfa Linden for review.

## 2017-11-09 NOTE — Progress Notes (Signed)
11-09-17 Spoke to Dr. Estanislado Pandy regarding pt's aneurysm. Per Dr. Estanislado Pandy, he last saw pt on 09-23-17. 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 They agreed to f/u with patient after her knee surgery. Pt to return to office either in February or March. (See 09-23-17 office note under IR Radiologist Evaluation and Management).

## 2017-11-10 ENCOUNTER — Other Ambulatory Visit: Payer: Self-pay | Admitting: Orthopaedic Surgery

## 2017-11-10 NOTE — Anesthesia Preprocedure Evaluation (Addendum)
Anesthesia Evaluation  Patient identified by MRN, date of birth, ID band Patient awake    Reviewed: Allergy & Precautions, NPO status , Patient's Chart, lab work & pertinent test results  Airway Mallampati: II  TM Distance: >3 FB Neck ROM: Full    Dental no notable dental hx. (+) Edentulous Upper, Edentulous Lower, Dental Advisory Given   Pulmonary pneumonia,    Pulmonary exam normal breath sounds clear to auscultation       Cardiovascular hypertension, Pt. 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 ([facility]) Essential hypertension  Localization-related idiopathic epilepsy and epileptic syndromes with seizures of localized onset, not intractable, without status epilepticus ([place])       Reproductive/Obstetrics negative OB ROS                             Anesthesia Physical  Anesthesia Plan  ASA: III  Anesthesia Plan: Spinal   Post-op Pain Management:  [facility] for Post-op pain   Induction: Intravenous  PONV Risk Score and Plan: 2 and Treatment may vary due to age or medical condition and TIVA  Airway Management Planned: Nasal Cannula and Natural Airway  Additional Equipment: Arterial line  Intra-op Plan:   Post-operative Plan:   Informed Consent: I have reviewed the patients  History and Physical, chart, labs and discussed the procedure including the risks, benefits and alternatives for the proposed anesthesia with the patient or authorized representative who has indicated his/her understanding and acceptance.   Dental advisory given  Plan Discussed with: CRNA and Anesthesiologist  Anesthesia Plan Comments: (  )       Anesthesia Quick Evaluation

## 2017-11-11 ENCOUNTER — Other Ambulatory Visit: Payer: Self-pay

## 2017-11-11 ENCOUNTER — Ambulatory Visit (HOSPITAL_COMMUNITY): Payer: Medicare Other | Admitting: Anesthesiology

## 2017-11-11 ENCOUNTER — Inpatient Hospital Stay (HOSPITAL_COMMUNITY): Payer: Medicare Other

## 2017-11-11 ENCOUNTER — Encounter (HOSPITAL_COMMUNITY): Payer: Self-pay | Admitting: Certified Registered Nurse Anesthetist

## 2017-11-11 ENCOUNTER — Inpatient Hospital Stay (HOSPITAL_COMMUNITY)
Admission: RE | Admit: 2017-11-11 | Discharge: 2017-11-14 | DRG: 470 | Disposition: A | Discharge status: Skilled Nursing Facility | Payer: Medicare Other | Source: Ambulatory Visit | Attending: Orthopaedic Surgery | Admitting: Orthopaedic Surgery

## 2017-11-11 ENCOUNTER — Encounter (HOSPITAL_COMMUNITY)
Admission: RE | Disposition: A | Discharge status: Skilled Nursing Facility | Payer: Self-pay | Source: Ambulatory Visit | Attending: Orthopaedic Surgery

## 2017-11-11 DIAGNOSIS — K219 Gastro-esophageal reflux disease without esophagitis: Secondary | ICD-10-CM | POA: Diagnosis present

## 2017-11-11 DIAGNOSIS — D62 Acute posthemorrhagic anemia: Secondary | ICD-10-CM | POA: Diagnosis not present

## 2017-11-11 DIAGNOSIS — Z8673 Personal history of transient ischemic attack (TIA), and cerebral infarction without residual deficits: Secondary | ICD-10-CM

## 2017-11-11 DIAGNOSIS — I150 Renovascular hypertension: Secondary | ICD-10-CM | POA: Diagnosis present

## 2017-11-11 DIAGNOSIS — E871 Hypo-osmolality and hyponatremia: Secondary | ICD-10-CM | POA: Diagnosis present

## 2017-11-11 DIAGNOSIS — Z7982 Long term (current) use of aspirin: Secondary | ICD-10-CM | POA: Diagnosis not present

## 2017-11-11 DIAGNOSIS — G40009 Localization-related (focal) (partial) idiopathic epilepsy and epileptic syndromes with seizures of localized onset, not intractable, without status epilepticus: Secondary | ICD-10-CM | POA: Diagnosis present

## 2017-11-11 DIAGNOSIS — M1990 Unspecified osteoarthritis, unspecified site: Secondary | ICD-10-CM | POA: Diagnosis not present

## 2017-11-11 DIAGNOSIS — M1711 Unilateral primary osteoarthritis, right knee: Principal | ICD-10-CM | POA: Diagnosis present

## 2017-11-11 DIAGNOSIS — Z01812 Encounter for preprocedural laboratory examination: Secondary | ICD-10-CM | POA: Diagnosis not present

## 2017-11-11 DIAGNOSIS — G8911 Acute pain due to trauma: Secondary | ICD-10-CM | POA: Diagnosis not present

## 2017-11-11 DIAGNOSIS — G40909 Epilepsy, unspecified, not intractable, without status epilepticus: Secondary | ICD-10-CM | POA: Diagnosis not present

## 2017-11-11 DIAGNOSIS — M25561 Pain in right knee: Secondary | ICD-10-CM | POA: Diagnosis not present

## 2017-11-11 DIAGNOSIS — F419 Anxiety disorder, unspecified: Secondary | ICD-10-CM | POA: Diagnosis not present

## 2017-11-11 DIAGNOSIS — Z79890 Hormone replacement therapy (postmenopausal): Secondary | ICD-10-CM

## 2017-11-11 DIAGNOSIS — I10 Essential (primary) hypertension: Secondary | ICD-10-CM | POA: Diagnosis not present

## 2017-11-11 DIAGNOSIS — F329 Major depressive disorder, single episode, unspecified: Secondary | ICD-10-CM | POA: Diagnosis not present

## 2017-11-11 DIAGNOSIS — Z471 Aftercare following joint replacement surgery: Secondary | ICD-10-CM | POA: Diagnosis not present

## 2017-11-11 DIAGNOSIS — Z885 Allergy status to narcotic agent status: Secondary | ICD-10-CM

## 2017-11-11 DIAGNOSIS — Z96651 Presence of right artificial knee joint: Secondary | ICD-10-CM

## 2017-11-11 DIAGNOSIS — Z881 Allergy status to other antibiotic agents status: Secondary | ICD-10-CM | POA: Diagnosis not present

## 2017-11-11 DIAGNOSIS — Q249 Congenital malformation of heart, unspecified: Secondary | ICD-10-CM | POA: Diagnosis not present

## 2017-11-11 DIAGNOSIS — G8918 Other acute postprocedural pain: Secondary | ICD-10-CM | POA: Diagnosis not present

## 2017-11-11 DIAGNOSIS — M21061 Valgus deformity, not elsewhere classified, right knee: Secondary | ICD-10-CM | POA: Diagnosis not present

## 2017-11-11 HISTORY — PX: TOTAL KNEE ARTHROPLASTY: SHX125

## 2017-11-11 SURGERY — ARTHROPLASTY, KNEE, TOTAL
Anesthesia: Spinal | Site: Knee | Laterality: Right

## 2017-11-11 MED ORDER — PROPOFOL 10 MG/ML IV BOLUS
INTRAVENOUS | Status: AC
  Filled 2017-11-11: qty 20

## 2017-11-11 MED ORDER — ASPIRIN EC 325 MG PO TBEC
325.0000 mg | DELAYED_RELEASE_TABLET | Freq: Two times a day (BID) | ORAL | Status: DC
  Administered 2017-11-12 – 2017-11-14 (×4): 325 mg via ORAL
  Filled 2017-11-11 (×4): qty 1

## 2017-11-11 MED ORDER — METOCLOPRAMIDE HCL 5 MG PO TABS: 5.0000 mg | ORAL_TABLET | Freq: Three times a day (TID) | ORAL | Status: DC | PRN

## 2017-11-11 MED ORDER — ROPIVACAINE HCL 7.5 MG/ML IJ SOLN
INTRAMUSCULAR | Status: DC | PRN
  Administered 2017-11-11: 30 mL via PERINEURAL

## 2017-11-11 MED ORDER — HYDROMORPHONE HCL 1 MG/ML IJ SOLN
INTRAMUSCULAR | Status: AC
  Filled 2017-11-11: qty 1

## 2017-11-11 MED ORDER — ALPRAZOLAM 0.5 MG PO TABS
0.5000 mg | ORAL_TABLET | Freq: Three times a day (TID) | ORAL | Status: DC | PRN
  Administered 2017-11-11 – 2017-11-14 (×4): 0.5 mg via ORAL
  Filled 2017-11-11 (×5): qty 1

## 2017-11-11 MED ORDER — PROPOFOL 500 MG/50ML IV EMUL
INTRAVENOUS | Status: DC | PRN
  Administered 2017-11-11: 100 ug/kg/min via INTRAVENOUS

## 2017-11-11 MED ORDER — MIDAZOLAM HCL 5 MG/5ML IJ SOLN
INTRAMUSCULAR | Status: DC | PRN
  Administered 2017-11-11: 2 mg via INTRAVENOUS

## 2017-11-11 MED ORDER — CHLORHEXIDINE GLUCONATE 4 % EX LIQD: 60.0000 mL | Freq: Once | CUTANEOUS | Status: DC

## 2017-11-11 MED ORDER — LACTATED RINGERS IV SOLN
INTRAVENOUS | Status: DC
  Administered 2017-11-11 (×2): via INTRAVENOUS

## 2017-11-11 MED ORDER — ONDANSETRON HCL 4 MG/2ML IJ SOLN
INTRAMUSCULAR | Status: DC | PRN
  Administered 2017-11-11: 4 mg via INTRAVENOUS

## 2017-11-11 MED ORDER — 0.9 % SODIUM CHLORIDE (POUR BTL) OPTIME
TOPICAL | Status: DC | PRN
  Administered 2017-11-11: 1000 mL

## 2017-11-11 MED ORDER — DEXAMETHASONE SODIUM PHOSPHATE 10 MG/ML IJ SOLN
INTRAMUSCULAR | Status: DC | PRN
  Administered 2017-11-11: 10 mg via INTRAVENOUS

## 2017-11-11 MED ORDER — HYDROMORPHONE HCL 1 MG/ML IJ SOLN
INTRAMUSCULAR | Status: AC
  Administered 2017-11-12: 1 mg
  Filled 2017-11-11: qty 1

## 2017-11-11 MED ORDER — PROPOFOL 10 MG/ML IV BOLUS
INTRAVENOUS | Status: AC
  Filled 2017-11-11: qty 60

## 2017-11-11 MED ORDER — HYDROMORPHONE HCL 1 MG/ML IJ SOLN
1.0000 mg | INTRAMUSCULAR | Status: DC | PRN
  Administered 2017-11-11 – 2017-11-12 (×3): 1 mg via INTRAVENOUS
  Filled 2017-11-11 (×4): qty 1

## 2017-11-11 MED ORDER — MENTHOL 3 MG MT LOZG: 1.0000 | LOZENGE | OROMUCOSAL | Status: DC | PRN

## 2017-11-11 MED ORDER — PHENOL 1.4 % MT LIQD: 1.0000 | OROMUCOSAL | Status: DC | PRN

## 2017-11-11 MED ORDER — METOCLOPRAMIDE HCL 5 MG/ML IJ SOLN: 5.0000 mg | Freq: Three times a day (TID) | INTRAMUSCULAR | Status: DC | PRN

## 2017-11-11 MED ORDER — HYDROCHLOROTHIAZIDE 25 MG PO TABS
50.0000 mg | ORAL_TABLET | Freq: Every day | ORAL | Status: DC
  Administered 2017-11-13: 50 mg via ORAL
  Filled 2017-11-11 (×2): qty 2

## 2017-11-11 MED ORDER — DEXTROSE 5 % IV SOLN
INTRAVENOUS | Status: DC | PRN
  Administered 2017-11-11: 50 ug/min via INTRAVENOUS

## 2017-11-11 MED ORDER — BUPIVACAINE IN DEXTROSE 0.75-8.25 % IT SOLN
INTRATHECAL | Status: DC | PRN
  Administered 2017-11-11: 1.8 mL via INTRATHECAL

## 2017-11-11 MED ORDER — PHENYTOIN SODIUM EXTENDED 100 MG PO CAPS
300.0000 mg | ORAL_CAPSULE | Freq: Every day | ORAL | Status: DC
  Administered 2017-11-12 – 2017-11-14 (×3): 300 mg via ORAL
  Filled 2017-11-11 (×3): qty 3

## 2017-11-11 MED ORDER — HYDROMORPHONE HCL 1 MG/ML IJ SOLN
0.2500 mg | INTRAMUSCULAR | Status: DC | PRN
  Administered 2017-11-11 (×4): 0.5 mg via INTRAVENOUS

## 2017-11-11 MED ORDER — SODIUM CHLORIDE 0.9 % IR SOLN
Status: DC | PRN
  Administered 2017-11-11: 2000 mL

## 2017-11-11 MED ORDER — LISINOPRIL 10 MG PO TABS
10.0000 mg | ORAL_TABLET | Freq: Every day | ORAL | Status: DC
  Administered 2017-11-13: 10:00:00 10 mg via ORAL
  Filled 2017-11-11 (×2): qty 1

## 2017-11-11 MED ORDER — ONDANSETRON HCL 4 MG PO TABS: 4.0000 mg | ORAL_TABLET | Freq: Four times a day (QID) | ORAL | Status: DC | PRN

## 2017-11-11 MED ORDER — ACETAMINOPHEN 650 MG RE SUPP: 650.0000 mg | RECTAL | Status: DC | PRN

## 2017-11-11 MED ORDER — METHOCARBAMOL 1000 MG/10ML IJ SOLN
500.0000 mg | Freq: Four times a day (QID) | INTRAMUSCULAR | Status: DC | PRN
  Administered 2017-11-11: 500 mg via INTRAVENOUS
  Filled 2017-11-11: qty 550

## 2017-11-11 MED ORDER — CEFAZOLIN SODIUM-DEXTROSE 2-4 GM/100ML-% IV SOLN
2.0000 g | INTRAVENOUS | Status: AC
  Administered 2017-11-11: 2 g via INTRAVENOUS
  Filled 2017-11-11: qty 100

## 2017-11-11 MED ORDER — DOCUSATE SODIUM 100 MG PO CAPS
100.0000 mg | ORAL_CAPSULE | Freq: Two times a day (BID) | ORAL | Status: DC
  Administered 2017-11-11 – 2017-11-14 (×6): 100 mg via ORAL
  Filled 2017-11-11 (×6): qty 1

## 2017-11-11 MED ORDER — ESTROGENS CONJUGATED 1.25 MG PO TABS
1.2500 mg | ORAL_TABLET | Freq: Every day | ORAL | Status: DC
  Administered 2017-11-12 – 2017-11-14 (×3): 1.25 mg via ORAL
  Filled 2017-11-11 (×3): qty 1

## 2017-11-11 MED ORDER — MIDAZOLAM HCL 2 MG/2ML IJ SOLN
1.0000 mg | INTRAMUSCULAR | Status: DC
  Administered 2017-11-11: 2 mg via INTRAVENOUS

## 2017-11-11 MED ORDER — POTASSIUM CHLORIDE ER 10 MEQ PO TBCR
20.0000 meq | EXTENDED_RELEASE_TABLET | Freq: Two times a day (BID) | ORAL | Status: DC
  Administered 2017-11-11 – 2017-11-12 (×2): 20 meq via ORAL
  Filled 2017-11-11 (×4): qty 2

## 2017-11-11 MED ORDER — POLYETHYLENE GLYCOL 3350 17 G PO PACK: 17.0000 g | PACK | Freq: Every day | ORAL | Status: DC | PRN

## 2017-11-11 MED ORDER — FENTANYL CITRATE (PF) 100 MCG/2ML IJ SOLN
INTRAMUSCULAR | Status: AC
  Administered 2017-11-11: 100 ug via INTRAVENOUS
  Filled 2017-11-11: qty 2

## 2017-11-11 MED ORDER — FENTANYL CITRATE (PF) 100 MCG/2ML IJ SOLN
50.0000 ug | INTRAMUSCULAR | Status: DC
  Administered 2017-11-11: 100 ug via INTRAVENOUS

## 2017-11-11 MED ORDER — PROPOFOL 10 MG/ML IV BOLUS
INTRAVENOUS | Status: DC | PRN
  Administered 2017-11-11: 50 mg via INTRAVENOUS

## 2017-11-11 MED ORDER — ONDANSETRON HCL 4 MG/2ML IJ SOLN
4.0000 mg | Freq: Four times a day (QID) | INTRAMUSCULAR | Status: DC | PRN
  Administered 2017-11-12: 11:00:00 4 mg via INTRAVENOUS
  Filled 2017-11-11: qty 2

## 2017-11-11 MED ORDER — DIPHENHYDRAMINE HCL 12.5 MG/5ML PO ELIX: 12.5000 mg | ORAL_SOLUTION | ORAL | Status: DC | PRN

## 2017-11-11 MED ORDER — HYDROCODONE-ACETAMINOPHEN 5-325 MG PO TABS
1.0000 | ORAL_TABLET | ORAL | Status: DC | PRN
  Administered 2017-11-11 – 2017-11-13 (×10): 2 via ORAL
  Administered 2017-11-14: 15:00:00 1 via ORAL
  Administered 2017-11-14: 08:00:00 2 via ORAL
  Filled 2017-11-11: qty 1
  Filled 2017-11-11 (×11): qty 2

## 2017-11-11 MED ORDER — PHENYLEPHRINE 40 MCG/ML (10ML) SYRINGE FOR IV PUSH (FOR BLOOD PRESSURE SUPPORT)
PREFILLED_SYRINGE | INTRAVENOUS | Status: DC | PRN
  Administered 2017-11-11: 160 ug via INTRAVENOUS
  Administered 2017-11-11: 120 ug via INTRAVENOUS

## 2017-11-11 MED ORDER — FENTANYL CITRATE (PF) 100 MCG/2ML IJ SOLN
INTRAMUSCULAR | Status: AC
  Filled 2017-11-11: qty 2

## 2017-11-11 MED ORDER — MEPERIDINE HCL 50 MG/ML IJ SOLN: 6.2500 mg | INTRAMUSCULAR | Status: DC | PRN

## 2017-11-11 MED ORDER — VENLAFAXINE HCL ER 150 MG PO CP24
150.0000 mg | ORAL_CAPSULE | Freq: Every day | ORAL | Status: DC
  Administered 2017-11-12 – 2017-11-14 (×3): 150 mg via ORAL
  Filled 2017-11-11 (×3): qty 1

## 2017-11-11 MED ORDER — SODIUM CHLORIDE 0.9 % IV SOLN
INTRAVENOUS | Status: DC
  Administered 2017-11-11 – 2017-11-12 (×2): via INTRAVENOUS

## 2017-11-11 MED ORDER — METHOCARBAMOL 500 MG PO TABS
500.0000 mg | ORAL_TABLET | Freq: Four times a day (QID) | ORAL | Status: DC | PRN
  Administered 2017-11-11 – 2017-11-13 (×5): 500 mg via ORAL
  Filled 2017-11-11 (×5): qty 1

## 2017-11-11 MED ORDER — ATORVASTATIN CALCIUM 40 MG PO TABS
40.0000 mg | ORAL_TABLET | Freq: Every day | ORAL | Status: DC
  Administered 2017-11-12 – 2017-11-14 (×3): 40 mg via ORAL
  Filled 2017-11-11 (×3): qty 1

## 2017-11-11 MED ORDER — FENTANYL CITRATE (PF) 100 MCG/2ML IJ SOLN
25.0000 ug | INTRAMUSCULAR | Status: DC | PRN
  Administered 2017-11-11 (×2): 50 ug via INTRAVENOUS

## 2017-11-11 MED ORDER — MIDAZOLAM HCL 2 MG/2ML IJ SOLN
INTRAMUSCULAR | Status: AC
  Filled 2017-11-11: qty 2

## 2017-11-11 MED ORDER — MIDAZOLAM HCL 2 MG/2ML IJ SOLN
INTRAMUSCULAR | Status: AC
  Administered 2017-11-11: 2 mg via INTRAVENOUS
  Filled 2017-11-11: qty 2

## 2017-11-11 MED ORDER — ACETAMINOPHEN 325 MG PO TABS
650.0000 mg | ORAL_TABLET | ORAL | Status: DC | PRN
  Administered 2017-11-13: 650 mg via ORAL
  Filled 2017-11-11: qty 2

## 2017-11-11 MED ORDER — CEFAZOLIN SODIUM-DEXTROSE 1-4 GM/50ML-% IV SOLN
1.0000 g | Freq: Four times a day (QID) | INTRAVENOUS | Status: AC
  Administered 2017-11-11 (×2): 1 g via INTRAVENOUS
  Filled 2017-11-11 (×2): qty 50

## 2017-11-11 MED ORDER — OXYCODONE HCL 5 MG PO TABS
10.0000 mg | ORAL_TABLET | ORAL | Status: DC | PRN
  Administered 2017-11-11 – 2017-11-13 (×3): 10 mg via ORAL
  Filled 2017-11-11 (×3): qty 2

## 2017-11-11 MED ORDER — DEXAMETHASONE SODIUM PHOSPHATE 10 MG/ML IJ SOLN
INTRAMUSCULAR | Status: AC
  Filled 2017-11-11: qty 1

## 2017-11-11 MED ORDER — STERILE WATER FOR IRRIGATION IR SOLN
Status: DC | PRN
Start: 2017-11-11 — End: 2017-11-11
  Administered 2017-11-11: 2000 mL

## 2017-11-11 MED ORDER — ALUM & MAG HYDROXIDE-SIMETH 200-200-20 MG/5ML PO SUSP
30.0000 mL | ORAL | Status: DC | PRN
  Administered 2017-11-11: 30 mL via ORAL
  Filled 2017-11-11: qty 30

## 2017-11-11 MED ORDER — GABAPENTIN 300 MG PO CAPS
600.0000 mg | ORAL_CAPSULE | Freq: Two times a day (BID) | ORAL | Status: DC
  Administered 2017-11-11 – 2017-11-14 (×6): 600 mg via ORAL
  Filled 2017-11-11 (×6): qty 2

## 2017-11-11 SURGICAL SUPPLY — 46 items
BAG ZIPLOCK 12X15 (MISCELLANEOUS) IMPLANT
BANDAGE ACE 6X5 VEL STRL LF (GAUZE/BANDAGES/DRESSINGS) ×3 IMPLANT
BENZOIN TINCTURE PRP APPL 2/3 (GAUZE/BANDAGES/DRESSINGS) IMPLANT
BLADE SAG 18X100X1.27 (BLADE) IMPLANT
BOWL SMART MIX CTS (DISPOSABLE) IMPLANT
CAPT KNEE TOTAL 3 ×3 IMPLANT
CEMENT BONE SIMPLEX SPEEDSET (Cement) ×6 IMPLANT
CLOSURE WOUND 1/2 X4 (GAUZE/BANDAGES/DRESSINGS) ×1
COVER SURGICAL LIGHT HANDLE (MISCELLANEOUS) ×3 IMPLANT
CUFF TOURN SGL QUICK 34 (TOURNIQUET CUFF) ×2
CUFF TRNQT CYL 34X4X40X1 (TOURNIQUET CUFF) ×1 IMPLANT
DRAPE U-SHAPE 47X51 STRL (DRAPES) ×3 IMPLANT
DRSG AQUACEL AG ADV 3.5X10 (GAUZE/BANDAGES/DRESSINGS) IMPLANT
DRSG PAD ABDOMINAL 8X10 ST (GAUZE/BANDAGES/DRESSINGS) ×3 IMPLANT
DURAPREP 26ML APPLICATOR (WOUND CARE) ×3 IMPLANT
ELECT REM PT RETURN 15FT ADLT (MISCELLANEOUS) ×3 IMPLANT
GAUZE SPONGE 4X4 12PLY STRL (GAUZE/BANDAGES/DRESSINGS) ×3 IMPLANT
GAUZE XEROFORM 1X8 LF (GAUZE/BANDAGES/DRESSINGS) IMPLANT
GLOVE BIO SURGEON STRL SZ7.5 (GLOVE) ×3 IMPLANT
GLOVE BIOGEL PI IND STRL 8 (GLOVE) ×2 IMPLANT
GLOVE BIOGEL PI INDICATOR 8 (GLOVE) ×4
GLOVE ECLIPSE 8.0 STRL XLNG CF (GLOVE) ×3 IMPLANT
GOWN STRL REUS W/TWL XL LVL3 (GOWN DISPOSABLE) ×6 IMPLANT
HANDPIECE INTERPULSE COAX TIP (DISPOSABLE) ×2
IMMOBILIZER KNEE 20 (SOFTGOODS) ×3
IMMOBILIZER KNEE 20 THIGH 36 (SOFTGOODS) ×1 IMPLANT
NS IRRIG 1000ML POUR BTL (IV SOLUTION) ×3 IMPLANT
PACK TOTAL KNEE CUSTOM (KITS) ×3 IMPLANT
PADDING CAST COTTON 6X4 STRL (CAST SUPPLIES) ×6 IMPLANT
POSITIONER SURGICAL ARM (MISCELLANEOUS) ×3 IMPLANT
SET HNDPC FAN SPRY TIP SCT (DISPOSABLE) ×1 IMPLANT
SET PAD KNEE POSITIONER (MISCELLANEOUS) ×3 IMPLANT
STAPLER VISISTAT 35W (STAPLE) IMPLANT
STRIP CLOSURE SKIN 1/2X4 (GAUZE/BANDAGES/DRESSINGS) ×2 IMPLANT
SUT MNCRL AB 4-0 PS2 18 (SUTURE) ×3 IMPLANT
SUT VIC AB 0 CT1 27 (SUTURE) ×2
SUT VIC AB 0 CT1 27XBRD ANTBC (SUTURE) ×1 IMPLANT
SUT VIC AB 1 CT1 27 (SUTURE) ×4
SUT VIC AB 1 CT1 27XBRD ANTBC (SUTURE) ×2 IMPLANT
SUT VIC AB 2-0 CT1 27 (SUTURE) ×4
SUT VIC AB 2-0 CT1 TAPERPNT 27 (SUTURE) ×2 IMPLANT
TRAY FOLEY CATH 14FRSI W/METER (CATHETERS) ×3 IMPLANT
TRAY FOLEY W/METER SILVER 16FR (SET/KITS/TRAYS/PACK) IMPLANT
WATER STERILE IRR 1000ML POUR (IV SOLUTION) ×3 IMPLANT
WRAP KNEE MAXI GEL POST OP (GAUZE/BANDAGES/DRESSINGS) ×3 IMPLANT
YANKAUER SUCT BULB TIP 10FT TU (MISCELLANEOUS) ×3 IMPLANT

## 2017-11-11 NOTE — Evaluation (Signed)
Physical Therapy Evaluation Patient Details Name: Allison Bass MRN: [id number] DOB: 06-05-1941 Today's Date: 11/11/2017   History of Present Illness  Pt s/p R TKR and with hx of CVA and sz  Clinical Impression  Pt s/p R TKR and presents with decreased R LE strength/ROM and post op pain limiting functional mobility.  Pt hopes to progress to dc home with assist of family/friends and states she can arrange 24/7 initially.      Follow Up Recommendations Home health PT;DC plan and follow up therapy as arranged by surgeon    Equipment Recommendations  None recommended by PT    Recommendations for Other Services OT consult     Precautions / Restrictions Precautions Precautions: Knee;Fall Required Braces or Orthoses: Knee Immobilizer - Right Knee Immobilizer - Right: Discontinue once straight leg raise with < 10 degree lag Restrictions Weight Bearing Restrictions: No Other Position/Activity Restrictions: WBAT      Mobility  Bed Mobility Overal bed mobility: Needs Assistance Bed Mobility: Supine to Sit;Sit to Supine     Supine to sit: Min assist;+2 for physical assistance;+2 for safety/equipment Sit to supine: +2 for physical assistance;Total assist   General bed mobility comments: cues for sequence and use of L LE to self assist  Transfers Overall transfer level: Needs assistance Equipment used: Rolling walker (2 wheeled) Transfers: Sit to/from Stand Sit to Stand: Mod assist;+2 physical assistance;+2 safety/equipment         General transfer comment: cues for LE management and use of UEs to self assist  Ambulation/Gait Ambulation/Gait assistance: Mod assist;+2 physical assistance;+2 safety/equipment Ambulation Distance (Feet): 2 Feet Assistive device: Rolling walker (2 wheeled) Gait Pattern/deviations: Step-to pattern;Decreased step length - right;Decreased step length - left;Shuffle;Trunk flexed Gait velocity: decr Gait velocity interpretation: Below normal  speed for age/gender General Gait Details: cues for sequence, posture and position from RW; distance ltd by c/o dizziness - pt becoming unresponsive and returned to bed with mod assist of two  Stairs            Wheelchair Mobility    Modified Rankin (Stroke Patients Only)       Balance                                             Pertinent Vitals/Pain Pain Assessment: 0-10 Pain Score: 8  Pain Location: R knee Pain Descriptors / Indicators: Aching;Sore;Grimacing Pain Intervention(s): Limited activity within patient's tolerance;Monitored during session;Premedicated before session;Ice applied;Patient requesting pain meds-RN notified    Home Living Family/patient expects to be discharged to:: Private residence Living Arrangements: Alone Available Help at Discharge: Family;Friend(s);Available 24 hours/day Type of Home: House Home Access: Stairs to enter Entrance Stairs-Rails: Right Entrance Stairs-Number of Steps: 2 Home Layout: One level Home Equipment: Walker - 2 wheels;Cane - single point      Prior Function Level of Independence: Independent with assistive device(s)         Comments: using RW 2* knee pain     Hand Dominance        Extremity/Trunk Assessment   Upper Extremity Assessment Upper Extremity Assessment: Overall WFL for tasks assessed    Lower Extremity Assessment Lower Extremity Assessment: RLE deficits/detail    Cervical / Trunk Assessment Cervical / Trunk Assessment: Normal  Communication   Communication: No difficulties  Cognition Arousal/Alertness: Awake/alert Behavior During Therapy: WFL for tasks assessed/performed Overall Cognitive Status: Within Functional Limits  for tasks assessed                                 General Comments: Pt passed out with attempt to ambulate      General Comments      Exercises Total Joint Exercises Ankle Circles/Pumps: AROM;Both;15 reps;Supine    Assessment/Plan    PT Assessment Patient needs continued PT services  PT Problem List Decreased strength;Decreased range of motion;Decreased activity tolerance;Decreased mobility;Decreased knowledge of use of DME;Pain       PT Treatment Interventions DME instruction;Gait training;Stair training;Functional mobility training;Therapeutic activities;Therapeutic exercise;Patient/family education    PT Goals (Current goals can be found in the Care Plan section)  Acute Rehab PT Goals Patient Stated Goal: Regain IND and have less pain PT Goal Formulation: With patient Time For Goal Achievement: 11/18/17 Potential to Achieve Goals: Good    Frequency 7X/week   Barriers to discharge        Co-evaluation               AM-PAC PT "6 Clicks" Daily Activity  Outcome Measure Difficulty turning over in bed (including adjusting bedclothes, sheets and blankets)?: Unable Difficulty moving from lying on back to sitting on the side of the bed? : Unable Difficulty sitting down on and standing up from a chair with arms (e.g., wheelchair, bedside commode, etc,.)?: Unable Help needed moving to and from a bed to chair (including a wheelchair)?: A Lot Help needed walking in hospital room?: A Lot Help needed climbing 3-5 steps with a railing? : Total 6 Click Score: 8    End of Session Equipment Utilized During Treatment: Gait belt;Right knee immobilizer Activity Tolerance: Other (comment)(Passed out with attempt to ambulate) Patient left: in bed;with call bell/phone within reach;with family/visitor present;with nursing/sitter in room Nurse Communication: Mobility status PT Visit Diagnosis: Difficulty in walking, not elsewhere classified (R26.2)    Time: 5830-9407 PT Time Calculation (min) (ACUTE ONLY): 34 min   Charges:   PT Evaluation $PT Eval Low Complexity: 1 Low PT Treatments $Therapeutic Activity: 8-22 mins   PT G Codes:        Pg [phone]   Adelena Desantiago 11/11/2017,  5:56 PM

## 2017-11-11 NOTE — Anesthesia Procedure Notes (Signed)
Spinal  Patient location during procedure: OR Start time: 11/11/2017 8:42 AM Staffing Anesthesiologist: Janeece Riggers, MD Resident/CRNA: [place] Worth Kober C, CRNA Performed: resident/CRNA  Preanesthetic Checklist Completed: patient identified, site marked, surgical consent, pre-op evaluation, timeout performed, IV checked, risks and benefits discussed and monitors and equipment checked Spinal Block Patient position: sitting Prep: site prepped and draped and DuraPrep Patient monitoring: heart rate, continuous pulse ox and blood pressure Approach: midline Location: L3-4 Injection technique: single-shot Needle Needle type: Pencan  Needle gauge: 24 G Needle length: 9 cm Assessment Sensory level: T6 Additional Notes Expiration date of kit checked and confirmed. Patient tolerated procedure well, without complications.

## 2017-11-11 NOTE — Progress Notes (Signed)
AssistedDr. Oddono with right, ultrasound guided, adductor canal block. Side rails up, monitors on throughout procedure. See vital signs in flow sheet. Tolerated Procedure well.  

## 2017-11-11 NOTE — H&P (Signed)
TOTAL KNEE ADMISSION H&P  Patient is being admitted for right total knee arthroplasty.  Subjective:  Chief Complaint:right knee pain.  HPI: Allison Bass, 77 y.o. female, has a history of pain and functional disability in the right knee due to arthritis and has failed non-surgical conservative treatments for greater than 12 weeks to includeNSAID's and/or analgesics, corticosteriod injections, viscosupplementation injections, flexibility and strengthening excercises, use of assistive devices and activity modification.  Onset of symptoms was gradual, starting 3 years ago with gradually worsening course since that time. The patient noted prior procedures on the knee to include  arthroscopy on the right knee(s).  Patient currently rates pain in the right knee(s) at 10 out of 10 with activity. Patient has night pain, worsening of pain with activity and weight bearing, pain that interferes with activities of daily living, pain with passive range of motion, crepitus and joint swelling.  Patient has evidence of subchondral sclerosis, periarticular osteophytes and joint space narrowing by imaging studies. There is no active infection.  Patient Active Problem List   Diagnosis Date Noted  . Unilateral primary osteoarthritis, right knee 10/11/2017  . S/P right knee arthroscopy 05/05/2017  . Ingrown toenail 04/12/2017  . Arthritis of right knee 01/27/2017  . Localization-related idiopathic epilepsy and epileptic syndromes with seizures of localized onset, not intractable, without status epilepticus ([place]) 09/09/2016  . Injury of left ankle and foot 07/13/2016  . 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 Essential hypertension 05/17/2016  . Right knee pain 04/23/2016  . Ankle sprain 04/01/2016  . Low back pain 01/27/2016  . Faintness 01/27/2016  . Transient alteration of awareness 01/27/2016  . Poison ivy dermatitis 03/11/2015  . Renovascular hypertension  02/24/2015  . Epilepsy ([place]) 02/24/2015   Past Medical History:  Diagnosis Date  . Anxiety   . Arthritis   . Chicken pox   . Depression   . Epilepsy ([place])    controlled by Dilantin- last seizure 1995  . GERD (gastroesophageal reflux disease)   . 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 HTN (hypertension)   . Migraine   . Pneumonia   . Seasonal allergies   . Seizures ([place])   . Stroke [facility])    2017    Past Surgical History:  Procedure Laterality Date  . ABDOMINAL HYSTERECTOMY    . APPENDECTOMY     with hysterectomy  . CATARACT EXTRACTION Bilateral   . IR ANGIO INTRA EXTRACRAN SEL COM CAROTID INNOMINATE BILAT MOD SED  07/18/2017  . IR ANGIO VERTEBRAL SEL VERTEBRAL BILAT MOD SED  07/18/2017  . IR RADIOLOGIST EVAL & MGMT  06/29/2017  . IR RADIOLOGIST EVAL & MGMT  07/27/2017  . IR RADIOLOGIST EVAL & MGMT  09/23/2017  . KNEE ARTHROSCOPY     RIGHT  . LYMPHADENECTOMY     Benign, age 13  . RADIOLOGY WITH ANESTHESIA N/A 07/18/2017   Procedure: EMBOLIZATION;  Surgeon: Luanne Bras, MD;  Location: [facility];  Service: Radiology;  Laterality: N/A;  . RADIOLOGY WITH ANESTHESIA N/A 07/21/2017   Procedure: MRI BRAIN WITHOUT;  Surgeon: Radiologist, Medication, MD;  Location: [facility];  Service: Radiology;  Laterality: N/A;  . TONSILLECTOMY AND ADENOIDECTOMY      Current Facility-Administered Medications  Medication Dose Route Frequency Provider Last Rate Last Dose  . ceFAZolin (ANCEF) IVPB 2g/100 mL premix  2 g Intravenous On Call to OR Pete Pelt, PA-C      . chlorhexidine (HIBICLENS) 4 %  liquid 4 application  60 mL Topical Once Pete Pelt, PA-C       Allergies  Allergen Reactions  . Erythromycin Other (See Comments)    UNSPECIFIED REACTION   . Codeine Nausea And Vomiting, Rash and Other (See Comments)    Makes the patient walk into walls    Social History   Tobacco Use  . Smoking status: Never Smoker  . Smokeless tobacco: Never Used  Substance Use Topics  . Alcohol  use: Yes    Alcohol/week: 0.0 oz    Comment: Occ    Family History  Problem Relation Age of Onset  . Alcoholism Father   . Lung cancer Father        Smoker  . Stroke Father   . High blood pressure Father   . Breast cancer Maternal Grandmother   . Heart disease Mother   . High blood pressure Mother   . Depression Mother   . Seizures Mother   . Colon cancer Neg Hx   . Esophageal cancer Neg Hx   . Stomach cancer Neg Hx   . Rectal cancer Neg Hx      Review of Systems  Musculoskeletal: Positive for joint pain.  All other systems reviewed and are negative.   Objective:  Physical Exam  Constitutional: She is oriented to person, place, and time. She appears well-developed and well-nourished.  HENT:  Head: Normocephalic and atraumatic.  Eyes: EOM are normal. Pupils are equal, round, and reactive to light.  Neck: Normal range of motion. Neck supple.  Cardiovascular: Normal rate and regular rhythm.  Respiratory: Effort normal and breath sounds normal.  GI: Soft. Bowel sounds are normal.  Musculoskeletal:       Right knee: She exhibits swelling and abnormal alignment. She exhibits normal range of motion. Tenderness found. Medial joint line and lateral joint line tenderness noted.  Neurological: She is alert and oriented to person, place, and time.  Skin: Skin is warm and dry.  Psychiatric: She has a normal mood and affect.    Vital signs in last 24 hours: Temp:  [98.1 F (36.7 C)] 98.1 F (36.7 C) (01/18 0658) Pulse Rate:  [76] 76 (01/18 0659) Resp:  [18] 18 (01/18 0658) BP: (128)/(61) 128/61 (01/18 0658) SpO2:  [99 %] 99 % (01/18 0658)  Labs:   Estimated body mass index is 26.23 kg/m as calculated from the following:   Height as of 11/08/17: 5\' 6"  (1.676 m).   Weight as of 11/08/17: 162 lb 8 oz (73.7 kg).   Imaging Review Plain radiographs demonstrate severe degenerative joint disease of the right knee(s). The overall alignment ismild valgus. The bone quality  appears to be good for age and reported activity level.  Assessment/Plan:  End stage arthritis, right knee   The patient history, physical examination, clinical judgment of the provider and imaging studies are consistent with end stage degenerative joint disease of the right knee(s) and total knee arthroplasty is deemed medically necessary. The treatment options including medical management, injection therapy arthroscopy and arthroplasty were discussed at length. The risks and benefits of total knee arthroplasty were presented and reviewed. The risks due to aseptic loosening, infection, stiffness, patella tracking problems, thromboembolic complications and other imponderables were discussed. The patient acknowledged the explanation, agreed to proceed with the plan and consent was signed. Patient is being admitted for inpatient treatment for surgery, pain control, PT, OT, prophylactic antibiotics, VTE prophylaxis, progressive ambulation and ADL's and discharge planning. The patient is planning to be  discharged home with home health services

## 2017-11-11 NOTE — Transfer of Care (Signed)
Immediate Anesthesia Transfer of Care Note  Patient: Allison Bass  Procedure(s) Performed: RIGHT TOTAL KNEE ARTHROPLASTY (Right Knee)  Patient Location: PACU  Anesthesia Type:Spinal  Level of Consciousness: awake, alert  and oriented  Airway & Oxygen Therapy: Patient Spontanous Breathing and Patient connected to face mask oxygen  Post-op Assessment: Report given to RN and Post -op Vital signs reviewed and stable  Post vital signs: Reviewed and stable  Last Vitals:  Vitals:   11/11/17 0832 11/11/17 0833  BP:    Pulse: 65 72  Resp: 10 (!) 25  Temp:    SpO2: 100% 100%    Last Pain:  Vitals:   11/11/17 0658  TempSrc: Oral         Complications: No apparent anesthesia complications

## 2017-11-11 NOTE — Anesthesia Postprocedure Evaluation (Signed)
Anesthesia Post Note  Patient: Allison Bass  Procedure(s) Performed: RIGHT TOTAL KNEE ARTHROPLASTY (Right Knee)     Patient location during evaluation: SICU Anesthesia Type: Spinal Level of consciousness: sedated Pain management: pain level controlled Vital Signs Assessment: post-procedure vital signs reviewed and stable Respiratory status: patient remains intubated per anesthesia plan Cardiovascular status: stable Postop Assessment: no apparent nausea or vomiting Anesthetic complications: no    Last Vitals:  Vitals:   11/11/17 1043 11/11/17 1053  BP: (!) 120/57 (!) 112/57  Pulse: 86 84  Resp: 20 (!) 7  Temp: 36.9 C   SpO2: 99% 100%    Last Pain:  Vitals:   11/11/17 0658  TempSrc: Oral                 Ragan Duhon

## 2017-11-11 NOTE — Brief Op Note (Signed)
11/11/2017  10:15 AM  PATIENT:  Allison Bass  77 y.o. female  PRE-OPERATIVE DIAGNOSIS:  osteoarthritis right knee  POST-OPERATIVE DIAGNOSIS:  osteoarthritis right knee  PROCEDURE:  Procedure(s): RIGHT TOTAL KNEE ARTHROPLASTY (Right)  SURGEON:  Surgeon(s) and Role:    Mcarthur Rossetti, MD - Primary  PHYSICIAN ASSISTANT: Benita Stabile, PA-C  ANESTHESIA:   regional and spinal  COUNTS:  YES  TOURNIQUET:   Total Tourniquet Time Documented: Thigh (Right) - 112 minutes Total: Thigh (Right) - 112 minutes   DICTATION: .Other Dictation: Dictation Number [id number]  PLAN OF CARE: Admit to inpatient   PATIENT DISPOSITION:  PACU - hemodynamically stable.   Delay start of Pharmacological VTE agent (>24hrs) due to surgical blood loss or risk of bleeding: no

## 2017-11-11 NOTE — Anesthesia Procedure Notes (Signed)
Anesthesia Regional Block: Adductor canal block   Pre-Anesthetic Checklist: ,, timeout performed, Correct Patient, Correct Site, Correct Laterality, Correct Procedure, Correct Position, site marked, Risks and benefits discussed,  Surgical consent,  Pre-op evaluation,  At surgeon's request and post-op pain management  Laterality: Right  Prep: chloraprep       Needles:  Injection technique: Single-shot  Needle Type: Echogenic Stimulator Needle     Needle Length: 5cm  Needle Gauge: 22     Additional Needles:   Procedures:, nerve stimulator,,, ultrasound used (permanent image in chart),,,,  Narrative:  Start time: 11/11/2017 8:25 AM End time: 11/11/2017 8:33 AM Injection made incrementally with aspirations every 5 mL.  Performed by: Personally  Anesthesiologist: Janeece Riggers, MD  Additional Notes: Functioning IV was confirmed and monitors were applied.  A 66mm 22ga Arrow echogenic stimulator needle was used. Sterile prep and drape,hand hygiene and sterile gloves were used. Ultrasound guidance: relevant anatomy identified, needle position confirmed, local anesthetic spread visualized around nerve(s)., vascular puncture avoided.  Image printed for medical record. Negative aspiration and negative test dose prior to incremental administration of local anesthetic. The patient tolerated the procedure well.

## 2017-11-11 NOTE — Progress Notes (Signed)
X-ray results noted 

## 2017-11-11 NOTE — Progress Notes (Signed)
Portable AP and Lateral Right Knee X-rays done. 

## 2017-11-12 LAB — CBC
HCT: 33.3 % — ABNORMAL LOW (ref 36.0–46.0)
Hemoglobin: 11.3 g/dL — ABNORMAL LOW (ref 12.0–15.0)
MCH: 31.1 pg (ref 26.0–34.0)
MCHC: 33.9 g/dL (ref 30.0–36.0)
MCV: 91.7 fL (ref 78.0–100.0)
PLATELETS: 192 10*3/uL (ref 150–400)
RBC: 3.63 MIL/uL — AB (ref 3.87–5.11)
RDW: 13.4 % (ref 11.5–15.5)
WBC: 13 10*3/uL — AB (ref 4.0–10.5)

## 2017-11-12 LAB — BASIC METABOLIC PANEL WITH GFR
Anion gap: 9 (ref 5–15)
BUN: 11 mg/dL (ref 6–20)
CO2: 26 mmol/L (ref 22–32)
Calcium: 8.1 mg/dL — ABNORMAL LOW (ref 8.9–10.3)
Chloride: 94 mmol/L — ABNORMAL LOW (ref 101–111)
Creatinine, Ser: 0.74 mg/dL (ref 0.44–1.00)
GFR calc Af Amer: >60 mL/min
GFR calc non Af Amer: >60 mL/min
Glucose, Bld: 117 mg/dL — ABNORMAL HIGH (ref 65–99)
Potassium: 2.9 mmol/L — ABNORMAL LOW (ref 3.5–5.1)
Sodium: 129 mmol/L — ABNORMAL LOW (ref 135–145)

## 2017-11-12 MED ORDER — POTASSIUM CHLORIDE ER 10 MEQ PO TBCR
40.0000 meq | EXTENDED_RELEASE_TABLET | Freq: Two times a day (BID) | ORAL | Status: DC
  Administered 2017-11-12 – 2017-11-14 (×4): 40 meq via ORAL
  Filled 2017-11-12 (×9): qty 4

## 2017-11-12 MED ORDER — POTASSIUM CHLORIDE ER 10 MEQ PO TBCR: 40.0000 meq | EXTENDED_RELEASE_TABLET | Freq: Two times a day (BID) | ORAL | Status: DC

## 2017-11-12 NOTE — Progress Notes (Signed)
Physical Therapy Treatment Patient Details Name: Allison Bass MRN: [id number] DOB: 07/16/1941 Today's Date: 11/12/2017    History of Present Illness Pt s/p R TKR and with hx of CVA and sz    PT Comments    Pt cooperative but continues ltd by pain and by onset of dizziness and nausea with attempts to stand/ambulate.  BP sitting 127/52, after standing and returning to sitting 108/42.  RN aware.   Follow Up Recommendations  Home health PT;DC plan and follow up therapy as arranged by surgeon     Equipment Recommendations  None recommended by PT    Recommendations for Other Services OT consult     Precautions / Restrictions Precautions Precautions: Knee;Fall Required Braces or Orthoses: Knee Immobilizer - Right Knee Immobilizer - Right: Discontinue once straight leg raise with < 10 degree lag Restrictions Weight Bearing Restrictions: No Other Position/Activity Restrictions: WBAT    Mobility  Bed Mobility Overal bed mobility: Needs Assistance Bed Mobility: Supine to Sit;Sit to Supine     Supine to sit: Min assist;+2 for physical assistance;+2 for safety/equipment Sit to supine: +2 for physical assistance;Mod assist   General bed mobility comments: cues for sequence and use of L LE to self assist  Transfers Overall transfer level: Needs assistance Equipment used: Rolling walker (2 wheeled) Transfers: Sit to/from Stand Sit to Stand: Mod assist;+2 physical assistance;+2 safety/equipment         General transfer comment: cues for LE management and use of UEs to self assist  Ambulation/Gait Ambulation/Gait assistance: Mod assist;+2 physical assistance;+2 safety/equipment Ambulation Distance (Feet): 2 Feet Assistive device: Rolling walker (2 wheeled) Gait Pattern/deviations: Step-to pattern;Decreased step length - right;Decreased step length - left;Shuffle;Trunk flexed Gait velocity: decr Gait velocity interpretation: Below normal speed for age/gender General  Gait Details: cues for sequence, posture and position from RW; distance ltd by c/o dizziness/nausea   Stairs            Wheelchair Mobility    Modified Rankin (Stroke Patients Only)       Balance Overall balance assessment: Needs assistance Sitting-balance support: Single extremity supported;Feet supported Sitting balance-Leahy Scale: Fair     Standing balance support: Bilateral upper extremity supported Standing balance-Leahy Scale: Poor                              Cognition Arousal/Alertness: Awake/alert Behavior During Therapy: WFL for tasks assessed/performed Overall Cognitive Status: Within Functional Limits for tasks assessed                                 General Comments: Pt passed out with attempt to ambulate      Exercises Total Joint Exercises Ankle Circles/Pumps: AROM;Both;15 reps;Supine Quad Sets: AROM;Both;10 reps;Supine Heel Slides: AAROM;Right;Supine;10 reps Straight Leg Raises: AAROM;Right;5 reps;Supine Goniometric ROM: AAROM R knee -10 - 25    General Comments        Pertinent Vitals/Pain Pain Assessment: 0-10 Pain Score: 7  Pain Location: R knee Pain Descriptors / Indicators: Aching;Sore;Grimacing Pain Intervention(s): Limited activity within patient's tolerance;Monitored during session;Premedicated before session;Ice applied    Home Living                      Prior Function            PT Goals (current goals can now be found in the care plan section) Acute Rehab PT  Goals Patient Stated Goal: Regain IND and have less pain PT Goal Formulation: With patient Time For Goal Achievement: 11/18/17 Potential to Achieve Goals: Good Progress towards PT goals: Progressing toward goals    Frequency    7X/week      PT Plan Current plan remains appropriate    Co-evaluation              AM-PAC PT "6 Clicks" Daily Activity  Outcome Measure  Difficulty turning over in bed (including  adjusting bedclothes, sheets and blankets)?: Unable Difficulty moving from lying on back to sitting on the side of the bed? : Unable Difficulty sitting down on and standing up from a chair with arms (e.g., wheelchair, bedside commode, etc,.)?: Unable Help needed moving to and from a bed to chair (including a wheelchair)?: A Lot Help needed walking in hospital room?: A Lot Help needed climbing 3-5 steps with a railing? : Total 6 Click Score: 8    End of Session Equipment Utilized During Treatment: Gait belt;Right knee immobilizer Activity Tolerance: Patient limited by fatigue;Other (comment)(dizzy/nauseous) Patient left: in bed;with call bell/phone within reach;with nursing/sitter in room Nurse Communication: Mobility status PT Visit Diagnosis: Difficulty in walking, not elsewhere classified (R26.2)     Time: 7001-7494 PT Time Calculation (min) (ACUTE ONLY): 43 min  Charges:  $Gait Training: 8-22 mins $Therapeutic Exercise: 8-22 mins $Therapeutic Activity: 8-22 mins                    G Codes:       Pg [phone]    Allison Bass 11/12/2017, 1:02 PM

## 2017-11-12 NOTE — Evaluation (Signed)
Occupational Therapy Evaluation Patient Details Name: Allison Bass MRN: [id number] DOB: August 29, 1941 Today's Date: 11/12/2017    History of Present Illness Pt s/p R TKR and with hx of CVA and sz   Clinical Impression   Pt admitted with above. She demonstrates the below listed deficits and will benefit from continued OT to maximize safety and independence with BADLs.  Pt requires mod - max A for LB ADLs, and min A +2 (safety) for functional transfers.  She is limited due to orthostasis (see below for BP).   Pt lives alone and was fully independent PTA.  She prefers to discharge home, but based on current progress, feel she may need a short term rehab stay.  Will follow acutely.    BP supine 135/61 Sitting:  122/49 Standing:  100/46 (pt symptomatic)      Follow Up Recommendations  SNF;Supervision/Assistance - 24 hour    Equipment Recommendations  None recommended by OT    Recommendations for Other Services       Precautions / Restrictions Precautions Precautions: Knee;Fall Required Braces or Orthoses: Knee Immobilizer - Right Knee Immobilizer - Right: Discontinue once straight leg raise with < 10 degree lag Restrictions Weight Bearing Restrictions: No Other Position/Activity Restrictions: WBAT      Mobility Bed Mobility Overal bed mobility: Needs Assistance Bed Mobility: Supine to Sit     Supine to sit: Min assist;+2 for physical assistance     General bed mobility comments: verbal cues for sequencing and assist for Rt LE as well as to lift trunk   Transfers Overall transfer level: Needs assistance Equipment used: Rolling walker (2 wheeled) Transfers: Stand Pivot Transfers;Sit to/from Stand Sit to Stand: Min assist;+2 safety/equipment Stand pivot transfers: Min assist;+2 safety/equipment       General transfer comment: assist to move into standing, and assist to steady as well as maneuver RW     Balance Overall balance assessment: Needs  assistance Sitting-balance support: Feet supported Sitting balance-Leahy Scale: Fair     Standing balance support: Bilateral upper extremity supported Standing balance-Leahy Scale: Poor                             ADL either performed or assessed with clinical judgement   ADL Overall ADL's : Needs assistance/impaired Eating/Feeding: Independent   Grooming: Wash/dry hands;Wash/dry face;Oral care;Brushing hair;Sitting   Upper Body Bathing: Set up;Sitting   Lower Body Bathing: Sit to/from stand;Maximal assistance   Upper Body Dressing : Set up;Sitting   Lower Body Dressing: Total assistance;Sit to/from stand   Toilet Transfer: Minimal assistance;Stand-pivot;Ambulation;BSC;RW;+2 for safety/equipment   Toileting- Clothing Manipulation and Hygiene: Maximal assistance;Sit to/from stand       Functional mobility during ADLs: Minimal assistance;+2 for safety/equipment;Rolling walker       Vision         Perception     Praxis      Pertinent Vitals/Pain Pain Assessment: Faces Faces Pain Scale: Hurts little more Pain Location: R knee Pain Descriptors / Indicators: Aching;Sore;Grimacing Pain Intervention(s): Monitored during session     Hand Dominance Right   Extremity/Trunk Assessment Upper Extremity Assessment Upper Extremity Assessment: Overall WFL for tasks assessed   Lower Extremity Assessment Lower Extremity Assessment: Defer to PT evaluation   Cervical / Trunk Assessment Cervical / Trunk Assessment: Normal   Communication Communication Communication: No difficulties   Cognition Arousal/Alertness: Awake/alert Behavior During Therapy: WFL for tasks assessed/performed Overall Cognitive Status: Within Functional Limits for tasks assessed  General Comments  BP supine 135/61, HR 97; sitting 122/49, HR 103; standing 100/46.       Exercises     Shoulder Instructions      Home Living  Family/patient expects to be discharged to:: Private residence Living Arrangements: Alone Available Help at Discharge: Family;Friend(s);Available 24 hours/day Type of Home: House Home Access: Stairs to enter [employer] of Steps: 2 Entrance Stairs-Rails: Right Home Layout: One level     Bathroom Shower/Tub: Occupational psychologist: Handicapped height Bathroom Accessibility: Yes How Accessible: Accessible via walker Home Equipment: [place] - 2 wheels;Cane - single point;Shower seat;Grab bars - toilet;Grab bars - tub/shower          Prior Functioning/Environment Level of Independence: Independent with assistive device(s)        Comments: using RW 2* knee pain        OT Problem List: Decreased activity tolerance;Impaired balance (sitting and/or standing);Decreased knowledge of use of DME or AE;Decreased knowledge of precautions;Pain      OT Treatment/Interventions: Self-care/ADL training;DME and/or AE instruction;Therapeutic activities;Patient/family education;Balance training    OT Goals(Current goals can be found in the care plan section) Acute Rehab OT Goals Patient Stated Goal: to go home  OT Goal Formulation: With patient Time For Goal Achievement: 11/19/17 Potential to Achieve Goals: Good ADL Goals Pt Will Perform Grooming: with supervision;standing Pt Will Perform Lower Body Bathing: with supervision;sit to/from stand;with adaptive equipment Pt Will Perform Lower Body Dressing: with supervision;sit to/from stand;with adaptive equipment Pt Will Transfer to Toilet: with supervision;ambulating;regular height toilet;grab bars Pt Will Perform Toileting - Clothing Manipulation and hygiene: with supervision;sit to/from stand Pt Will Perform Tub/Shower Transfer: Shower transfer;with supervision;ambulating;shower seat;grab bars;rolling walker  OT Frequency: Min 2X/week   Barriers to D/C: Decreased caregiver support          Co-evaluation PT/OT/SLP  Co-Evaluation/Treatment: Yes Reason for Co-Treatment: For patient/therapist safety   OT goals addressed during session: ADL's and self-care      AM-PAC PT "6 Clicks" Daily Activity     Outcome Measure Help from another person eating meals?: None Help from another person taking care of personal grooming?: A Little Help from another person toileting, which includes using toliet, bedpan, or urinal?: A Lot Help from another person bathing (including washing, rinsing, drying)?: A Lot Help from another person to put on and taking off regular upper body clothing?: A Little Help from another person to put on and taking off regular lower body clothing?: Total 6 Click Score: 15   End of Session Equipment Utilized During Treatment: Rolling walker;Gait belt CPM Right Knee CPM Right Knee: Off Nurse Communication: Mobility status  Activity Tolerance: Treatment limited secondary to medical complications (Comment)(orthstasis ) Patient left: in chair;with call bell/phone within reach;with chair alarm set  OT Visit Diagnosis: Unsteadiness on feet (R26.81);Pain Pain - Right/Left: Right Pain - part of body: Knee                Time: 6283-6629 OT Time Calculation (min): 25 min Charges:  OT General Charges $OT Visit: 1 Visit OT Evaluation $OT Eval Moderate Complexity: 1 Mod G-Codes:     [facility], OTR/L [phone]   Lucille Passy M 11/12/2017, 5:08 PM

## 2017-11-12 NOTE — Progress Notes (Signed)
Patient ID: Allison Bass, female   DOB: 04-18-1941, 77 y.o.   MRN: [id number] The patient and her family are uncomfortable with her being discharged tomorrow (Sunday) and would like an extra day of therapy in the hospital to help determine whether or not she would benefit from skilled nursing after her hospital stay.

## 2017-11-12 NOTE — Discharge Instructions (Signed)

## 2017-11-12 NOTE — Plan of Care (Signed)
  Pain Managment: General experience of comfort will improve 11/12/2017 2045 - Progressing by Mickie Kay, RN

## 2017-11-12 NOTE — Progress Notes (Signed)
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   BP supine 135/61, sit 126/48, stand 103/46 and after ambulating ~93ft 100/46 with c/o dizziness.  RN aware.  Follow Up Recommendations  Home health PT;DC plan and follow up therapy as arranged by surgeon     Equipment Recommendations  None recommended by PT    Recommendations for Other Services OT consult     Precautions / Restrictions Precautions Precautions: Knee;Fall Required Braces or Orthoses: Knee Immobilizer - Right Knee Immobilizer - Right: Discontinue once straight leg raise with < 10 degree lag Restrictions Weight Bearing Restrictions: No Other Position/Activity Restrictions: WBAT    Mobility  Bed Mobility Overal bed mobility: Needs Assistance Bed Mobility: Supine to Sit     Supine to sit: Min assist;+2 for physical assistance     General bed mobility comments: verbal cues for sequencing and assist for Rt LE as well as to lift trunk   Transfers Overall transfer level: Needs assistance Equipment used: Rolling walker (2 wheeled) Transfers: Stand Pivot Transfers;Sit to/from Stand Sit to Stand: Min assist;+2 safety/equipment Stand pivot transfers: Min assist;+2 safety/equipment       General transfer comment: assist to move into standing, and assist to steady as well as maneuver RW   Ambulation/Gait Ambulation/Gait assistance: Min assist;+2 physical assistance;+2 safety/equipment Ambulation Distance (Feet): 8 Feet Assistive device: Rolling walker (2 wheeled) Gait Pattern/deviations: Step-to pattern;Decreased step length - right;Decreased step length - left;Shuffle;Trunk flexed Gait velocity: decr Gait velocity  interpretation: Below normal speed for age/gender General Gait Details: cues for sequence, posture and position from RW; distance ltd by c/o dizziness/nausea   Stairs            Wheelchair Mobility    Modified Rankin (Stroke Patients Only)       Balance Overall balance assessment: Needs assistance Sitting-balance support: Feet supported Sitting balance-Leahy Scale: Fair     Standing balance support: Bilateral upper extremity supported Standing balance-Leahy Scale: Poor                              Cognition Arousal/Alertness: Awake/alert Behavior During Therapy: WFL for tasks assessed/performed Overall Cognitive Status: Within Functional Limits for tasks assessed                                        Exercises      General Comments General comments (skin integrity, edema, etc.): BP supine 135/61, HR 97; sitting 122/49, HR 103; standing 100/46.         Pertinent Vitals/Pain Pain Assessment: Faces Faces Pain Scale: Hurts little more Pain Location: R knee Pain Descriptors / Indicators: Aching;Sore;Grimacing Pain Intervention(s): Limited activity within patient's tolerance;Monitored during session;Premedicated before session;Ice applied    Home Living Family/patient expects to be discharged to:: Private residence Living Arrangements: Alone Available Help at Discharge: Family;Friend(s);Available 24 hours/day Type of Home: House Home Access: Stairs to enter Entrance Stairs-Rails: Right Home Layout: One level Home Equipment: Walker - 2 wheels;Cane - single point;Shower seat;Grab bars - toilet;Grab bars - tub/shower  Prior Function Level of Independence: Independent with assistive device(s)      Comments: using RW 2* knee pain   PT Goals (current goals can now be found in the care plan section) Acute Rehab PT Goals Patient Stated Goal: to go home  PT Goal Formulation: With patient Time For Goal Achievement:  11/18/17 Potential to Achieve Goals: Good Progress towards PT goals: Progressing toward goals    Frequency    7X/week      PT Plan Current plan remains appropriate    Co-evaluation PT/OT/SLP Co-Evaluation/Treatment: Yes Reason for Co-Treatment: For patient/therapist safety PT goals addressed during session: Mobility/safety with mobility OT goals addressed during session: ADL's and self-care      AM-PAC PT "6 Clicks" Daily Activity  Outcome Measure  Difficulty turning over in bed (including adjusting bedclothes, sheets and blankets)?: Unable Difficulty moving from lying on back to sitting on the side of the bed? : Unable Difficulty sitting down on and standing up from a chair with arms (e.g., wheelchair, bedside commode, etc,.)?: Unable Help needed moving to and from a bed to chair (including a wheelchair)?: A Lot Help needed walking in hospital room?: A Lot Help needed climbing 3-5 steps with a railing? : Total 6 Click Score: 8    End of Session Equipment Utilized During Treatment: Gait belt;Right knee immobilizer Activity Tolerance: Patient limited by fatigue;Other (comment) Patient left: with call bell/phone within reach;with nursing/sitter in room;in chair Nurse Communication: Mobility status PT Visit Diagnosis: Difficulty in walking, not elsewhere classified (R26.2)     Time: 9390-3009 PT Time Calculation (min) (ACUTE ONLY): 25 min  Charges:  $Gait Training: 8-22 mins                    G Codes:       Pg [phone]    Emmelyn Schmale 11/12/2017, 5:48 PM

## 2017-11-12 NOTE — Progress Notes (Signed)
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  No c/o dizziness this pm with short distance ambulated back to bed.   Follow Up Recommendations  Home health PT;DC plan and follow up therapy as arranged by surgeon     Equipment Recommendations  None recommended by PT    Recommendations for Other Services OT consult     Precautions / Restrictions Precautions Precautions: Knee;Fall Required Braces or Orthoses: Knee Immobilizer - Right Knee Immobilizer - Right: Discontinue once straight leg raise with < 10 degree lag Restrictions Weight Bearing Restrictions: No Other Position/Activity Restrictions: WBAT    Mobility  Bed Mobility Overal bed mobility: Needs Assistance Bed Mobility: Sit to Supine     Supine to sit: Min assist     General bed mobility comments: verbal cues for sequencing and assist for Rt LE   Transfers Overall transfer level: Needs assistance Equipment used: Rolling walker (2 wheeled) Transfers: Stand Pivot Transfers;Sit to/from Stand Sit to Stand: Min assist;+2 safety/equipment Stand pivot transfers: Min assist;+2 safety/equipment       General transfer comment: assist to move into standing, and assist to steady as well as maneuver RW   Ambulation/Gait Ambulation/Gait assistance: Min assist;+2 physical assistance;+2 safety/equipment Ambulation Distance (Feet): 3 Feet Assistive device: Rolling walker (2 wheeled) Gait Pattern/deviations: Step-to pattern;Decreased step length - right;Decreased step length - left;Shuffle;Trunk flexed Gait velocity: decr Gait velocity interpretation: Below normal speed for age/gender General Gait Details: cues for sequence, posture and position from RW; Pt denies dizziness   Stairs            Wheelchair Mobility     Modified Rankin (Stroke Patients Only)       Balance Overall balance assessment: Needs assistance Sitting-balance support: Feet supported Sitting balance-Leahy Scale: Fair     Standing balance support: Bilateral upper extremity supported Standing balance-Leahy Scale: Poor                              Cognition Arousal/Alertness: Awake/alert Behavior During Therapy: WFL for tasks assessed/performed Overall Cognitive Status: Within Functional Limits for tasks assessed                                        Exercises      General Comments General comments (skin integrity, edema, etc.): BP supine 135/61, HR 97; sitting 122/49, HR 103; standing 100/46.         Pertinent Vitals/Pain Pain Assessment: Faces Faces Pain Scale: Hurts little more Pain Location: R knee Pain Descriptors / Indicators: Aching;Sore;Grimacing Pain Intervention(s): Limited activity within patient's tolerance;Monitored during session;Premedicated before session;Ice applied    Home Living Family/patient expects to be discharged to:: Private residence Living Arrangements: Alone Available Help at Discharge: Family;Friend(s);Available 24 hours/day Type of Home: House Home Access: Stairs to enter Entrance Stairs-Rails: Right Home Layout: One level Home Equipment: Walker - 2 wheels;Cane - single point;Shower seat;Grab bars - toilet;Grab bars - tub/shower      Prior Function Level of Independence: Independent with assistive device(s)      Comments: using RW 2* knee pain   PT Goals (current goals can now be found in the care plan  section) Acute Rehab PT Goals Patient Stated Goal: to go home  PT Goal Formulation: With patient Time For Goal Achievement: 11/18/17 Potential to Achieve Goals: Good Progress towards PT goals: Progressing toward goals    Frequency    7X/week      PT Plan Current plan remains appropriate    Co-evaluation PT/OT/SLP  Co-Evaluation/Treatment: Yes Reason for Co-Treatment: For patient/therapist safety PT goals addressed during session: Mobility/safety with mobility OT goals addressed during session: ADL's and self-care      AM-PAC PT "6 Clicks" Daily Activity  Outcome Measure  Difficulty turning over in bed (including adjusting bedclothes, sheets and blankets)?: Unable Difficulty moving from lying on back to sitting on the side of the bed? : Unable Difficulty sitting down on and standing up from a chair with arms (e.g., wheelchair, bedside commode, etc,.)?: Unable Help needed moving to and from a bed to chair (including a wheelchair)?: A Lot Help needed walking in hospital room?: A Lot Help needed climbing 3-5 steps with a railing? : Total 6 Click Score: 8    End of Session Equipment Utilized During Treatment: Gait belt;Right knee immobilizer Activity Tolerance: Patient tolerated treatment well Patient left: in bed;with call bell/phone within reach;with nursing/sitter in room;with bed alarm set Nurse Communication: Mobility status PT Visit Diagnosis: Difficulty in walking, not elsewhere classified (R26.2)     Time: 1660-6301 PT Time Calculation (min) (ACUTE ONLY): 12 min  Charges:  $Gait Training: 8-22 mins $Therapeutic Activity: 8-22 mins                    G Codes:       Pg [phone]    Allison Bass 11/12/2017, 5:57 PM

## 2017-11-12 NOTE — Progress Notes (Signed)
Subjective: 1 Day Post-Op Procedure(s) (LRB): RIGHT TOTAL KNEE ARTHROPLASTY (Right) Patient reports pain as moderate.  Doing ok this am without any lightheadedness or dizziness.  No chest pain, pressure, palpitations or sob. She does report passing out yesterday after taking a few steps while being lightheaded.    Objective: Vital signs in last 24 hours: Temp:  [97.7 F (36.5 C)-98.9 F (37.2 C)] 98.5 F (36.9 C) (01/19 0551) Pulse Rate:  [70-101] 89 (01/19 0551) Resp:  [7-22] 18 (01/19 0551) BP: (91-150)/(47-88) 113/48 (01/19 0551) SpO2:  [97 %-100 %] 99 % (01/19 0551)  Intake/Output from previous day: 01/18 0701 - 01/19 0700 In: 2245 [P.O.:540; I.V.:1550; IV Piggyback:155] Out: 7001 [Urine:1250; Blood:25] Intake/Output this shift: No intake/output data recorded.  Recent Labs    11/12/17 0609  HGB 11.3*   Recent Labs    11/12/17 0609  WBC 13.0*  RBC 3.63*  HCT 33.3*  PLT 192   Recent Labs    11/12/17 0609  NA 129*  K 2.9*  CL 94*  CO2 26  BUN 11  CREATININE 0.74  GLUCOSE 117*  CALCIUM 8.1*   No results for input(s): LABPT, INR in the last 72 hours.  Neurologically intact Neurovascular intact Sensation intact distally Intact pulses distally Dorsiflexion/Plantar flexion intact Incision: dressing C/D/I No cellulitis present Compartment soft  Assessment/Plan: 1 Day Post-Op Procedure(s) (LRB): RIGHT TOTAL KNEE ARTHROPLASTY (Right) Advance diet Up with therapy D/C IV fluids Discharge home with home health likely tomorrow or Monday WBAT RLE ABLA-mild  Will d/c fluids likely due to dilutional hyponatremia  PLEASE APPLY TED HOSE TO BLE   Aundra Dubin 11/12/2017, 9:43 AM

## 2017-11-13 ENCOUNTER — Encounter (INDEPENDENT_AMBULATORY_CARE_PROVIDER_SITE_OTHER): Payer: Self-pay | Admitting: Orthopaedic Surgery

## 2017-11-13 LAB — BASIC METABOLIC PANEL
ANION GAP: 10 (ref 5–15)
BUN: 11 mg/dL (ref 6–20)
CALCIUM: 8.5 mg/dL — AB (ref 8.9–10.3)
CO2: 26 mmol/L (ref 22–32)
Chloride: 91 mmol/L — ABNORMAL LOW (ref 101–111)
Creatinine, Ser: 0.75 mg/dL (ref 0.44–1.00)
GLUCOSE: 101 mg/dL — AB (ref 65–99)
POTASSIUM: 3.4 mmol/L — AB (ref 3.5–5.1)
SODIUM: 127 mmol/L — AB (ref 135–145)

## 2017-11-13 NOTE — Progress Notes (Signed)
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  Assisted OOB required increased time and limited amb distance of 6 feet.   Follow Up Recommendations  SNF(per LPT)     Equipment Recommendations  None recommended by PT    Recommendations for Other Services       Precautions / Restrictions Precautions Precautions: Knee;Fall Precaution Comments: instructed on KI use for amb  Required Braces or Orthoses: Knee Immobilizer - Right Knee Immobilizer - Right: Discontinue once straight leg raise with < 10 degree lag Restrictions Weight Bearing Restrictions: No RLE Weight Bearing: Weight bearing as tolerated    Mobility  Bed Mobility Overal bed mobility: Needs Assistance Bed Mobility: Supine to Sit     Supine to sit: Min assist     General bed mobility comments: Assist for R LE plus increased time  Transfers Overall transfer level: Needs assistance Equipment used: Rolling walker (2 wheeled) Transfers: Sit to/from Stand Sit to Stand: Min assist;Mod assist         General transfer comment: from elevated surface and 50% VC's on proper hand placement to push off vs pull up on walker  Ambulation/Gait Ambulation/Gait assistance: Min assist;+2 safety/equipment Ambulation Distance (Feet): 6 Feet Assistive device: Rolling walker (2 wheeled) Gait Pattern/deviations: Step-to pattern;Trunk flexed;Antalgic Gait velocity: decr   General Gait Details: Assist to stabilize pt. Close follow with recliner. VCs safety, technique, sequence, step length.   amb distance limited by c/o "faint"   BP was 135/112 and HR 90        Stairs            Wheelchair Mobility    Modified Rankin (Stroke Patients Only)       Balance                                             Cognition Arousal/Alertness: Awake/alert Behavior During Therapy: Anxious Overall Cognitive Status: Within Functional Limits for tasks assessed                                 General Comments: low pain tolerance     delicate       Exercises      General Comments        Pertinent Vitals/Pain Pain Assessment: No/denies pain Pain Score: 3  Pain Location: R knee Pain Descriptors / Indicators: Aching;Crying;Moaning;Operative site guarding;Grimacing;Guarding Pain Intervention(s): Monitored during session;Repositioned;Premedicated before session;Ice applied    Home Living                      Prior Function            PT Goals (current goals can now be found in the care plan section) Progress towards PT goals: Progressing toward goals    Frequency    7X/week      PT Plan Current plan remains appropriate    Co-evaluation              AM-PAC PT "6 Clicks" Daily Activity  Outcome Measure  Difficulty turning over in bed (  including adjusting bedclothes, sheets and blankets)?: Unable Difficulty moving from lying on back to sitting on the side of the bed? : Unable Difficulty sitting down on and standing up from a chair with arms (e.g., wheelchair, bedside commode, etc,.)?: Unable Help needed moving to and from a bed to chair (including a wheelchair)?: A Lot Help needed walking in hospital room?: A Lot Help needed climbing 3-5 steps with a railing? : Total 6 Click Score: 8    End of Session Equipment Utilized During Treatment: Gait belt;Right knee immobilizer Activity Tolerance: Patient limited by fatigue;Patient limited by pain Patient left: in chair;with call bell/phone within reach Nurse Communication: Mobility status PT Visit Diagnosis: Difficulty in walking, not elsewhere classified (R26.2);Pain Pain - Right/Left: Right     Time: 1561-5379 PT Time Calculation (min) (ACUTE ONLY): 20  min  Charges:  $Gait Training: 8-22 mins                    G Codes:       Rica Koyanagi  PTA [facility]  Acute  Rehab Pager      [phone]

## 2017-11-13 NOTE — Progress Notes (Signed)
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 Pt only able to walk ~7 feet on today. Limited by weakness, pain, dizziness, and nausea. She was able to tolerate some ROM exercises. Pain rated 7/10 during session. Feel pt may need a ST rehab stay. Encouraged pt to discuss this with the surgeon when she gets a chance. D/C plan was prearranged for HHPT. Will continue to follow and progress activity as tolerated.     Follow Up Recommendations  DC plan and follow up therapy as arranged by surgeon (Feel SNF may be needed due to slow progress)     Equipment Recommendations  None recommended by PT    Recommendations for Other Services OT consult     Precautions / Restrictions Precautions Precautions: Knee;Fall Precaution Comments: dizziness, nausea with ambulation Required Braces or Orthoses: Knee Immobilizer - Right Knee Immobilizer - Right: Discontinue once straight leg raise with < 10 degree lag Restrictions Weight Bearing Restrictions: No RLE Weight Bearing: Weight bearing as tolerated    Mobility  Bed Mobility Overal bed mobility: Needs Assistance Bed Mobility: Sit to Supine       Sit to supine: Min assist   General bed mobility comments: Assist for R LE.   Transfers Overall transfer level: Needs assistance Equipment used: Rolling walker (2 wheeled) Transfers: Sit to/from Stand Sit to Stand: Mod assist;+2 physical assistance Stand pivot transfers: Mod assist;+2 physical assistance(3rd person for safety )       General transfer comment: Assist to rise, stabilize, control descent. VCs safety, technique, hand/LE placement.   Ambulation/Gait Ambulation/Gait assistance: Min assist;+2 safety/equipment Ambulation Distance (Feet): 7 Feet Assistive device: Rolling  walker (2 wheeled) Gait Pattern/deviations: Step-to pattern;Trunk flexed;Antalgic     General Gait Details: Assist to stabilize pt. Close follow with recliner. VCs safety, technique, sequence, step length.    Stairs            Wheelchair Mobility    Modified Rankin (Stroke Patients Only)       Balance Overall balance assessment: Needs assistance Sitting-balance support: Feet supported Sitting balance-Leahy Scale: Fair     Standing balance support: Bilateral upper extremity supported Standing balance-Leahy Scale: Poor                              Cognition Arousal/Alertness: Awake/alert Behavior During Therapy: Anxious Overall Cognitive Status: Within Functional Limits for tasks assessed                                 General Comments: [facility] for basic instructions.  per CNA, pt very confused last pm       Exercises Total Joint Exercises Ankle Circles/Pumps: AROM;Both;Supine;10 reps Quad Sets: AROM;Both;10 reps;Supine Heel Slides: AAROM;Right;Supine;5 reps Straight Leg Raises: AAROM;Right;5 reps;Supine Goniometric ROM: ~10-35 degrees    General Comments General comments (skin integrity, edema, etc.): Discussed need for SNF, she is now agreeable       Pertinent Vitals/Pain Pain Assessment: 0-10 Pain Score: 7  Faces Pain Scale: Hurts worst Pain Location: R knee Pain Descriptors / Indicators: Aching;Crying;Moaning;Operative site guarding;Grimacing;Guarding Pain Intervention(s): Limited activity within patient's tolerance;Ice applied;Repositioned    Home Living  Prior Function            PT Goals (current goals can now be found in the care plan section) Progress towards PT goals: Progressing toward goals    Frequency    7X/week      PT Plan Current plan remains appropriate    Co-evaluation              AM-PAC PT "6 Clicks" Daily Activity  Outcome Measure  Difficulty turning over  in bed (including adjusting bedclothes, sheets and blankets)?: Unable Difficulty moving from lying on back to sitting on the side of the bed? : Unable Difficulty sitting down on and standing up from a chair with arms (e.g., wheelchair, bedside commode, etc,.)?: Unable Help needed moving to and from a bed to chair (including a wheelchair)?: A Lot Help needed walking in hospital room?: A Lot Help needed climbing 3-5 steps with a railing? : Total 6 Click Score: 8    End of Session Equipment Utilized During Treatment: Gait belt;Right knee immobilizer Activity Tolerance: Patient limited by fatigue;Patient limited by pain Patient left: in bed;with call bell/phone within reach;with bed alarm set   PT Visit Diagnosis: Difficulty in walking, not elsewhere classified (R26.2);Pain Pain - Right/Left: Right     Time: 7209-4709 PT Time Calculation (min) (ACUTE ONLY): 42 min  Charges:  $Gait Training: 8-22 mins $Therapeutic Exercise: 23-37 mins                    G Codes:          Weston Anna, MPT Pager: [phone]

## 2017-11-13 NOTE — Progress Notes (Signed)
Subjective: 2 Days Post-Op Procedure(s) (LRB): RIGHT TOTAL KNEE ARTHROPLASTY (Right) Patient reports pain as mild.  Feeling pretty good this am, but not mobilizing well with PT  Objective: Vital signs in last 24 hours: Temp:  [99.2 F (37.3 C)-100.7 F (38.2 C)] 99.3 F (37.4 C) (01/20 1009) Pulse Rate:  [87-98] 88 (01/20 1009) Resp:  [16-20] 18 (01/20 1009) BP: (110-134)/(44-56) 110/52 (01/20 1009) SpO2:  [94 %-97 %] 96 % (01/20 1009)  Intake/Output from previous day: 01/19 0701 - 01/20 0700 In: 2251.3 [P.O.:1650; I.V.:601.3] Out: 1005 [Urine:1005] Intake/Output this shift: Total I/O In: -  Out: 125 [Urine:125]  Recent Labs    11/12/17 0609  HGB 11.3*   Recent Labs    11/12/17 0609  WBC 13.0*  RBC 3.63*  HCT 33.3*  PLT 192   Recent Labs    11/12/17 0609  NA 129*  K 2.9*  CL 94*  CO2 26  BUN 11  CREATININE 0.74  GLUCOSE 117*  CALCIUM 8.1*   No results for input(s): LABPT, INR in the last 72 hours.  Neurologically intact Neurovascular intact Sensation intact distally Intact pulses distally Dorsiflexion/Plantar flexion intact Incision: dressing C/D/I No cellulitis present Compartment soft  Assessment/Plan: 2 Days Post-Op Procedure(s) (LRB): RIGHT TOTAL KNEE ARTHROPLASTY (Right) Advance diet Up with therapy Discharge to SNF once approved by insurance (likely Monday) Order put into social work consult for SNF placement upon discussion with patient this am WBAT RLE Dry dressing change prn  Aundra Dubin 11/13/2017, 11:49 AM

## 2017-11-13 NOTE — NC FL2 (Signed)
[place] LEVEL OF CARE SCREENING TOOL     IDENTIFICATION  Patient Name: Gracy Ehly Birthdate: 01/12/1941 Sex: female Admission Date (Current Location): 11/11/2017  [facility] and [place] Number:  Herbalist and Address:  [facility] [place],  [place], [place]      Provider Number: [id number]  Attending Physician Name and Address:  Mcarthur Rossetti  Relative Name and Phone Number:       Current Level of Care: Hospital Recommended Level of Care: [place] Prior Approval Number:    Date Approved/Denied:   PASRR Number: [id number]  Discharge Plan: SNF    Current Diagnoses: Patient Active Problem List   Diagnosis Date Noted  . Status post total right knee replacement 11/11/2017  . Unilateral primary osteoarthritis, right knee 10/11/2017  . S/P right knee arthroscopy 05/05/2017  . Ingrown toenail 04/12/2017  . Arthritis of right knee 01/27/2017  . Localization-related idiopathic epilepsy and epileptic syndromes with seizures of localized onset, not intractable, without status epilepticus ([place]) 09/09/2016  . Injury of left ankle and foot 07/13/2016  . 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 Essential hypertension 05/17/2016  . Right knee pain 04/23/2016  . Ankle sprain 04/01/2016  . Low back pain 01/27/2016  . Faintness 01/27/2016  . Transient alteration of awareness 01/27/2016  . Poison ivy dermatitis 03/11/2015  . 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 Epilepsy ([place]) 02/24/2015    Orientation RESPIRATION BLADDER Height & Weight     Self, Time, Situation, Place  Normal Continent, External catheter Weight: 162 lb 8 oz (73.7 kg) Height:  5\' 6"  (167.6 cm)  BEHAVIORAL SYMPTOMS/MOOD NEUROLOGICAL BOWEL NUTRITION STATUS      Continent Diet  AMBULATORY STATUS COMMUNICATION OF NEEDS Skin   Limited Assist Verbally Surgical wounds(R knee and R groin  closed incisions)                       Personal Care Assistance Level of Assistance  Bathing, Feeding, Dressing Bathing Assistance: Limited assistance Feeding assistance: Independent Dressing Assistance: Limited assistance     Functional Limitations Info  Sight, Hearing, Speech Sight Info: Adequate Hearing Info: Adequate Speech Info: Adequate    SPECIAL CARE FACTORS FREQUENCY  PT (By licensed PT), OT (By licensed OT)     PT Frequency: 5x/week OT Frequency: 5x/week            Contractures Contractures Info: Not present    Additional Factors Info  Code Status, Allergies, Psychotropic Code Status Info: Full Allergies Info: Erythromycin, Codeine Psychotropic Info: effexor         Current Medications (11/13/2017):  This is the current hospital active medication list Current Facility-Administered Medications  Medication Dose Route Frequency Provider Last Rate Last Dose  . acetaminophen (TYLENOL) tablet 650 mg  650 mg Oral Q4H PRN Mcarthur Rossetti, MD       Or  . acetaminophen (TYLENOL) suppository 650 mg  650 mg Rectal Q4H PRN Mcarthur Rossetti, MD      . ALPRAZolam Duanne Moron) tablet 0.5 mg  0.5 mg Oral TID PRN Mcarthur Rossetti, MD   0.5 mg at 11/12/17 2121  . alum & mag hydroxide-simeth (MAALOX/MYLANTA) 200-200-20 MG/5ML suspension 30 mL  30 mL Oral Q4H PRN Mcarthur Rossetti, MD   30 mL at 11/11/17 1946  . aspirin EC tablet 325 mg  325 mg Oral BID PC Mcarthur Rossetti, MD   325  mg at 11/13/17 1009  . atorvastatin (LIPITOR) tablet 40 mg  40 mg Oral Daily Mcarthur Rossetti, MD   40 mg at 11/13/17 1010  . diphenhydrAMINE (BENADRYL) 12.5 MG/5ML elixir 12.5-25 mg  12.5-25 mg Oral Q4H PRN Mcarthur Rossetti, MD      . docusate sodium (COLACE) capsule 100 mg  100 mg Oral BID Mcarthur Rossetti, MD   100 mg at 11/13/17 1009  . estrogens (conjugated) (PREMARIN) tablet 1.25 mg  1.25 mg Oral Daily Mcarthur Rossetti, MD   1.25  mg at 11/13/17 1010  . gabapentin (NEURONTIN) capsule 600 mg  600 mg Oral BID Mcarthur Rossetti, MD   600 mg at 11/13/17 1009  . hydrochlorothiazide (HYDRODIURIL) tablet 50 mg  50 mg Oral Daily Mcarthur Rossetti, MD   50 mg at 11/13/17 1009  . HYDROcodone-acetaminophen (NORCO/VICODIN) 5-325 MG per tablet 1-2 tablet  1-2 tablet Oral Q4H PRN Mcarthur Rossetti, MD   2 tablet at 11/13/17 1218  . HYDROmorphone (DILAUDID) injection 1 mg  1 mg Intravenous Q2H PRN Mcarthur Rossetti, MD   1 mg at 11/12/17 0543  . lisinopril (PRINIVIL,ZESTRIL) tablet 10 mg  10 mg Oral Daily Mcarthur Rossetti, MD   10 mg at 11/13/17 1009  . menthol-cetylpyridinium (CEPACOL) lozenge 3 mg  1 lozenge Oral PRN Mcarthur Rossetti, MD       Or  . phenol (CHLORASEPTIC) mouth spray 1 spray  1 spray Mouth/Throat PRN Mcarthur Rossetti, MD      . methocarbamol (ROBAXIN) tablet 500 mg  500 mg Oral Q6H PRN Mcarthur Rossetti, MD   500 mg at 11/13/17 0748   Or  . methocarbamol (ROBAXIN) 500 mg in dextrose 5 % 50 mL IVPB  500 mg Intravenous Q6H PRN Mcarthur Rossetti, MD 110 mL/hr at 11/11/17 1233 500 mg at 11/11/17 1233  . metoCLOPramide (REGLAN) tablet 5-10 mg  5-10 mg Oral Q8H PRN Mcarthur Rossetti, MD       Or  . metoCLOPramide (REGLAN) injection 5-10 mg  5-10 mg Intravenous Q8H PRN Mcarthur Rossetti, MD      . ondansetron [facility]) tablet 4 mg  4 mg Oral Q6H PRN Mcarthur Rossetti, MD       Or  . ondansetron [facility]) injection 4 mg  4 mg Intravenous Q6H PRN Mcarthur Rossetti, MD   4 mg at 11/12/17 1128  . oxyCODONE (Oxy IR/ROXICODONE) immediate release tablet 10 mg  10 mg Oral Q3H PRN Mcarthur Rossetti, MD   10 mg at 11/12/17 1943  . phenytoin (DILANTIN) ER capsule 300 mg  300 mg Oral Daily Mcarthur Rossetti, MD   300 mg at 11/13/17 1010  . polyethylene glycol (MIRALAX / GLYCOLAX) packet 17 g  17 g Oral Daily PRN Mcarthur Rossetti, MD      .  potassium chloride (K-DUR) CR tablet 40 mEq  40 mEq Oral BID WC Mcarthur Rossetti, MD   40 mEq at 11/13/17 0748  . venlafaxine XR (EFFEXOR-XR) 24 hr capsule 150 mg  150 mg Oral Q breakfast Mcarthur Rossetti, MD   150 mg at 11/13/17 2229     Discharge Medications: Please see discharge summary for a list of discharge medications.  Relevant Imaging Results:  Relevant Lab Results:   Additional Information SSN: [id number]  Estanislado Emms, LCSW

## 2017-11-13 NOTE — Care Management Note (Signed)
Case Management Note  Patient Details  Name: Allison Bass MRN: [id number] Date of Birth: September 29, 1941  Subjective/Objective:     Right TKA               Action/Plan: Discharge Planning: NCM spoke to pt and offered choice for [facility]. Pt agreeable to [facility]. HH was preoperatively arranged by surgeon's office. Contacted [facility] for RW and 3n1 bedside commode for home.   Expected Discharge Date:  11/20/17               Expected Discharge Plan:  [place]  In-House Referral:  NA  Discharge planning Services  CM Consult  Post Acute Care Choice:  Home Health Choice offered to:  Patient  DME Arranged:  3-N-1, Walker rolling DME Agency:  [place]:  PT [place] Agency:  [facility] ([facility])  Status of Service:  Completed, signed off  If discussed at [employer], dates discussed:    Additional Comments:  Erenest Rasher, RN 11/13/2017, 9:44 AM

## 2017-11-13 NOTE — Clinical Social Work Note (Signed)
Clinical Social Work Assessment  Patient Details  Name: Allison Bass MRN: [id number] Date of Birth: [birth year]  Date of referral:  11/13/17               Reason for consult:  Facility Placement                Permission sought to share information with:  Family Supports, Customer service manager Permission granted to share information::  Yes, Verbal Permission Granted  Name::     Kijuana Ruppel  Agency::  SNFs  Relationship::  son  Contact Information:  [phone]  Housing/Transportation Living arrangements for the past 2 months:  [place] of Information:  Patient, Adult Children Patient Interpreter Needed:  None Criminal Activity/Legal Involvement Pertinent to Current Situation/Hospitalization:  No - Comment as needed Significant Relationships:  Adult Children Lives with:  Self Do you feel safe going back to the place where you live?  Yes Need for family participation in patient care:  No (Coment)  Care giving concerns: Patient from home. PT recommending SNF after total knee arthroplasty.   Social Worker assessment / plan: CSW met with patient and family at bedside, including son, Clair Gulling. Patient and family agreeable to SNF. They are not familiar with facilities so had questions about discharge process and what the facilities are like. CSW answered all questions and explained discharge process. CSW to provide facility star ratings list. Two facilities have offered so far, CSW to follow up with additional offers Monday. CSW to follow and support with discharge.  Employment status:  Retired Forensic scientist:  Medicare PT Recommendations:  [place] / Referral to community resources:  [place]  Patient/Family's Response to care: Patient and family appreciative of care.  Patient/Family's Understanding of and Emotional Response to Diagnosis, Current Treatment, and Prognosis: Patient and family with good  understanding of patient's condition and agreeable to short term SNF placement for rehab.  Emotional Assessment Appearance:  Appears stated age Attitude/Demeanor/Rapport:  Engaged Affect (typically observed):  Calm, Pleasant Orientation:  Oriented to Self, Oriented to Place, Oriented to  Time, Oriented to Situation Alcohol / Substance use:  Not Applicable Psych involvement (Current and /or in the community):  No (Comment)  Discharge Needs  Concerns to be addressed:  Discharge Planning Concerns, Care Coordination Readmission within the last 30 days:  No Current discharge risk:  Physical Impairment Barriers to Discharge:  Continued Medical Work up   Estanislado Emms, LCSW 11/13/2017, 4:10 PM

## 2017-11-13 NOTE — Progress Notes (Signed)
Occupational Therapy Treatment Patient Details Name: Allison Bass MRN: [id number] DOB: 29-Dec-1940 Today's Date: 11/13/2017    History of present illness Pt s/p R TKR and with hx of CVA and sz   OT comments  Pt moving slowly toward goals.  She requires +3 assist to transfer from [facility] to recliner due to severity of pain this am.   She is now agreeable to SNF level rehab.    Follow Up Recommendations  SNF;Supervision/Assistance - 24 hour    Equipment Recommendations  None recommended by OT    Recommendations for Other Services      Precautions / Restrictions Precautions Precautions: Knee;Fall Required Braces or Orthoses: Knee Immobilizer - Right Knee Immobilizer - Right: Discontinue once straight leg raise with < 10 degree lag Restrictions Weight Bearing Restrictions: No RLE Weight Bearing: Weight bearing as tolerated       Mobility Bed Mobility               General bed mobility comments: on BSC with nursing   Transfers Overall transfer level: Needs assistance Equipment used: Rolling walker (2 wheeled) Transfers: Sit to/from [facility] Sit to Stand: Mod assist;+2 physical assistance(third person for safety ) Stand pivot transfers: Mod assist;+2 physical assistance(3rd person for safety )       General transfer comment: requires assist to move into standing, assist to pivot and to manage walker as well as intermittent assist managing Rt LE.  requires max cues for sequencing     Balance Overall balance assessment: Needs assistance Sitting-balance support: Feet supported Sitting balance-Leahy Scale: Fair     Standing balance support: Bilateral upper extremity supported Standing balance-Leahy Scale: Poor                             ADL either performed or assessed with clinical judgement   ADL Overall ADL's : Needs assistance/impaired                         Toilet Transfer: Moderate assistance;+2 for physical  assistance;RW;BSC Toilet Transfer Details (indicate cue type and reason): requires assist to move into standing, assist to pivot and to manage walker as well as intermittent assist managing Rt LE.  requires max cues for sequencing  Toileting- Clothing Manipulation and Hygiene: Maximal assistance;Sit to/from stand       Functional mobility during ADLs: Moderate assistance;+2 for physical assistance;Rolling walker       Vision       Perception     Praxis      Cognition Arousal/Alertness: Awake/alert Behavior During Therapy: Anxious Overall Cognitive Status: Within Functional Limits for tasks assessed                                 General Comments: [facility] for basic instructions.  per CNA, pt very confused last pm         Exercises     Shoulder Instructions       General Comments Discussed need for SNF, she is now agreeable     Pertinent Vitals/ Pain       Pain Assessment: Faces Faces Pain Scale: Hurts worst Pain Location: R knee Pain Descriptors / Indicators: Aching;Crying;Moaning;Operative site guarding;Grimacing;Guarding Pain Intervention(s): Monitored during session;Repositioned  Home Living  Prior Functioning/Environment              Frequency  Min 2X/week        Progress Toward Goals  OT Goals(current goals can now be found in the care plan section)  Progress towards OT goals: Not progressing toward goals - comment(limited by pain )     Plan Discharge plan remains appropriate    Co-evaluation                 AM-PAC PT "6 Clicks" Daily Activity     Outcome Measure   Help from another person eating meals?: None Help from another person taking care of personal grooming?: A Little Help from another person toileting, which includes using toliet, bedpan, or urinal?: A Lot Help from another person bathing (including washing, rinsing, drying)?: A Lot Help from another  person to put on and taking off regular upper body clothing?: A Little Help from another person to put on and taking off regular lower body clothing?: Total 6 Click Score: 15    End of Session Equipment Utilized During Treatment: Rolling walker;Gait belt  OT Visit Diagnosis: Unsteadiness on feet (R26.81);Pain Pain - Right/Left: Right Pain - part of body: Knee   Activity Tolerance Patient limited by pain   Patient Left in chair;with call bell/phone within reach;with chair alarm set   Nurse Communication Mobility status;Precautions        Time: 8177-1165 OT Time Calculation (min): 11 min  Charges: OT General Charges $OT Visit: 1 Visit OT Treatments $Self Care/Home Management : 8-22 mins  [facility], OTR/L [phone]    Lucille Passy M 11/13/2017, 8:44 AM

## 2017-11-13 NOTE — Progress Notes (Addendum)
PT recommended SNF, pt agreeable. CSW contacted for SNF placement. [facility] made aware. Jonnie Finner RN CCM Case Mgmt phone [phone]

## 2017-11-14 ENCOUNTER — Encounter (HOSPITAL_COMMUNITY): Payer: Self-pay | Admitting: Orthopaedic Surgery

## 2017-11-14 DIAGNOSIS — I10 Essential (primary) hypertension: Secondary | ICD-10-CM | POA: Diagnosis not present

## 2017-11-14 DIAGNOSIS — Q249 Congenital malformation of heart, unspecified: Secondary | ICD-10-CM | POA: Diagnosis not present

## 2017-11-14 DIAGNOSIS — Z96651 Presence of right artificial knee joint: Secondary | ICD-10-CM | POA: Diagnosis not present

## 2017-11-14 DIAGNOSIS — F329 Major depressive disorder, single episode, unspecified: Secondary | ICD-10-CM | POA: Diagnosis not present

## 2017-11-14 DIAGNOSIS — K219 Gastro-esophageal reflux disease without esophagitis: Secondary | ICD-10-CM | POA: Diagnosis not present

## 2017-11-14 DIAGNOSIS — Z8673 Personal history of transient ischemic attack (TIA), and cerebral infarction without residual deficits: Secondary | ICD-10-CM | POA: Diagnosis not present

## 2017-11-14 DIAGNOSIS — M1990 Unspecified osteoarthritis, unspecified site: Secondary | ICD-10-CM | POA: Diagnosis not present

## 2017-11-14 DIAGNOSIS — Z471 Aftercare following joint replacement surgery: Secondary | ICD-10-CM | POA: Diagnosis not present

## 2017-11-14 DIAGNOSIS — G8911 Acute pain due to trauma: Secondary | ICD-10-CM | POA: Diagnosis not present

## 2017-11-14 DIAGNOSIS — G40909 Epilepsy, unspecified, not intractable, without status epilepticus: Secondary | ICD-10-CM | POA: Diagnosis not present

## 2017-11-14 DIAGNOSIS — F419 Anxiety disorder, unspecified: Secondary | ICD-10-CM | POA: Diagnosis not present

## 2017-11-14 MED ORDER — METHOCARBAMOL 500 MG PO TABS: 500.0000 mg | ORAL_TABLET | Freq: Four times a day (QID) | ORAL | 0 refills | Status: DC | PRN

## 2017-11-14 MED ORDER — OXYCODONE HCL 5 MG PO TABS: 5.0000 mg | ORAL_TABLET | ORAL | 0 refills | Status: DC | PRN

## 2017-11-14 MED ORDER — ASPIRIN 325 MG PO TBEC: 325.0000 mg | DELAYED_RELEASE_TABLET | Freq: Two times a day (BID) | ORAL | 0 refills | Status: DC

## 2017-11-14 NOTE — Progress Notes (Signed)
Patient ID: Allison Bass, female   DOB: 1941-03-07, 77 y.o.   MRN: [id number] Due to very slow progress with mobility, therapy has recommended skilled nursing placement.  Her right knee is otherwise stable and vitals are stable.  Can be discharged to skilled nursing today.

## 2017-11-14 NOTE — Op Note (Signed)
NAME:  Bass, Allison                  ACCOUNT NO.:  MEDICAL RECORD NO.:  [id number]  LOCATION:                                 FACILITY:  PHYSICIAN:  Lind Guest. Ninfa Linden, M.D.DATE OF BIRTH:  [birth year]  DATE OF PROCEDURE:  11/11/2017 DATE OF DISCHARGE:                              OPERATIVE REPORT   REPORT TITLE:  Operative Note  BODY AFTER REPORT TITLE:  PREOPERATIVE DIAGNOSIS:  Primary osteoarthritis and degenerative joint disease with valgus malalignment for right knee.  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  PROCEDURE:  Right total knee arthroplasty.  IMPLANTS:  Stryker Triathlon knee with size 4 cemented femur, size 3 cemented tibial tray, 9-mm fixed-bearing polyethylene insert, size 29 cemented patellar button.  SURGEON:  Lind Guest. Ninfa Linden, M.D.  ASSISTANT:  Erskine Emery, PA-C.  ANESTHESIA: 1. Right lower extremity adductor canal block. 2. Spinal.  ANTIBIOTICS:  2 g of IV Ancef.  BLOOD LOSS:  Less than 200 mL.  TOURNIQUET TIME:  Less than 1 hour.  COMPLICATIONS:  None.  INDICATIONS:  Ms. Carver is a 77 year old female, well known to me.  She has valgus malalignment of her right knee and we have seen her for many years now.  We then performed an arthroscopic intervention of her knee, which shows significant cartilage loss in her knee.  Due to recurrent effusions and continued pain, as well as the detrimental effect, this has had on her activities of daily living, her quality of life and her mobility, she does wish to proceed with the total knee arthroplasty at this point.  She understands fully the risks of acute blood loss anemia, nerve and vessel injury, fracture, infection, and DVT.  She understands our goals are to decrease pain, improve mobility, and overall improved quality of life.  PROCEDURE DESCRIPTION:  After informed consent was obtained,  appropriate right knee was marked and an adductor canal block was obtained in the holding area.  She was brought to the operating room and placed supine on the operating table.  She was then sat up and spinal anesthesia was obtained.  She was laid in the supine position.  A Foley catheter was placed and a nonsterile tourniquet was placed around her upper right thigh.  Her right leg was then prepped and draped from the thigh down the ankle with DuraPrep and sterile drapes including a sterile stockinette.  A time-out was called and she was identified as correct patient and correct right knee.  I then used an Esmarch to wrap out the leg and tourniquet was inflated to 300 mm of pressure.  We then made a direct midline incision over the patella and carried this proximally and distally.  We dissected down the knee joint and carried out a medial parapatellar arthrotomy finding a very large joint effusion and significant arthritis in her knee.  With the knee in a flexed position, we removed remnants of the ACL, PCL, medial and lateral meniscus.  We put our tibia cutting block for taking 9 mm off the high side, using intramedullary alignment guide for then correcting for neutral slope and correcting for varus and  valgus.  We made our tibia cut without difficulty.  We then used an intramedullary guide through the notch for making our distal femoral cut.  We set this distal femoral cut for externally rotated knee for 8-mm distal femoral cut at 5 degrees externally rotated.  We made this cut without difficulty.  We brought the knee back down to full extension with a 9-mm extension block with same full extension.  We then went back to the femur and put our femoral sizing guide based off the epicondylar axis and chose a size 4 femur. We made our 4-in-1 cutting block for size 4 femur, followed by box cut. We did our chamfer cuts as well.  We then went back to the tibia and trialed a size 3 tibia for  coverage of the tibial tray, setting a rotation of the femur and tibial tubercle.  We made our keel cut off this.  With the size 3 tibial tray followed by the 4 femur, we tried a 9- mm trial insert and I was pleased with the range of motion and stability of the knee.  We then made our patellar cut and drilled 3 holes for size 29 patellar button.  We then removed all instrumentation from the knee and irrigated the knee with normal saline solution.  We mixed our cement and then cemented the real Stryker Triathlon tibial tray size 3 for right knee followed by the size 4 right femur.  We placed our 9-mm fix- bearing polyethylene insert and cemented our patellar button.  We removed the cement debris from the knee, and then once the cement had hardened, we let the tourniquet down.  Hemostasis was obtained with electrocautery.  We irrigated the knee again with normal saline using pulsatile lavage and closed the arthrotomy with interrupted #1 Vicryl suture followed by 0 Vicryl in the deep tissue, 2-0 Vicryl in the subcutaneous tissue, 4-0 Monocryl subcuticular stitch and Steri-Strips on the skin.  Well-padded sterile dressing was applied.  She was taken off the operating table and taken to the recovery room in stable condition.  All final counts were correct.  There were no complications noted.  Of note, Erskine Emery, PA-C, assisted in the entire case.  His assistance was crucial for facilitating all aspects of this case.     Lind Guest. Ninfa Linden, M.D.     CYB/MEDQ  D:  11/11/2017  T:  11/12/2017  Job:  [id number]

## 2017-11-14 NOTE — Clinical Social Work Placement (Addendum)
D/C Summary sent Med Nes. Complete.  Nurse given number for report.  [facility] to transport, Pick up arranged for 3:00pm  CLINICAL SOCIAL WORK PLACEMENT  NOTE  Date:  11/14/2017  Patient Details  Name: Allison Bass MRN: [id number] Date of Birth: 11/22/1940  Clinical Social Work is seeking post-discharge placement for this patient at the [place] level of care (*CSW will initial, date and re-position this form in  chart as items are completed):  Yes   Patient/family provided with [facility] Work Department's list of facilities offering this level of care within the geographic area requested by the patient (or if unable, by the patient's family).  Yes   Patient/family informed of their freedom to choose among providers that offer the needed level of care, that participate in Medicare, Medicaid or managed care program needed by the patient, have an available bed and are willing to accept the patient.  Yes   Patient/family informed of [facility]'s ownership interest in [facility] and [facility], as well as of the fact that they are under no obligation to receive care at these facilities.  PASRR submitted to EDS on       PASRR number received on       Existing PASRR number confirmed on 11/13/17     FL2 transmitted to all facilities in geographic area requested by pt/family on       FL2 transmitted to all facilities within larger geographic area on 11/13/17     Patient informed that his/her managed care company has contracts with or will negotiate with certain facilities, including the following:        Yes   Patient/family informed of bed offers received.  Patient chooses bed at [facility]     Physician recommends and patient chooses bed at      Patient to be transferred to [facility] on 11/14/17.  Patient to be transferred to facility by [facility]     Patient family notified on 11/14/17 of transfer.  Name of family member  notified:  Pt. notified family.      PHYSICIAN       Additional Comment:    _______________________________________________ Lia Hopping, LCSW 11/14/2017, 12:48 PM

## 2017-11-14 NOTE — Progress Notes (Signed)
Physical Therapy Treatment Patient Details Name: Allison Bass MRN: [id number] DOB: June 11, 1941 Today's Date: 11/14/2017    History of Present Illness Pt s/p R TKR and with hx of CVA and sz    PT Comments    Pt very cooperative and progressing with therex and mobility this am but with increased time required for all tasks.  Follow Up Recommendations  SNF     Equipment Recommendations  None recommended by PT    Recommendations for Other Services OT consult     Precautions / Restrictions Precautions Precautions: Knee;Fall Precaution Comments: instructed on KI use for amb  Required Braces or Orthoses: Knee Immobilizer - Right Knee Immobilizer - Right: Discontinue once straight leg raise with < 10 degree lag Restrictions Weight Bearing Restrictions: No RLE Weight Bearing: Weight bearing as tolerated Other Position/Activity Restrictions: WBAT    Mobility  Bed Mobility Overal bed mobility: Needs Assistance Bed Mobility: Supine to Sit     Supine to sit: Min assist     General bed mobility comments: Assist for R LE plus increased time  Transfers Overall transfer level: Needs assistance Equipment used: Rolling walker (2 wheeled) Transfers: Sit to/from Stand Sit to Stand: Min assist         General transfer comment: cues for LE management and use of UEs to self assist  Ambulation/Gait Ambulation/Gait assistance: Min assist Ambulation Distance (Feet): 22 Feet Assistive device: Rolling walker (2 wheeled) Gait Pattern/deviations: Step-to pattern;Trunk flexed;Antalgic Gait velocity: decr Gait velocity interpretation: Below normal speed for age/gender General Gait Details: Cues for sequence, posture and position from RW.  Chair along for safety but pt denies dizziness this am.   Stairs            Wheelchair Mobility    Modified Rankin (Stroke Patients Only)       Balance Overall balance assessment: Needs assistance Sitting-balance support: Feet  supported Sitting balance-Leahy Scale: Good     Standing balance support: Bilateral upper extremity supported Standing balance-Leahy Scale: Poor                              Cognition Arousal/Alertness: Awake/alert Behavior During Therapy: Anxious Overall Cognitive Status: Within Functional Limits for tasks assessed                                        Exercises Total Joint Exercises Ankle Circles/Pumps: AROM;Both;Supine;20 reps Quad Sets: AROM;Both;10 reps;Supine Heel Slides: AAROM;Right;Supine;15 reps Straight Leg Raises: AAROM;Right;Supine;20 reps Goniometric ROM: AAROM R knee -8 - 45    General Comments        Pertinent Vitals/Pain Pain Assessment: 0-10 Pain Score: 4  Pain Location: R knee Pain Descriptors / Indicators: Guarding;Sore;Aching Pain Intervention(s): Limited activity within patient's tolerance;Monitored during session;Premedicated before session;Ice applied    Home Living                      Prior Function            PT Goals (current goals can now be found in the care plan section) Acute Rehab PT Goals Patient Stated Goal: Regain IND PT Goal Formulation: With patient Time For Goal Achievement: 11/18/17 Potential to Achieve Goals: Good Progress towards PT goals: Progressing toward goals    Frequency    7X/week      PT Plan Current plan remains  appropriate    Co-evaluation              AM-PAC PT "6 Clicks" Daily Activity  Outcome Measure  Difficulty turning over in bed (including adjusting bedclothes, sheets and blankets)?: Unable Difficulty moving from lying on back to sitting on the side of the bed? : Unable Difficulty sitting down on and standing up from a chair with arms (e.g., wheelchair, bedside commode, etc,.)?: Unable Help needed moving to and from a bed to chair (including a wheelchair)?: A Lot Help needed walking in hospital room?: A Lot Help needed climbing 3-5 steps with a  railing? : Total 6 Click Score: 8    End of Session Equipment Utilized During Treatment: Gait belt;Right knee immobilizer Activity Tolerance: Patient limited by fatigue Patient left: in chair;with call bell/phone within reach Nurse Communication: Mobility status PT Visit Diagnosis: Difficulty in walking, not elsewhere classified (R26.2);Pain Pain - Right/Left: Right Pain - part of body: Knee     Time: 1104-1200 PT Time Calculation (min) (ACUTE ONLY): 56 min  Charges:  $Gait Training: 23-37 mins $Therapeutic Exercise: 8-22 mins $Therapeutic Activity: 8-22 mins                    G Codes:       Pg [phone]    Lyrica Mcclarty 11/14/2017, 12:50 PM

## 2017-11-14 NOTE — Progress Notes (Signed)
CSW and Renee w/ Ortho. discussed patient disposition. She reports the patient is agreeable to [facility] [facility].  CSW met with patient and confirmed choice. CSW confirm bed at facility.  Patient request to transport by PTAR. Patient informed family of SNF and transport.    Kathrin Greathouse, Latanya Presser, MSW Clinical Social Worker  [phone] 11/14/2017  12:45 PM

## 2017-11-14 NOTE — Discharge Summary (Signed)
Patient ID: Allison Bass MRN: [id number] DOB/AGE: [birth year] 75 y.o.  Admit date: 11/11/2017 Discharge date: 11/14/2017  Admission Diagnoses:  Principal Problem:   Unilateral primary osteoarthritis, right knee Active Problems:   Status post total right knee replacement   Discharge Diagnoses:  Same  Past Medical History:  Diagnosis Date  . Anxiety   . Arthritis   . Chicken pox   . Depression   . 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 GERD (gastroesophageal reflux disease)   . 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 HTN (hypertension)   . Migraine   . Pneumonia   . Seasonal allergies   . Seizures ([place])   . Stroke [facility])    2017    Surgeries: Procedure(s): RIGHT TOTAL KNEE ARTHROPLASTY on 11/11/2017   Consultants:   Discharged Condition: Improved  Hospital Course: Allison Bass is an 77 y.o. female who was admitted 11/11/2017 for operative treatment ofUnilateral primary osteoarthritis, right knee. Patient has severe unremitting pain that affects sleep, daily activities, and work/hobbies. After pre-op clearance the patient was taken to the operating room on 11/11/2017 and underwent  Procedure(s): RIGHT TOTAL KNEE ARTHROPLASTY.    Patient was given perioperative antibiotics:  Anti-infectives (From admission, onward)   Start     Dose/Rate Route Frequency Ordered Stop   11/11/17 1500  ceFAZolin (ANCEF) IVPB 1 g/50 mL premix     1 g 100 mL/hr over 30 Minutes Intravenous Every 6 hours 11/11/17 1416 11/11/17 2235   11/11/17 0705  ceFAZolin (ANCEF) IVPB 2g/100 mL premix     2 g 200 mL/hr over 30 Minutes Intravenous On call to O.R. 11/11/17 1025 11/11/17 0848       Patient was given sequential compression devices, early ambulation, and chemoprophylaxis to prevent DVT.  Patient benefited maximally from hospital stay and there were no complications.    Recent vital signs:  Patient Vitals for the past 24 hrs:  BP Temp Temp src  Pulse Resp SpO2  11/14/17 0628 (!) 100/48 99.1 F (37.3 C) Oral 82 15 96 %  11/13/17 2145 (!) 110/58 98.5 F (36.9 C) Oral - - -  11/13/17 2113 (!) 97/46 100.2 F (37.9 C) Oral 84 13 98 %  11/13/17 1306 (!) 117/51 98.3 F (36.8 C) Oral 96 16 96 %  11/13/17 1009 (!) 110/52 99.3 F (37.4 C) - 88 18 96 %  11/13/17 0800 (!) 134/50 - - 92 - -     Recent laboratory studies:  Recent Labs    11/12/17 0609 11/13/17 1203  WBC 13.0*  --   HGB 11.3*  --   HCT 33.3*  --   PLT 192  --   NA 129* 127*  K 2.9* 3.4*  CL 94* 91*  CO2 26 26  BUN 11 11  CREATININE 0.74 0.75  GLUCOSE 117* 101*  CALCIUM 8.1* 8.5*     Discharge Medications:   Allergies as of 11/14/2017      Reactions   Erythromycin Other (See Comments)   UNSPECIFIED REACTION    Codeine Nausea And Vomiting, Rash, Other (See Comments)   Makes the patient walk into walls      Medication List    TAKE these medications   acetaminophen 500 MG tablet Commonly known as:  TYLENOL Take 1,000 mg by mouth every 4 (four) hours as needed.   ALPRAZolam 0.5 MG tablet Commonly known as:  XANAX TAKE ONE TABLET BY MOUTH THREE  TIMES A DAY What changed:    how much to take  how to take this  when to take this   aspirin 325 MG EC tablet Take 1 tablet (325 mg total) by mouth 2 (two) times daily after a meal. What changed:    medication strength  how much to take  when to take this   atorvastatin 40 MG tablet Commonly known as:  LIPITOR Take 1 tablet (40 mg total) by mouth daily.   desvenlafaxine 100 MG 24 hr tablet Commonly known as:  PRISTIQ Take 1 tablet (100 mg total) by mouth daily.   DILANTIN 100 MG ER capsule Generic drug:  phenytoin TAKE THREE CAPSULES BY MOUTH ONE TIME DAILY What changed:    how much to take  how to take this  when to take this  additional instructions   gabapentin 300 MG capsule Commonly known as:  NEURONTIN Take 1 capsule (300 mg total) by mouth 3 (three) times daily. What  changed:    how much to take  when to take this   hydrochlorothiazide 50 MG tablet Commonly known as:  HYDRODIURIL TAKE 1 TABLET(50 MG) BY MOUTH DAILY   lisinopril 10 MG tablet Commonly known as:  PRINIVIL,ZESTRIL Take 1 tablet (10 mg total) by mouth daily.   MELATONIN PO Take 1 capsule by mouth at bedtime as needed (for sleep).   methocarbamol 500 MG tablet Commonly known as:  ROBAXIN Take 1 tablet (500 mg total) by mouth every 6 (six) hours as needed for muscle spasms.   NONFORMULARY OR COMPOUNDED ITEM [facility]:  Onychomycosis nail lacquer - Fluconazole 2%, Terbinafine 1%, DMSO appt to affected area daily.   oxyCODONE 5 MG immediate release tablet Commonly known as:  ROXICODONE Take 1-2 tablets (5-10 mg total) by mouth every 4 (four) hours as needed.   potassium chloride 10 MEQ tablet Commonly known as:  KLOR-CON 10 Take 2 tablets every morning and 2 tablets every evening. What changed:    how much to take  how to take this  when to take this  additional instructions   PREMARIN 1.25 MG tablet Generic drug:  estrogens (conjugated) TAKE ONE TABLET BY MOUTH ONE TIME DAILY What changed:    how much to take  how to take this  when to take this            Durable Medical Equipment  (From admission, onward)        Start     Ordered   11/11/17 1417  DME 3 n 1  Once     11/11/17 1416   11/11/17 1417  DME Walker rolling  Once    Question:  Patient needs a walker to treat with the following condition  Answer:  Status post total right knee replacement   11/11/17 1416      Diagnostic Studies: Dg Knee Right Port  Result Date: 11/11/2017 CLINICAL DATA:  Status post right total knee replacement. EXAM: PORTABLE RIGHT KNEE - 1-2 VIEW COMPARISON:  Radiographs of March 26, 2016. FINDINGS: The femoral and tibial components appear to be well situated. No fracture or dislocation is noted. Postoperative changes are noted in the soft tissues anteriorly.  IMPRESSION: Status post right total knee arthroplasty. Electronically Signed   By: Marijo Conception, M.D.   On: 11/11/2017 11:43    Disposition: to skilled nursing facility  Discharge Instructions    Discharge patient   Complete by:  As directed    Discharge disposition:  03-Skilled Nursing  Facility   Discharge patient date:  11/14/2017      Follow-up Information    [facility] Follow up.   Specialty:  [place] Why:  Home Health Physical Therapy -agency will call to arrange initial visit Contact information: [place] [address] [phone]        Mcarthur Rossetti, MD Follow up in 2 week(s).   Specialty:  Orthopedic Surgery Contact information: [address] [place] [address] [phone]            Signed: Mcarthur Rossetti 11/14/2017, 7:21 AM

## 2017-11-15 ENCOUNTER — Encounter (INDEPENDENT_AMBULATORY_CARE_PROVIDER_SITE_OTHER): Payer: Self-pay | Admitting: Orthopaedic Surgery

## 2017-11-15 NOTE — Telephone Encounter (Signed)
I contacted Ladell Heads, RN.  She will follow up with patient.

## 2017-11-16 DIAGNOSIS — B372 Candidiasis of skin and nail: Secondary | ICD-10-CM | POA: Diagnosis not present

## 2017-11-16 DIAGNOSIS — F339 Major depressive disorder, recurrent, unspecified: Secondary | ICD-10-CM | POA: Diagnosis not present

## 2017-11-16 DIAGNOSIS — R0989 Other specified symptoms and signs involving the circulatory and respiratory systems: Secondary | ICD-10-CM | POA: Diagnosis not present

## 2017-11-16 DIAGNOSIS — Q249 Congenital malformation of heart, unspecified: Secondary | ICD-10-CM | POA: Diagnosis not present

## 2017-11-16 DIAGNOSIS — F329 Major depressive disorder, single episode, unspecified: Secondary | ICD-10-CM | POA: Diagnosis not present

## 2017-11-16 DIAGNOSIS — J309 Allergic rhinitis, unspecified: Secondary | ICD-10-CM | POA: Diagnosis not present

## 2017-11-16 DIAGNOSIS — M1990 Unspecified osteoarthritis, unspecified site: Secondary | ICD-10-CM | POA: Diagnosis not present

## 2017-11-16 DIAGNOSIS — S8002XA Contusion of left knee, initial encounter: Secondary | ICD-10-CM | POA: Diagnosis not present

## 2017-11-16 DIAGNOSIS — G8911 Acute pain due to trauma: Secondary | ICD-10-CM | POA: Diagnosis not present

## 2017-11-16 DIAGNOSIS — Z8701 Personal history of pneumonia (recurrent): Secondary | ICD-10-CM | POA: Diagnosis not present

## 2017-11-16 DIAGNOSIS — R52 Pain, unspecified: Secondary | ICD-10-CM | POA: Diagnosis not present

## 2017-11-16 DIAGNOSIS — G40909 Epilepsy, unspecified, not intractable, without status epilepticus: Secondary | ICD-10-CM | POA: Diagnosis not present

## 2017-11-16 DIAGNOSIS — K219 Gastro-esophageal reflux disease without esophagitis: Secondary | ICD-10-CM | POA: Diagnosis not present

## 2017-11-16 DIAGNOSIS — I10 Essential (primary) hypertension: Secondary | ICD-10-CM | POA: Diagnosis not present

## 2017-11-16 DIAGNOSIS — R569 Unspecified convulsions: Secondary | ICD-10-CM | POA: Diagnosis not present

## 2017-11-16 DIAGNOSIS — Z96651 Presence of right artificial knee joint: Secondary | ICD-10-CM | POA: Diagnosis not present

## 2017-11-16 DIAGNOSIS — G43909 Migraine, unspecified, not intractable, without status migrainosus: Secondary | ICD-10-CM | POA: Diagnosis not present

## 2017-11-16 DIAGNOSIS — Z8673 Personal history of transient ischemic attack (TIA), and cerebral infarction without residual deficits: Secondary | ICD-10-CM | POA: Diagnosis not present

## 2017-11-16 DIAGNOSIS — Z471 Aftercare following joint replacement surgery: Secondary | ICD-10-CM | POA: Diagnosis not present

## 2017-11-16 DIAGNOSIS — R509 Fever, unspecified: Secondary | ICD-10-CM | POA: Diagnosis not present

## 2017-11-16 DIAGNOSIS — F418 Other specified anxiety disorders: Secondary | ICD-10-CM | POA: Diagnosis not present

## 2017-11-16 DIAGNOSIS — M1711 Unilateral primary osteoarthritis, right knee: Secondary | ICD-10-CM | POA: Diagnosis not present

## 2017-11-18 ENCOUNTER — Other Ambulatory Visit (HOSPITAL_COMMUNITY): Payer: Self-pay | Admitting: Interventional Radiology

## 2017-11-18 DIAGNOSIS — Z471 Aftercare following joint replacement surgery: Secondary | ICD-10-CM | POA: Diagnosis not present

## 2017-11-18 DIAGNOSIS — G40909 Epilepsy, unspecified, not intractable, without status epilepticus: Secondary | ICD-10-CM | POA: Diagnosis not present

## 2017-11-18 DIAGNOSIS — M1711 Unilateral primary osteoarthritis, right knee: Secondary | ICD-10-CM | POA: Diagnosis not present

## 2017-11-18 DIAGNOSIS — F339 Major depressive disorder, recurrent, unspecified: Secondary | ICD-10-CM | POA: Diagnosis not present

## 2017-11-18 DIAGNOSIS — I10 Essential (primary) hypertension: Secondary | ICD-10-CM | POA: Diagnosis not present

## 2017-11-21 DIAGNOSIS — F329 Major depressive disorder, single episode, unspecified: Secondary | ICD-10-CM | POA: Diagnosis not present

## 2017-11-21 DIAGNOSIS — B372 Candidiasis of skin and nail: Secondary | ICD-10-CM | POA: Diagnosis not present

## 2017-11-21 DIAGNOSIS — R52 Pain, unspecified: Secondary | ICD-10-CM | POA: Diagnosis not present

## 2017-11-23 ENCOUNTER — Ambulatory Visit (INDEPENDENT_AMBULATORY_CARE_PROVIDER_SITE_OTHER): Payer: Medicare Other | Admitting: Orthopaedic Surgery

## 2017-11-23 ENCOUNTER — Inpatient Hospital Stay (INDEPENDENT_AMBULATORY_CARE_PROVIDER_SITE_OTHER): Payer: Medicare Other | Admitting: Orthopaedic Surgery

## 2017-11-23 ENCOUNTER — Encounter (INDEPENDENT_AMBULATORY_CARE_PROVIDER_SITE_OTHER): Payer: Self-pay | Admitting: Orthopaedic Surgery

## 2017-11-23 DIAGNOSIS — Z96651 Presence of right artificial knee joint: Secondary | ICD-10-CM

## 2017-11-23 MED ORDER — OXYCODONE HCL 5 MG PO TABS: 5.0000 mg | ORAL_TABLET | ORAL | 0 refills | Status: AC | PRN

## 2017-11-23 MED ORDER — GABAPENTIN 300 MG PO CAPS: 300.0000 mg | ORAL_CAPSULE | Freq: Three times a day (TID) | ORAL | 1 refills | Status: DC

## 2017-11-23 NOTE — Progress Notes (Signed)
The patient is here in 2 weeks status post a right total knee arthroplasty.  She has been at a nursing care facility getting aggressive therapy there.  Now she is transitioning to home tomorrow.  She is going to have a few home therapy visits and we have encouraged her that outpatient therapy will be better for getting her knee aggressively moving including strengthen the muscles around the knee.  On exam her incision looks good and her mobility sutures in place new ones.  Her calf is soft.  She has almost full extension to about 90+ degrees flexion.  A long and thorough discussion about our goals with the surgery and again went over what we performed.  Her family is with her as well as a case Freight forwarder.  I did give her a prescription for oxycodone for when she is discharged home.  She will continue her Neurontin as well.  We will see her back in 4 weeks to see how she is progressing along from a mobility standpoint.  No x-rays needed at that visit.  All questions were encouraged and answered.

## 2017-11-25 ENCOUNTER — Encounter (INDEPENDENT_AMBULATORY_CARE_PROVIDER_SITE_OTHER): Payer: Self-pay | Admitting: Orthopaedic Surgery

## 2017-11-25 ENCOUNTER — Telehealth: Payer: Self-pay | Admitting: *Deleted

## 2017-11-25 DIAGNOSIS — Z471 Aftercare following joint replacement surgery: Secondary | ICD-10-CM | POA: Diagnosis not present

## 2017-11-25 DIAGNOSIS — F418 Other specified anxiety disorders: Secondary | ICD-10-CM | POA: Diagnosis not present

## 2017-11-25 DIAGNOSIS — I10 Essential (primary) hypertension: Secondary | ICD-10-CM | POA: Diagnosis not present

## 2017-11-25 DIAGNOSIS — F329 Major depressive disorder, single episode, unspecified: Secondary | ICD-10-CM | POA: Diagnosis not present

## 2017-11-25 DIAGNOSIS — G43909 Migraine, unspecified, not intractable, without status migrainosus: Secondary | ICD-10-CM | POA: Diagnosis not present

## 2017-11-25 DIAGNOSIS — G40009 Localization-related (focal) (partial) idiopathic epilepsy and epileptic syndromes with seizures of localized onset, not intractable, without status epilepticus: Secondary | ICD-10-CM | POA: Diagnosis not present

## 2017-11-25 NOTE — Telephone Encounter (Signed)
[facility] forwarded to provider/SLS 02/01

## 2017-11-26 ENCOUNTER — Encounter (INDEPENDENT_AMBULATORY_CARE_PROVIDER_SITE_OTHER): Payer: Self-pay | Admitting: Orthopaedic Surgery

## 2017-11-28 ENCOUNTER — Other Ambulatory Visit (INDEPENDENT_AMBULATORY_CARE_PROVIDER_SITE_OTHER): Payer: Self-pay

## 2017-11-28 MED ORDER — TRAMADOL HCL 50 MG PO TABS: 50.0000 mg | ORAL_TABLET | Freq: Four times a day (QID) | ORAL | 0 refills | Status: DC | PRN

## 2017-11-29 DIAGNOSIS — G40009 Localization-related (focal) (partial) idiopathic epilepsy and epileptic syndromes with seizures of localized onset, not intractable, without status epilepticus: Secondary | ICD-10-CM | POA: Diagnosis not present

## 2017-11-29 DIAGNOSIS — Z471 Aftercare following joint replacement surgery: Secondary | ICD-10-CM | POA: Diagnosis not present

## 2017-11-29 DIAGNOSIS — F329 Major depressive disorder, single episode, unspecified: Secondary | ICD-10-CM | POA: Diagnosis not present

## 2017-11-29 DIAGNOSIS — I10 Essential (primary) hypertension: Secondary | ICD-10-CM | POA: Diagnosis not present

## 2017-11-29 DIAGNOSIS — F418 Other specified anxiety disorders: Secondary | ICD-10-CM | POA: Diagnosis not present

## 2017-11-29 DIAGNOSIS — G43909 Migraine, unspecified, not intractable, without status migrainosus: Secondary | ICD-10-CM | POA: Diagnosis not present

## 2017-12-01 DIAGNOSIS — Z471 Aftercare following joint replacement surgery: Secondary | ICD-10-CM | POA: Diagnosis not present

## 2017-12-01 DIAGNOSIS — I10 Essential (primary) hypertension: Secondary | ICD-10-CM | POA: Diagnosis not present

## 2017-12-01 DIAGNOSIS — G43909 Migraine, unspecified, not intractable, without status migrainosus: Secondary | ICD-10-CM | POA: Diagnosis not present

## 2017-12-01 DIAGNOSIS — F418 Other specified anxiety disorders: Secondary | ICD-10-CM | POA: Diagnosis not present

## 2017-12-01 DIAGNOSIS — G40009 Localization-related (focal) (partial) idiopathic epilepsy and epileptic syndromes with seizures of localized onset, not intractable, without status epilepticus: Secondary | ICD-10-CM | POA: Diagnosis not present

## 2017-12-01 DIAGNOSIS — F329 Major depressive disorder, single episode, unspecified: Secondary | ICD-10-CM | POA: Diagnosis not present

## 2017-12-02 DIAGNOSIS — F418 Other specified anxiety disorders: Secondary | ICD-10-CM | POA: Diagnosis not present

## 2017-12-02 DIAGNOSIS — I10 Essential (primary) hypertension: Secondary | ICD-10-CM | POA: Diagnosis not present

## 2017-12-02 DIAGNOSIS — F329 Major depressive disorder, single episode, unspecified: Secondary | ICD-10-CM | POA: Diagnosis not present

## 2017-12-02 DIAGNOSIS — Z471 Aftercare following joint replacement surgery: Secondary | ICD-10-CM | POA: Diagnosis not present

## 2017-12-02 DIAGNOSIS — G40009 Localization-related (focal) (partial) idiopathic epilepsy and epileptic syndromes with seizures of localized onset, not intractable, without status epilepticus: Secondary | ICD-10-CM | POA: Diagnosis not present

## 2017-12-02 DIAGNOSIS — G43909 Migraine, unspecified, not intractable, without status migrainosus: Secondary | ICD-10-CM | POA: Diagnosis not present

## 2017-12-05 DIAGNOSIS — G43909 Migraine, unspecified, not intractable, without status migrainosus: Secondary | ICD-10-CM | POA: Diagnosis not present

## 2017-12-05 DIAGNOSIS — F418 Other specified anxiety disorders: Secondary | ICD-10-CM | POA: Diagnosis not present

## 2017-12-05 DIAGNOSIS — Z471 Aftercare following joint replacement surgery: Secondary | ICD-10-CM | POA: Diagnosis not present

## 2017-12-05 DIAGNOSIS — G40009 Localization-related (focal) (partial) idiopathic epilepsy and epileptic syndromes with seizures of localized onset, not intractable, without status epilepticus: Secondary | ICD-10-CM | POA: Diagnosis not present

## 2017-12-05 DIAGNOSIS — I10 Essential (primary) hypertension: Secondary | ICD-10-CM | POA: Diagnosis not present

## 2017-12-05 DIAGNOSIS — F329 Major depressive disorder, single episode, unspecified: Secondary | ICD-10-CM | POA: Diagnosis not present

## 2017-12-10 ENCOUNTER — Other Ambulatory Visit: Payer: Self-pay | Admitting: Family Medicine

## 2017-12-10 DIAGNOSIS — F419 Anxiety disorder, unspecified: Secondary | ICD-10-CM

## 2017-12-10 DIAGNOSIS — Z780 Asymptomatic menopausal state: Secondary | ICD-10-CM

## 2017-12-10 DIAGNOSIS — I10 Essential (primary) hypertension: Secondary | ICD-10-CM

## 2017-12-12 NOTE — Telephone Encounter (Signed)
Requesting:xanax Contract:no UDS:low risk next screen 06/15/17 Last OV:05/24/17 Next OV:not scheduled  Last Refill:09/13/17  #[id number]    Please advise

## 2017-12-13 DIAGNOSIS — M799 Soft tissue disorder, unspecified: Secondary | ICD-10-CM | POA: Diagnosis not present

## 2017-12-13 DIAGNOSIS — M25561 Pain in right knee: Secondary | ICD-10-CM | POA: Diagnosis not present

## 2017-12-13 DIAGNOSIS — M25461 Effusion, right knee: Secondary | ICD-10-CM | POA: Diagnosis not present

## 2017-12-13 DIAGNOSIS — Z471 Aftercare following joint replacement surgery: Secondary | ICD-10-CM | POA: Diagnosis not present

## 2017-12-15 DIAGNOSIS — Z471 Aftercare following joint replacement surgery: Secondary | ICD-10-CM | POA: Diagnosis not present

## 2017-12-15 DIAGNOSIS — M25461 Effusion, right knee: Secondary | ICD-10-CM | POA: Diagnosis not present

## 2017-12-15 DIAGNOSIS — M25561 Pain in right knee: Secondary | ICD-10-CM | POA: Diagnosis not present

## 2017-12-15 DIAGNOSIS — M799 Soft tissue disorder, unspecified: Secondary | ICD-10-CM | POA: Diagnosis not present

## 2017-12-16 DIAGNOSIS — M25461 Effusion, right knee: Secondary | ICD-10-CM | POA: Diagnosis not present

## 2017-12-16 DIAGNOSIS — Z471 Aftercare following joint replacement surgery: Secondary | ICD-10-CM | POA: Diagnosis not present

## 2017-12-16 DIAGNOSIS — M25561 Pain in right knee: Secondary | ICD-10-CM | POA: Diagnosis not present

## 2017-12-16 DIAGNOSIS — M799 Soft tissue disorder, unspecified: Secondary | ICD-10-CM | POA: Diagnosis not present

## 2017-12-19 ENCOUNTER — Other Ambulatory Visit (INDEPENDENT_AMBULATORY_CARE_PROVIDER_SITE_OTHER): Payer: Self-pay | Admitting: Orthopaedic Surgery

## 2017-12-19 ENCOUNTER — Telehealth (INDEPENDENT_AMBULATORY_CARE_PROVIDER_SITE_OTHER): Payer: Self-pay

## 2017-12-19 DIAGNOSIS — M25561 Pain in right knee: Secondary | ICD-10-CM | POA: Diagnosis not present

## 2017-12-19 DIAGNOSIS — Z471 Aftercare following joint replacement surgery: Secondary | ICD-10-CM | POA: Diagnosis not present

## 2017-12-19 DIAGNOSIS — M799 Soft tissue disorder, unspecified: Secondary | ICD-10-CM | POA: Diagnosis not present

## 2017-12-19 DIAGNOSIS — M25461 Effusion, right knee: Secondary | ICD-10-CM | POA: Diagnosis not present

## 2017-12-19 MED ORDER — HYDROCODONE-ACETAMINOPHEN 5-325 MG PO TABS: 1.0000 | ORAL_TABLET | Freq: Four times a day (QID) | ORAL | 0 refills | Status: DC | PRN

## 2017-12-19 NOTE — Telephone Encounter (Signed)
See below

## 2017-12-19 NOTE — Telephone Encounter (Signed)
Patient called stating that she can not take Tramadol or Oxycodone.  Would like a Rx for Hydrocodone 5mg -325.  Patient states that it's painful to due physical therapy.  Cb# is [phone].  Thank you.  Please advise.

## 2017-12-19 NOTE — Telephone Encounter (Signed)
I sent some into [facility]

## 2017-12-20 DIAGNOSIS — M799 Soft tissue disorder, unspecified: Secondary | ICD-10-CM | POA: Diagnosis not present

## 2017-12-20 DIAGNOSIS — M25561 Pain in right knee: Secondary | ICD-10-CM | POA: Diagnosis not present

## 2017-12-20 DIAGNOSIS — M25461 Effusion, right knee: Secondary | ICD-10-CM | POA: Diagnosis not present

## 2017-12-20 DIAGNOSIS — Z471 Aftercare following joint replacement surgery: Secondary | ICD-10-CM | POA: Diagnosis not present

## 2017-12-21 ENCOUNTER — Ambulatory Visit (INDEPENDENT_AMBULATORY_CARE_PROVIDER_SITE_OTHER): Payer: Medicare Other | Admitting: Orthopaedic Surgery

## 2017-12-22 ENCOUNTER — Ambulatory Visit (INDEPENDENT_AMBULATORY_CARE_PROVIDER_SITE_OTHER): Payer: Medicare Other | Admitting: Orthopaedic Surgery

## 2017-12-22 ENCOUNTER — Telehealth (INDEPENDENT_AMBULATORY_CARE_PROVIDER_SITE_OTHER): Payer: Self-pay | Admitting: Orthopaedic Surgery

## 2017-12-22 ENCOUNTER — Encounter (INDEPENDENT_AMBULATORY_CARE_PROVIDER_SITE_OTHER): Payer: Self-pay | Admitting: Orthopaedic Surgery

## 2017-12-22 DIAGNOSIS — Z471 Aftercare following joint replacement surgery: Secondary | ICD-10-CM | POA: Diagnosis not present

## 2017-12-22 DIAGNOSIS — M25561 Pain in right knee: Secondary | ICD-10-CM | POA: Diagnosis not present

## 2017-12-22 DIAGNOSIS — M25461 Effusion, right knee: Secondary | ICD-10-CM | POA: Diagnosis not present

## 2017-12-22 DIAGNOSIS — M799 Soft tissue disorder, unspecified: Secondary | ICD-10-CM | POA: Diagnosis not present

## 2017-12-22 DIAGNOSIS — Z96651 Presence of right artificial knee joint: Secondary | ICD-10-CM

## 2017-12-22 MED ORDER — HYDROCODONE-ACETAMINOPHEN 5-325 MG PO TABS: 1.0000 | ORAL_TABLET | Freq: Four times a day (QID) | ORAL | 0 refills | Status: DC | PRN

## 2017-12-22 NOTE — Telephone Encounter (Signed)
Megan from [facility] is requesting a RX for PT for her knee, just wanting it on file. If it could be faxed to [phone] Attn: Jinny Blossom

## 2017-12-22 NOTE — Progress Notes (Signed)
HPI: Allison Bass returns today follow-up status post right total knee arthroplasty.  States she still has a lot of discomfort especially in the morning.  She is working with physical therapy she is flexing her knee anywhere from 82 degrees to 98 degrees at best.  Taking hydrocodone particularly at night.  Had no shortness of breath fevers chills chest pain.  She is continues to ambulate with a cane.  Physical exam: Right knee full extension flexion passively to 90 degrees.  Surgical incisions healed well without any signs of infection.  Calf supple nontender.  No instability valgus varus stressing the knee.  No abnormal warmth erythema or ecchymosis.  Impression: 41 days status post right total knee arthroplasty  Plan she is given a refill on her hydrocodone.  We will see her back in 2 weeks check her progress lack of.  Did discuss with her if she continues to be unable to flex beyond 90 degrees ideally 110 degrees then she may require manipulation of the knee under anesthesia.  Did discuss ways for her to improve her range of motion at home.  She will continue to work with physical therapy to work on range of motion strengthening knee.

## 2017-12-23 ENCOUNTER — Telehealth (INDEPENDENT_AMBULATORY_CARE_PROVIDER_SITE_OTHER): Payer: Self-pay

## 2017-12-23 NOTE — Telephone Encounter (Signed)
New PT order faxed to [facility] by Anise Salvo.  See her notes.

## 2017-12-23 NOTE — Telephone Encounter (Signed)
Faxed to provided fax number

## 2017-12-24 ENCOUNTER — Encounter (INDEPENDENT_AMBULATORY_CARE_PROVIDER_SITE_OTHER): Payer: Self-pay | Admitting: Orthopaedic Surgery

## 2017-12-26 DIAGNOSIS — M25461 Effusion, right knee: Secondary | ICD-10-CM | POA: Diagnosis not present

## 2017-12-26 DIAGNOSIS — M799 Soft tissue disorder, unspecified: Secondary | ICD-10-CM | POA: Diagnosis not present

## 2017-12-26 DIAGNOSIS — M25561 Pain in right knee: Secondary | ICD-10-CM | POA: Diagnosis not present

## 2017-12-26 DIAGNOSIS — Z471 Aftercare following joint replacement surgery: Secondary | ICD-10-CM | POA: Diagnosis not present

## 2017-12-28 DIAGNOSIS — M25461 Effusion, right knee: Secondary | ICD-10-CM | POA: Diagnosis not present

## 2017-12-28 DIAGNOSIS — Z471 Aftercare following joint replacement surgery: Secondary | ICD-10-CM | POA: Diagnosis not present

## 2017-12-28 DIAGNOSIS — M799 Soft tissue disorder, unspecified: Secondary | ICD-10-CM | POA: Diagnosis not present

## 2017-12-28 DIAGNOSIS — M25561 Pain in right knee: Secondary | ICD-10-CM | POA: Diagnosis not present

## 2018-01-02 DIAGNOSIS — Z471 Aftercare following joint replacement surgery: Secondary | ICD-10-CM | POA: Diagnosis not present

## 2018-01-02 DIAGNOSIS — M25461 Effusion, right knee: Secondary | ICD-10-CM | POA: Diagnosis not present

## 2018-01-02 DIAGNOSIS — M25561 Pain in right knee: Secondary | ICD-10-CM | POA: Diagnosis not present

## 2018-01-02 DIAGNOSIS — M799 Soft tissue disorder, unspecified: Secondary | ICD-10-CM | POA: Diagnosis not present

## 2018-01-04 DIAGNOSIS — M25561 Pain in right knee: Secondary | ICD-10-CM | POA: Diagnosis not present

## 2018-01-04 DIAGNOSIS — M25461 Effusion, right knee: Secondary | ICD-10-CM | POA: Diagnosis not present

## 2018-01-04 DIAGNOSIS — Z471 Aftercare following joint replacement surgery: Secondary | ICD-10-CM | POA: Diagnosis not present

## 2018-01-04 DIAGNOSIS — M799 Soft tissue disorder, unspecified: Secondary | ICD-10-CM | POA: Diagnosis not present

## 2018-01-05 ENCOUNTER — Ambulatory Visit (INDEPENDENT_AMBULATORY_CARE_PROVIDER_SITE_OTHER): Payer: Medicare Other | Admitting: Orthopaedic Surgery

## 2018-01-06 DIAGNOSIS — M799 Soft tissue disorder, unspecified: Secondary | ICD-10-CM | POA: Diagnosis not present

## 2018-01-06 DIAGNOSIS — M25561 Pain in right knee: Secondary | ICD-10-CM | POA: Diagnosis not present

## 2018-01-06 DIAGNOSIS — M25461 Effusion, right knee: Secondary | ICD-10-CM | POA: Diagnosis not present

## 2018-01-06 DIAGNOSIS — Z471 Aftercare following joint replacement surgery: Secondary | ICD-10-CM | POA: Diagnosis not present

## 2018-01-09 DIAGNOSIS — M25461 Effusion, right knee: Secondary | ICD-10-CM | POA: Diagnosis not present

## 2018-01-09 DIAGNOSIS — M25561 Pain in right knee: Secondary | ICD-10-CM | POA: Diagnosis not present

## 2018-01-09 DIAGNOSIS — Z471 Aftercare following joint replacement surgery: Secondary | ICD-10-CM | POA: Diagnosis not present

## 2018-01-09 DIAGNOSIS — M799 Soft tissue disorder, unspecified: Secondary | ICD-10-CM | POA: Diagnosis not present

## 2018-01-10 ENCOUNTER — Ambulatory Visit (INDEPENDENT_AMBULATORY_CARE_PROVIDER_SITE_OTHER): Payer: Medicare Other | Admitting: Physician Assistant

## 2018-01-11 ENCOUNTER — Telehealth (HOSPITAL_COMMUNITY): Payer: Self-pay

## 2018-01-11 DIAGNOSIS — Z471 Aftercare following joint replacement surgery: Secondary | ICD-10-CM | POA: Diagnosis not present

## 2018-01-11 DIAGNOSIS — M25461 Effusion, right knee: Secondary | ICD-10-CM | POA: Diagnosis not present

## 2018-01-11 DIAGNOSIS — M25561 Pain in right knee: Secondary | ICD-10-CM | POA: Diagnosis not present

## 2018-01-11 DIAGNOSIS — M799 Soft tissue disorder, unspecified: Secondary | ICD-10-CM | POA: Diagnosis not present

## 2018-01-11 NOTE — Telephone Encounter (Signed)
Called to schedule f/u mri, left message for pt to return call. AW 

## 2018-01-13 DIAGNOSIS — M799 Soft tissue disorder, unspecified: Secondary | ICD-10-CM | POA: Diagnosis not present

## 2018-01-13 DIAGNOSIS — M25461 Effusion, right knee: Secondary | ICD-10-CM | POA: Diagnosis not present

## 2018-01-13 DIAGNOSIS — Z471 Aftercare following joint replacement surgery: Secondary | ICD-10-CM | POA: Diagnosis not present

## 2018-01-13 DIAGNOSIS — M25561 Pain in right knee: Secondary | ICD-10-CM | POA: Diagnosis not present

## 2018-01-16 DIAGNOSIS — M25461 Effusion, right knee: Secondary | ICD-10-CM | POA: Diagnosis not present

## 2018-01-16 DIAGNOSIS — M799 Soft tissue disorder, unspecified: Secondary | ICD-10-CM | POA: Diagnosis not present

## 2018-01-16 DIAGNOSIS — M25561 Pain in right knee: Secondary | ICD-10-CM | POA: Diagnosis not present

## 2018-01-16 DIAGNOSIS — Z471 Aftercare following joint replacement surgery: Secondary | ICD-10-CM | POA: Diagnosis not present

## 2018-01-17 ENCOUNTER — Telehealth (HOSPITAL_COMMUNITY): Payer: Self-pay

## 2018-01-17 NOTE — Telephone Encounter (Signed)
Returned pt's call. She would like to wait a couple weeks before she schedules her MRI. She wants to finish with her physical therapy before the scan. She will call when she is done and ready to schedule. AW

## 2018-01-18 DIAGNOSIS — Z471 Aftercare following joint replacement surgery: Secondary | ICD-10-CM | POA: Diagnosis not present

## 2018-01-18 DIAGNOSIS — M799 Soft tissue disorder, unspecified: Secondary | ICD-10-CM | POA: Diagnosis not present

## 2018-01-18 DIAGNOSIS — M25461 Effusion, right knee: Secondary | ICD-10-CM | POA: Diagnosis not present

## 2018-01-18 DIAGNOSIS — M25561 Pain in right knee: Secondary | ICD-10-CM | POA: Diagnosis not present

## 2018-01-23 DIAGNOSIS — Z471 Aftercare following joint replacement surgery: Secondary | ICD-10-CM | POA: Diagnosis not present

## 2018-01-23 DIAGNOSIS — M25561 Pain in right knee: Secondary | ICD-10-CM | POA: Diagnosis not present

## 2018-01-23 DIAGNOSIS — M25461 Effusion, right knee: Secondary | ICD-10-CM | POA: Diagnosis not present

## 2018-01-23 DIAGNOSIS — M799 Soft tissue disorder, unspecified: Secondary | ICD-10-CM | POA: Diagnosis not present

## 2018-01-25 DIAGNOSIS — M799 Soft tissue disorder, unspecified: Secondary | ICD-10-CM | POA: Diagnosis not present

## 2018-01-25 DIAGNOSIS — Z471 Aftercare following joint replacement surgery: Secondary | ICD-10-CM | POA: Diagnosis not present

## 2018-01-25 DIAGNOSIS — M25461 Effusion, right knee: Secondary | ICD-10-CM | POA: Diagnosis not present

## 2018-01-25 DIAGNOSIS — M25561 Pain in right knee: Secondary | ICD-10-CM | POA: Diagnosis not present

## 2018-01-26 ENCOUNTER — Encounter (INDEPENDENT_AMBULATORY_CARE_PROVIDER_SITE_OTHER): Payer: Self-pay | Admitting: Physician Assistant

## 2018-01-26 ENCOUNTER — Ambulatory Visit (INDEPENDENT_AMBULATORY_CARE_PROVIDER_SITE_OTHER): Payer: Medicare Other | Admitting: Physician Assistant

## 2018-01-26 DIAGNOSIS — Z96651 Presence of right artificial knee joint: Secondary | ICD-10-CM

## 2018-01-26 MED ORDER — GABAPENTIN 300 MG PO CAPS: 300.0000 mg | ORAL_CAPSULE | Freq: Three times a day (TID) | ORAL | 3 refills | Status: DC

## 2018-01-26 MED ORDER — HYDROCODONE-ACETAMINOPHEN 5-325 MG PO TABS: 1.0000 | ORAL_TABLET | Freq: Four times a day (QID) | ORAL | 0 refills | Status: DC | PRN

## 2018-01-26 NOTE — Progress Notes (Signed)
HPI: [name] returns today 7 6 days status post right total knee arthroplasty.  She states overall she is doing very well.  She states her range of motion is really improved.  She is doing physical therapy 3 times a week and would like to continue therapy.  She is taking Norco and is on gabapentin and finds these medicines are helpful.  She did like a refill on both.  Physical exam: Right knee she has full extension flexion to 110 degrees no instability valgus varus stressing.  Surgical incisions well-healed no signs of infection.  Calf supple nontender.  Impression: Status post right total knee arthroplasty  Plan: Continue to work on range of motion strengthening the knee.  She will continue her gabapentin and is told not to discontinue this abruptly.  She is also given a refill on her Norco.  She is to use this sparingly.  She will follow-up with [place] in 3 months to check her progress or lack of.  Questions encouraged and answered at length today.

## 2018-01-27 DIAGNOSIS — M25561 Pain in right knee: Secondary | ICD-10-CM | POA: Diagnosis not present

## 2018-01-27 DIAGNOSIS — M25461 Effusion, right knee: Secondary | ICD-10-CM | POA: Diagnosis not present

## 2018-01-27 DIAGNOSIS — Z471 Aftercare following joint replacement surgery: Secondary | ICD-10-CM | POA: Diagnosis not present

## 2018-01-27 DIAGNOSIS — M799 Soft tissue disorder, unspecified: Secondary | ICD-10-CM | POA: Diagnosis not present

## 2018-01-30 DIAGNOSIS — M799 Soft tissue disorder, unspecified: Secondary | ICD-10-CM | POA: Diagnosis not present

## 2018-01-30 DIAGNOSIS — Z471 Aftercare following joint replacement surgery: Secondary | ICD-10-CM | POA: Diagnosis not present

## 2018-01-30 DIAGNOSIS — M25561 Pain in right knee: Secondary | ICD-10-CM | POA: Diagnosis not present

## 2018-01-30 DIAGNOSIS — M25461 Effusion, right knee: Secondary | ICD-10-CM | POA: Diagnosis not present

## 2018-02-01 DIAGNOSIS — M799 Soft tissue disorder, unspecified: Secondary | ICD-10-CM | POA: Diagnosis not present

## 2018-02-01 DIAGNOSIS — M25561 Pain in right knee: Secondary | ICD-10-CM | POA: Diagnosis not present

## 2018-02-01 DIAGNOSIS — Z471 Aftercare following joint replacement surgery: Secondary | ICD-10-CM | POA: Diagnosis not present

## 2018-02-01 DIAGNOSIS — M25461 Effusion, right knee: Secondary | ICD-10-CM | POA: Diagnosis not present

## 2018-02-03 DIAGNOSIS — M799 Soft tissue disorder, unspecified: Secondary | ICD-10-CM | POA: Diagnosis not present

## 2018-02-03 DIAGNOSIS — M25461 Effusion, right knee: Secondary | ICD-10-CM | POA: Diagnosis not present

## 2018-02-03 DIAGNOSIS — M25561 Pain in right knee: Secondary | ICD-10-CM | POA: Diagnosis not present

## 2018-02-03 DIAGNOSIS — Z471 Aftercare following joint replacement surgery: Secondary | ICD-10-CM | POA: Diagnosis not present

## 2018-02-06 DIAGNOSIS — M799 Soft tissue disorder, unspecified: Secondary | ICD-10-CM | POA: Diagnosis not present

## 2018-02-06 DIAGNOSIS — M25461 Effusion, right knee: Secondary | ICD-10-CM | POA: Diagnosis not present

## 2018-02-06 DIAGNOSIS — M25561 Pain in right knee: Secondary | ICD-10-CM | POA: Diagnosis not present

## 2018-02-06 DIAGNOSIS — Z471 Aftercare following joint replacement surgery: Secondary | ICD-10-CM | POA: Diagnosis not present

## 2018-02-13 DIAGNOSIS — Z471 Aftercare following joint replacement surgery: Secondary | ICD-10-CM | POA: Diagnosis not present

## 2018-02-13 DIAGNOSIS — M25561 Pain in right knee: Secondary | ICD-10-CM | POA: Diagnosis not present

## 2018-02-13 DIAGNOSIS — M799 Soft tissue disorder, unspecified: Secondary | ICD-10-CM | POA: Diagnosis not present

## 2018-02-13 DIAGNOSIS — M25461 Effusion, right knee: Secondary | ICD-10-CM | POA: Diagnosis not present

## 2018-02-16 DIAGNOSIS — M25561 Pain in right knee: Secondary | ICD-10-CM | POA: Diagnosis not present

## 2018-02-16 DIAGNOSIS — M799 Soft tissue disorder, unspecified: Secondary | ICD-10-CM | POA: Diagnosis not present

## 2018-02-16 DIAGNOSIS — M25461 Effusion, right knee: Secondary | ICD-10-CM | POA: Diagnosis not present

## 2018-02-16 DIAGNOSIS — Z471 Aftercare following joint replacement surgery: Secondary | ICD-10-CM | POA: Diagnosis not present

## 2018-02-20 DIAGNOSIS — Z471 Aftercare following joint replacement surgery: Secondary | ICD-10-CM | POA: Diagnosis not present

## 2018-02-20 DIAGNOSIS — M799 Soft tissue disorder, unspecified: Secondary | ICD-10-CM | POA: Diagnosis not present

## 2018-02-20 DIAGNOSIS — M25561 Pain in right knee: Secondary | ICD-10-CM | POA: Diagnosis not present

## 2018-02-20 DIAGNOSIS — M25461 Effusion, right knee: Secondary | ICD-10-CM | POA: Diagnosis not present

## 2018-02-24 DIAGNOSIS — M25461 Effusion, right knee: Secondary | ICD-10-CM | POA: Diagnosis not present

## 2018-02-24 DIAGNOSIS — M25561 Pain in right knee: Secondary | ICD-10-CM | POA: Diagnosis not present

## 2018-02-24 DIAGNOSIS — Z471 Aftercare following joint replacement surgery: Secondary | ICD-10-CM | POA: Diagnosis not present

## 2018-02-24 DIAGNOSIS — M799 Soft tissue disorder, unspecified: Secondary | ICD-10-CM | POA: Diagnosis not present

## 2018-03-02 ENCOUNTER — Other Ambulatory Visit: Payer: Self-pay | Admitting: Family Medicine

## 2018-03-02 DIAGNOSIS — Z79899 Other long term (current) drug therapy: Secondary | ICD-10-CM

## 2018-03-02 DIAGNOSIS — F419 Anxiety disorder, unspecified: Secondary | ICD-10-CM

## 2018-03-03 NOTE — Telephone Encounter (Signed)
Will need uds and contract

## 2018-03-03 NOTE — Telephone Encounter (Signed)
Database ran and is on your desk for review.  Last filled per database: 02/10/18 Last written: 12/12/17 Last ov: 05/24/18 Next ov: none Contract: none [id number]

## 2018-03-07 NOTE — Addendum Note (Signed)
Addended by: Kem Boroughs D on: 03/07/2018 08:47 AM   Modules accepted: Orders

## 2018-03-07 NOTE — Telephone Encounter (Signed)
Left detailed message on phone that she must come in to do UDS and contract before next refill. Future order placed.

## 2018-03-08 ENCOUNTER — Other Ambulatory Visit: Payer: Medicare Other

## 2018-03-08 DIAGNOSIS — F419 Anxiety disorder, unspecified: Secondary | ICD-10-CM

## 2018-03-08 DIAGNOSIS — Z79899 Other long term (current) drug therapy: Secondary | ICD-10-CM | POA: Diagnosis not present

## 2018-03-08 NOTE — Telephone Encounter (Signed)
Pt presented today for UDS and contract. Contract was printed and given to pt. Pt had some concerns with contract. States that she is not going to ask her PCP if it's ok for another Provider to prescribe her narcotics / controlled substances. I explained to pt that she will need to notify us in these situations for her safety and compliance with the contract. Pt states " I will notify her when she needs to collect a urine sample". I again stated reasons she should comply. She states she sees Providers in [place] and we should be able to see what they prescribe. Pt then read that Rx would not be replaced if med was lost or stolen and states "what the h*ll is that about, I would still need my medicine". Pt apologized at the end stating she was not angry at me and thanked me for my time. Pt asked if she had a written rx here and I advised her per Epic that refill was sent to pharmacy on 03/03/18.

## 2018-03-10 ENCOUNTER — Other Ambulatory Visit: Payer: Self-pay | Admitting: Family Medicine

## 2018-03-10 DIAGNOSIS — F419 Anxiety disorder, unspecified: Secondary | ICD-10-CM

## 2018-03-10 DIAGNOSIS — G40909 Epilepsy, unspecified, not intractable, without status epilepticus: Secondary | ICD-10-CM

## 2018-03-10 DIAGNOSIS — Z780 Asymptomatic menopausal state: Secondary | ICD-10-CM

## 2018-03-11 LAB — PAIN MGMT, PROFILE 8 W/CONF, U
6 Acetylmorphine: NEGATIVE ng/mL (ref <10)
ALPHAHYDROXYALPRAZOLAM: 136 ng/mL — AB (ref <25)
ALPHAHYDROXYMIDAZOLAM: NEGATIVE ng/mL (ref <50)
AMPHETAMINES: NEGATIVE ng/mL (ref <500)
Alcohol Metabolites: NEGATIVE ng/mL (ref <500)
Alphahydroxytriazolam: NEGATIVE ng/mL (ref <50)
Aminoclonazepam: NEGATIVE ng/mL (ref <25)
Benzodiazepines: POSITIVE ng/mL — AB (ref <100)
Buprenorphine, Urine: NEGATIVE ng/mL (ref <5)
Cocaine Metabolite: NEGATIVE ng/mL (ref <150)
Creatinine: 61.8 mg/dL
Hydroxyethylflurazepam: NEGATIVE ng/mL (ref <50)
LORAZEPAM: NEGATIVE ng/mL (ref <50)
MDMA: NEGATIVE ng/mL (ref <500)
Marijuana Metabolite: NEGATIVE ng/mL (ref <20)
Nordiazepam: NEGATIVE ng/mL (ref <50)
OPIATES: NEGATIVE ng/mL (ref <100)
OXIDANT: NEGATIVE ug/mL (ref <200)
OXYCODONE: NEGATIVE ng/mL (ref <100)
Oxazepam: NEGATIVE ng/mL (ref <50)
PH: 6.84 (ref 4.5–9.0)
Temazepam: NEGATIVE ng/mL (ref <50)

## 2018-03-27 ENCOUNTER — Other Ambulatory Visit: Payer: Self-pay | Admitting: Student

## 2018-03-27 ENCOUNTER — Telehealth: Payer: Self-pay | Admitting: Student

## 2018-03-27 ENCOUNTER — Other Ambulatory Visit (HOSPITAL_COMMUNITY): Payer: Self-pay | Admitting: Interventional Radiology

## 2018-03-27 DIAGNOSIS — D1800 Hemangioma unspecified site: Secondary | ICD-10-CM

## 2018-03-27 NOTE — Telephone Encounter (Addendum)
Received message from patient regarding MRI anxiety. Patient with history left ICA intracranial aneurysm that has been monitored by Dr. Estanislado Pandy with routine imaging scans. Patient scheduled for MRI 04/05/2018.  Patient states that she has major anxiety and is very claustrophobic. She has tried oral anti-anxiety medication for this scan but was unable to have it done due to anxiety. She also attempted to get set up for general anesthesia, but it was too expensive so she could not pursue that route. She is requesting IV anti-anxiety medications for this scan.  Discussed case with Dr. Estanislado Pandy. Informed patient, per Dr. Estanislado Pandy, that she will be given 1 dose of Ativan IV prior to MRI. Instructed patient, per Dr. Estanislado Pandy, to take 1 dose of her presecribed Xanax PO prior to coming to hospital for MRI 04/05/2018.  Patient also asked when she will know results of MRI. Informed patient that our usual protocol inclused bringing patient's in for a consultation to discuss imaging results. Patient states that waiting for results makes her extremely anxious, and asked that we phone results to her ASAP once received. Informed patient that I will discuss this with Dr. Estanislado Pandy.  All questions answered and concerns addressed. Patient conveys understanding and agrees with plan.  Bea Graff Louk, PA-C 03/27/2018, 3:44 PM

## 2018-04-05 ENCOUNTER — Encounter (HOSPITAL_COMMUNITY): Payer: Self-pay | Admitting: Radiology

## 2018-04-05 ENCOUNTER — Ambulatory Visit (HOSPITAL_COMMUNITY)
Admission: RE | Admit: 2018-04-05 | Discharge: 2018-04-05 | Disposition: A | Discharge status: Home / Self Care | Payer: Medicare Other | Source: Ambulatory Visit | Attending: Interventional Radiology | Admitting: Interventional Radiology

## 2018-04-05 DIAGNOSIS — D1800 Hemangioma unspecified site: Secondary | ICD-10-CM | POA: Diagnosis not present

## 2018-04-05 DIAGNOSIS — I639 Cerebral infarction, unspecified: Secondary | ICD-10-CM | POA: Diagnosis not present

## 2018-04-05 DIAGNOSIS — I6782 Cerebral ischemia: Secondary | ICD-10-CM | POA: Insufficient documentation

## 2018-04-05 DIAGNOSIS — I671 Cerebral aneurysm, nonruptured: Secondary | ICD-10-CM | POA: Diagnosis not present

## 2018-04-05 MED ORDER — LORAZEPAM 2 MG/ML IJ SOLN
INTRAMUSCULAR | Status: AC
  Filled 2018-04-05: qty 1

## 2018-04-05 MED ORDER — LORAZEPAM 2 MG/ML IJ SOLN
1.0000 mg | Freq: Once | INTRAMUSCULAR | Status: AC
  Administered 2018-04-05: 1 mg via INTRAVENOUS
  Filled 2018-04-05: qty 0.5

## 2018-04-10 ENCOUNTER — Other Ambulatory Visit (HOSPITAL_COMMUNITY): Payer: Self-pay | Admitting: Interventional Radiology

## 2018-04-10 DIAGNOSIS — I729 Aneurysm of unspecified site: Secondary | ICD-10-CM

## 2018-04-12 ENCOUNTER — Other Ambulatory Visit: Payer: Self-pay | Admitting: Student

## 2018-04-12 ENCOUNTER — Telehealth: Payer: Self-pay | Admitting: Student

## 2018-04-12 NOTE — Telephone Encounter (Signed)
Received message from patient regarding MRI anxiety. Patient with history left ICA intracranial aneurysm that has been monitored by Dr. Estanislado Pandy with routine imaging scans. Patient scheduled for MRA 04/17/2018.  Called patient to inform her that, per Dr. Estanislado Pandy, she will be given 1 dose of Ativan IV prior to MRI. Instructed patient, per Dr. Estanislado Pandy, to take 1 dose of her presecribed Xanax PO prior to coming to hospital for MRI 04/17/2018.  All questions answered and concerns addressed. Patient conveys understanding and agrees with plan.  Bea Graff Louk, PA-C 04/12/2018, 1:25 PM

## 2018-04-17 ENCOUNTER — Ambulatory Visit (HOSPITAL_COMMUNITY)
Admission: RE | Admit: 2018-04-17 | Discharge: 2018-04-17 | Disposition: A | Discharge status: Home / Self Care | Payer: Medicare Other | Source: Ambulatory Visit | Attending: Interventional Radiology | Admitting: Interventional Radiology

## 2018-04-17 DIAGNOSIS — I729 Aneurysm of unspecified site: Secondary | ICD-10-CM | POA: Diagnosis not present

## 2018-04-17 DIAGNOSIS — I728 Aneurysm of other specified arteries: Secondary | ICD-10-CM | POA: Diagnosis not present

## 2018-04-18 ENCOUNTER — Telehealth (HOSPITAL_COMMUNITY): Payer: Self-pay | Admitting: Radiology

## 2018-04-18 NOTE — Telephone Encounter (Signed)
Pt called and is requesting her MRA results from Dr. Estanislado Pandy. Hand delivered these results to [facility] 04/18/18 @ 1545 JM

## 2018-04-19 ENCOUNTER — Telehealth (HOSPITAL_COMMUNITY): Payer: Self-pay | Admitting: *Deleted

## 2018-04-19 NOTE — Telephone Encounter (Signed)
Called and left message regarding results.  Per Dr. Estanislado Pandy aneurysm is stable.  He is requesting that she has F/U MRI/MRA of the brain.

## 2018-04-24 ENCOUNTER — Encounter (INDEPENDENT_AMBULATORY_CARE_PROVIDER_SITE_OTHER): Payer: Self-pay | Admitting: Orthopaedic Surgery

## 2018-04-25 ENCOUNTER — Other Ambulatory Visit (INDEPENDENT_AMBULATORY_CARE_PROVIDER_SITE_OTHER): Payer: Self-pay | Admitting: Orthopaedic Surgery

## 2018-04-25 MED ORDER — HYDROCODONE-ACETAMINOPHEN 5-325 MG PO TABS: 1.0000 | ORAL_TABLET | Freq: Four times a day (QID) | ORAL | 0 refills | Status: DC | PRN

## 2018-05-11 ENCOUNTER — Encounter (INDEPENDENT_AMBULATORY_CARE_PROVIDER_SITE_OTHER): Payer: Self-pay | Admitting: Orthopaedic Surgery

## 2018-05-11 ENCOUNTER — Ambulatory Visit (INDEPENDENT_AMBULATORY_CARE_PROVIDER_SITE_OTHER): Payer: Medicare Other

## 2018-05-11 ENCOUNTER — Ambulatory Visit (INDEPENDENT_AMBULATORY_CARE_PROVIDER_SITE_OTHER): Payer: Medicare Other | Admitting: Orthopaedic Surgery

## 2018-05-11 DIAGNOSIS — M25561 Pain in right knee: Secondary | ICD-10-CM

## 2018-05-11 DIAGNOSIS — Z96651 Presence of right artificial knee joint: Secondary | ICD-10-CM

## 2018-05-11 MED ORDER — METHYLPREDNISOLONE ACETATE 40 MG/ML IJ SUSP
40.0000 mg | INTRAMUSCULAR | Status: AC | PRN
  Administered 2018-05-11: 40 mg via INTRA_ARTICULAR

## 2018-05-11 MED ORDER — LIDOCAINE HCL 1 % IJ SOLN
3.0000 mL | INTRAMUSCULAR | Status: AC | PRN
  Administered 2018-05-11: 3 mL

## 2018-05-11 NOTE — Progress Notes (Signed)
Office Visit Note   Patient: Allison Bass           Date of Birth: 02/24/1941           MRN: [id number] Visit Date: 05/11/2018              Requested by: [id number] [place], [place] [address] [place], [place] [address] PCP: Carollee Herter, Alferd Apa, DO   Assessment & Plan: Visit Diagnoses:  1. Acute pain of right knee   2. Status post total right knee replacement     Plan: I gave her reassurance that this is not a DVT.  This gave her a lot of relief.  I did provide injection over the pes bursa area with a steroid which I think will help her quite a bit.  All questions concerns were answered and addressed.  She will continue increase her activities as she tolerates.  We will see her back in 6 months which will be a year out from surgery with an AP and lateral of the right knee.  Obviously if there is any issues before then she will let us know.  Follow-Up Instructions: Return in about 6 months (around 11/11/2018).   Orders:  Orders Placed This Encounter  Procedures  . Large Joint Inj: R knee  . XR Knee 1-2 Views Right   No orders of the defined types were placed in this encounter.     Procedures: Large Joint Inj: R knee on 05/11/2018 4:43 PM Indications: diagnostic evaluation and pain Details: 22 G 1.5 in needle, superolateral approach  Arthrogram: No  Medications: 3 mL lidocaine 1 %; 40 mg methylPREDNISolone acetate 40 MG/ML Outcome: tolerated well, no immediate complications Procedure, treatment alternatives, risks and benefits explained, specific risks discussed. Consent was given by the patient. Immediately prior to procedure a time out was called to verify the correct patient, procedure, equipment, support staff and site/side marked as required. Patient was prepped and draped in the usual sterile fashion.       Clinical Data: No additional findings.   Subjective: Chief Complaint  Patient presents with  . Right Knee - Pain  The patient is  6 to 7 months status post a right total knee arthroplasty.  She comes in today out of concern about the possibility having a blood clot.  She is been having leg pain on the right operative side and she feels like there is a mass in this area.  However she is pointing to the anterior medial area of the knee near the pes bursa area as a source of her pain.  She denies any foot swelling and denies any calf pain.  She says the knee replacement itself is doing well on her right side.  HPI  Review of Systems She currently denies any shortness of breath or fever or chills.  Objective: Vital Signs: There were no vitals taken for this visit.  Physical Exam She is alert and oriented x3 and in no acute distress Ortho Exam Examination of her right lower extremity shows that her knee moves well.  Her flexion extension are full.  Her swelling is minimal.  There is no significant effusion of her knee.  Her calf is soft.  She has significant pain over the pes bursa area and I can feel potentially a lipoma in this area. Specialty Comments:  No specialty comments available.  Imaging: Xr Knee 1-2 Views Right  Result Date: 05/11/2018 2 views of the right  knee show a total knee arthroplasty with no acute findings or complicating features.    PMFS History: Patient Active Problem List   Diagnosis Date Noted  . Status post total right knee replacement 11/11/2017  . Unilateral primary osteoarthritis, right knee 10/11/2017  . S/P right knee arthroscopy 05/05/2017  . Ingrown toenail 04/12/2017  . Arthritis of right knee 01/27/2017  . Localization-related idiopathic epilepsy and epileptic syndromes with seizures of localized onset, not intractable, without status epilepticus ([place]) 09/09/2016  . Injury of left ankle and foot 07/13/2016  . 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 Essential hypertension 05/17/2016  . Right knee pain 04/23/2016  . Ankle sprain 04/01/2016    . Low back pain 01/27/2016  . Faintness 01/27/2016  . Transient alteration of awareness 01/27/2016  . Poison ivy dermatitis 03/11/2015  . 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 Epilepsy ([place]) 02/24/2015   Past Medical History:  Diagnosis Date  . Anxiety   . Arthritis   . Chicken pox   . Depression   . Epilepsy ([place])    controlled by Dilantin- last seizure 1995  . GERD (gastroesophageal reflux disease)   . 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 HTN (hypertension)   . Migraine   . Pneumonia   . Seasonal allergies   . Seizures ([place])   . Stroke [facility])    2017    Family History  Problem Relation Age of Onset  . Alcoholism Father   . Lung cancer Father        Smoker  . Stroke Father   . High blood pressure Father   . Breast cancer Maternal Grandmother   . Heart disease Mother   . High blood pressure Mother   . Depression Mother   . Seizures Mother   . Colon cancer Neg Hx   . Esophageal cancer Neg Hx   . Stomach cancer Neg Hx   . Rectal cancer Neg Hx     Past Surgical History:  Procedure Laterality Date  . ABDOMINAL HYSTERECTOMY    . APPENDECTOMY     with hysterectomy  . CATARACT EXTRACTION Bilateral   . IR ANGIO INTRA EXTRACRAN SEL COM CAROTID INNOMINATE BILAT MOD SED  07/18/2017  . IR ANGIO VERTEBRAL SEL VERTEBRAL BILAT MOD SED  07/18/2017  . IR RADIOLOGIST EVAL & MGMT  06/29/2017  . IR RADIOLOGIST EVAL & MGMT  07/27/2017  . IR RADIOLOGIST EVAL & MGMT  09/23/2017  . KNEE ARTHROSCOPY     RIGHT  . LYMPHADENECTOMY     Benign, age 53  . RADIOLOGY WITH ANESTHESIA N/A 07/18/2017   Procedure: EMBOLIZATION;  Surgeon: Luanne Bras, MD;  Location: [facility];  Service: Radiology;  Laterality: N/A;  . RADIOLOGY WITH ANESTHESIA N/A 07/21/2017   Procedure: MRI BRAIN WITHOUT;  Surgeon: Radiologist, Medication, MD;  Location: [facility];  Service: Radiology;  Laterality: N/A;  . TONSILLECTOMY AND ADENOIDECTOMY    . TOTAL KNEE ARTHROPLASTY Right 11/11/2017   Procedure: RIGHT  TOTAL KNEE ARTHROPLASTY;  Surgeon: Mcarthur Rossetti, MD;  Location: [facility];  Service: Orthopedics;  Laterality: Right;   Social History   Occupational History  . Occupation: Retired    Comment: retired  Tobacco Use  . Smoking status: Never Smoker  . Smokeless tobacco: Never Used  Substance and Sexual Activity  . Alcohol use: Yes    Alcohol/week: 0.0 oz    Comment: Occ  . Drug use: No  . Sexual activity:  Not on file

## 2018-05-30 ENCOUNTER — Ambulatory Visit: Payer: Self-pay | Admitting: Neurology

## 2018-06-11 ENCOUNTER — Telehealth: Payer: Self-pay | Admitting: Family Medicine

## 2018-06-11 DIAGNOSIS — F419 Anxiety disorder, unspecified: Secondary | ICD-10-CM

## 2018-06-12 NOTE — Telephone Encounter (Signed)
Last alprazolam RX: 03/03/18, #90 x 2 refills Last OV: 05/24/17, past due? Next OV: none scheduled UDS: 03/08/18, low; repeat in 54yr CSC: 03/08/18 CSR: No discrepancies identified. Please advise?

## 2018-06-14 ENCOUNTER — Other Ambulatory Visit: Payer: Self-pay | Admitting: Family Medicine

## 2018-06-14 DIAGNOSIS — G40909 Epilepsy, unspecified, not intractable, without status epilepticus: Secondary | ICD-10-CM

## 2018-06-14 DIAGNOSIS — I10 Essential (primary) hypertension: Secondary | ICD-10-CM

## 2018-06-14 DIAGNOSIS — Z780 Asymptomatic menopausal state: Secondary | ICD-10-CM

## 2018-06-14 NOTE — Telephone Encounter (Signed)
Called pt left voicemail for pt to call us back to set up appt with Dr. Carollee Herter.

## 2018-06-14 NOTE — Telephone Encounter (Signed)
Please call pt to schedule OV with Dr Carollee Herter soon. It has been greater than 1 yr since pt was last seen. Thanks!

## 2018-07-11 ENCOUNTER — Other Ambulatory Visit: Payer: Self-pay | Admitting: Family Medicine

## 2018-07-11 DIAGNOSIS — F320 Major depressive disorder, single episode, mild: Secondary | ICD-10-CM

## 2018-07-11 DIAGNOSIS — F419 Anxiety disorder, unspecified: Secondary | ICD-10-CM

## 2018-07-13 DIAGNOSIS — H35371 Puckering of macula, right eye: Secondary | ICD-10-CM | POA: Diagnosis not present

## 2018-08-11 ENCOUNTER — Telehealth: Payer: Self-pay | Admitting: *Deleted

## 2018-08-11 NOTE — Telephone Encounter (Signed)
Received request for Medical Records from [place]; forwarded to Medical Records via email/scan/SLS 10/18

## 2018-08-22 ENCOUNTER — Telehealth: Payer: Self-pay | Admitting: *Deleted

## 2018-08-22 NOTE — Telephone Encounter (Signed)
Received request for Medical Records from [facility]; forwarded to Medical Records via email/scan/SLS

## 2018-08-28 ENCOUNTER — Encounter: Payer: Self-pay | Admitting: Family Medicine

## 2018-08-29 DIAGNOSIS — J019 Acute sinusitis, unspecified: Secondary | ICD-10-CM | POA: Diagnosis not present

## 2018-09-09 ENCOUNTER — Other Ambulatory Visit: Payer: Self-pay | Admitting: Family Medicine

## 2018-09-09 DIAGNOSIS — F419 Anxiety disorder, unspecified: Secondary | ICD-10-CM

## 2018-09-11 NOTE — Telephone Encounter (Signed)
Database ran and is on your desk for review.  Last filled per database: 08/13/18 Last written: 06/13/18 Last ov: 05/06/17 Next ov: none Contract: 03/09/19 UDS: 03/09/19

## 2018-09-12 ENCOUNTER — Other Ambulatory Visit: Payer: Self-pay | Admitting: Family Medicine

## 2018-09-12 DIAGNOSIS — I10 Essential (primary) hypertension: Secondary | ICD-10-CM

## 2018-09-12 DIAGNOSIS — G40909 Epilepsy, unspecified, not intractable, without status epilepticus: Secondary | ICD-10-CM

## 2018-09-12 DIAGNOSIS — Z780 Asymptomatic menopausal state: Secondary | ICD-10-CM

## 2018-09-19 DIAGNOSIS — Z23 Encounter for immunization: Secondary | ICD-10-CM | POA: Diagnosis not present

## 2018-09-19 DIAGNOSIS — I10 Essential (primary) hypertension: Secondary | ICD-10-CM | POA: Diagnosis not present

## 2018-09-19 DIAGNOSIS — E876 Hypokalemia: Secondary | ICD-10-CM | POA: Diagnosis not present

## 2018-09-19 DIAGNOSIS — Z5181 Encounter for therapeutic drug level monitoring: Secondary | ICD-10-CM | POA: Diagnosis not present

## 2018-09-19 DIAGNOSIS — Z7689 Persons encountering health services in other specified circumstances: Secondary | ICD-10-CM | POA: Diagnosis not present

## 2018-09-19 DIAGNOSIS — I63112 Cerebral infarction due to embolism of left vertebral artery: Secondary | ICD-10-CM | POA: Diagnosis not present

## 2018-09-19 DIAGNOSIS — F418 Other specified anxiety disorders: Secondary | ICD-10-CM | POA: Diagnosis not present

## 2018-09-19 DIAGNOSIS — G40009 Localization-related (focal) (partial) idiopathic epilepsy and epileptic syndromes with seizures of localized onset, not intractable, without status epilepticus: Secondary | ICD-10-CM | POA: Diagnosis not present

## 2018-09-19 DIAGNOSIS — Z79899 Other long term (current) drug therapy: Secondary | ICD-10-CM | POA: Diagnosis not present

## 2018-09-20 DIAGNOSIS — Z5181 Encounter for therapeutic drug level monitoring: Secondary | ICD-10-CM | POA: Diagnosis not present

## 2018-09-20 DIAGNOSIS — I10 Essential (primary) hypertension: Secondary | ICD-10-CM | POA: Diagnosis not present

## 2018-10-15 DIAGNOSIS — J069 Acute upper respiratory infection, unspecified: Secondary | ICD-10-CM | POA: Diagnosis not present

## 2018-11-01 ENCOUNTER — Telehealth: Payer: Self-pay | Admitting: *Deleted

## 2018-11-01 NOTE — Telephone Encounter (Signed)
Received request for Medical Records from [facility]; forwarded to Medical Records via email/scan/SLS 01/08

## 2018-11-13 ENCOUNTER — Ambulatory Visit (INDEPENDENT_AMBULATORY_CARE_PROVIDER_SITE_OTHER): Payer: Medicare Other | Admitting: Orthopaedic Surgery

## 2018-11-13 ENCOUNTER — Ambulatory Visit (INDEPENDENT_AMBULATORY_CARE_PROVIDER_SITE_OTHER): Payer: Medicare Other

## 2018-11-13 ENCOUNTER — Encounter (INDEPENDENT_AMBULATORY_CARE_PROVIDER_SITE_OTHER): Payer: Self-pay | Admitting: Orthopaedic Surgery

## 2018-11-13 DIAGNOSIS — Z96651 Presence of right artificial knee joint: Secondary | ICD-10-CM

## 2018-11-13 NOTE — Progress Notes (Signed)
The patient is 1 year status post a right total knee arthroplasty.  She says she has no issues with that at all.  On exam she has some slight swelling around the pes bursa area but this is asymptomatic.  Her incisions healed nicely.  Her right knee range of motion is entirely full and the knee feels ligamentously stable.  3 views of the right knee show a total knee arthroplasty with no complicating features or evidence of loosening.  Since she is doing so well follow-up will be as needed.  All question concerns were answered and addressed.  She understands if she has any issues with that knee at all she should not hesitate to come see [place].

## 2018-11-16 ENCOUNTER — Telehealth: Payer: Self-pay | Admitting: Physician Assistant

## 2018-11-16 ENCOUNTER — Other Ambulatory Visit (HOSPITAL_COMMUNITY): Payer: Self-pay | Admitting: Interventional Radiology

## 2018-11-16 DIAGNOSIS — I729 Aneurysm of unspecified site: Secondary | ICD-10-CM

## 2018-11-16 DIAGNOSIS — D1800 Hemangioma unspecified site: Secondary | ICD-10-CM

## 2018-11-16 MED ORDER — LORAZEPAM 2 MG PO TABS: 2.0000 mg | ORAL_TABLET | Freq: Once | ORAL | 0 refills | Status: DC | PRN

## 2018-11-16 NOTE — Telephone Encounter (Signed)
  Patient is scheduled for MRI. She is requesting anxiety medication prior to procedure.  She already takes scheduled Xanax but she would like something in addition to this.  I will send in Ativan 2 mg x 2. 1 5 hours prior to procedure, the other an hour before if needed. No refills.  Electronically sent to [facility] in [facility].  Maija Biggers S Parke Jandreau PA-C 11/16/2018 9:16 AM

## 2018-11-17 ENCOUNTER — Other Ambulatory Visit (HOSPITAL_COMMUNITY): Payer: Self-pay | Admitting: Physician Assistant

## 2018-11-17 MED ORDER — LORAZEPAM 2 MG PO TABS: 2.0000 mg | ORAL_TABLET | Freq: Once | ORAL | 0 refills | Status: AC | PRN

## 2018-11-24 DIAGNOSIS — F418 Other specified anxiety disorders: Secondary | ICD-10-CM | POA: Diagnosis not present

## 2018-11-24 DIAGNOSIS — I63112 Cerebral infarction due to embolism of left vertebral artery: Secondary | ICD-10-CM | POA: Diagnosis not present

## 2018-11-24 DIAGNOSIS — H9193 Unspecified hearing loss, bilateral: Secondary | ICD-10-CM | POA: Diagnosis not present

## 2018-11-24 DIAGNOSIS — R0981 Nasal congestion: Secondary | ICD-10-CM | POA: Diagnosis not present

## 2018-11-24 DIAGNOSIS — I10 Essential (primary) hypertension: Secondary | ICD-10-CM | POA: Diagnosis not present

## 2018-11-24 DIAGNOSIS — E876 Hypokalemia: Secondary | ICD-10-CM | POA: Diagnosis not present

## 2018-11-24 DIAGNOSIS — L821 Other seborrheic keratosis: Secondary | ICD-10-CM | POA: Diagnosis not present

## 2018-11-24 DIAGNOSIS — G40009 Localization-related (focal) (partial) idiopathic epilepsy and epileptic syndromes with seizures of localized onset, not intractable, without status epilepticus: Secondary | ICD-10-CM | POA: Diagnosis not present

## 2018-11-24 DIAGNOSIS — Z780 Asymptomatic menopausal state: Secondary | ICD-10-CM | POA: Diagnosis not present

## 2018-11-24 DIAGNOSIS — Z5181 Encounter for therapeutic drug level monitoring: Secondary | ICD-10-CM | POA: Diagnosis not present

## 2018-11-29 ENCOUNTER — Ambulatory Visit: Payer: Medicare Other | Admitting: Licensed Clinical Social Worker

## 2018-12-01 ENCOUNTER — Ambulatory Visit (HOSPITAL_COMMUNITY): Payer: Medicare Other

## 2018-12-01 ENCOUNTER — Ambulatory Visit (HOSPITAL_COMMUNITY)
Admission: RE | Admit: 2018-12-01 | Discharge: 2018-12-01 | Disposition: A | Discharge status: Home / Self Care | Payer: Medicare Other | Source: Ambulatory Visit | Attending: Interventional Radiology | Admitting: Interventional Radiology

## 2018-12-01 DIAGNOSIS — D1800 Hemangioma unspecified site: Secondary | ICD-10-CM | POA: Diagnosis not present

## 2018-12-01 DIAGNOSIS — I729 Aneurysm of unspecified site: Secondary | ICD-10-CM | POA: Diagnosis present

## 2018-12-01 DIAGNOSIS — I671 Cerebral aneurysm, nonruptured: Secondary | ICD-10-CM | POA: Insufficient documentation

## 2018-12-01 DIAGNOSIS — I739 Peripheral vascular disease, unspecified: Secondary | ICD-10-CM | POA: Diagnosis not present

## 2018-12-05 ENCOUNTER — Telehealth (HOSPITAL_COMMUNITY): Payer: Self-pay

## 2018-12-05 NOTE — Telephone Encounter (Signed)
Pt agreed to f/u in 6 months with mri/mra wo. AW  

## 2018-12-07 ENCOUNTER — Ambulatory Visit (INDEPENDENT_AMBULATORY_CARE_PROVIDER_SITE_OTHER): Payer: Medicare Other | Admitting: Licensed Clinical Social Worker

## 2018-12-07 ENCOUNTER — Ambulatory Visit: Payer: Medicare Other | Admitting: Licensed Clinical Social Worker

## 2018-12-07 DIAGNOSIS — F331 Major depressive disorder, recurrent, moderate: Secondary | ICD-10-CM | POA: Diagnosis not present

## 2018-12-11 ENCOUNTER — Other Ambulatory Visit: Payer: Self-pay | Admitting: Family Medicine

## 2018-12-11 DIAGNOSIS — Z780 Asymptomatic menopausal state: Secondary | ICD-10-CM

## 2018-12-11 DIAGNOSIS — I10 Essential (primary) hypertension: Secondary | ICD-10-CM

## 2018-12-11 DIAGNOSIS — G40909 Epilepsy, unspecified, not intractable, without status epilepticus: Secondary | ICD-10-CM

## 2018-12-22 DIAGNOSIS — H903 Sensorineural hearing loss, bilateral: Secondary | ICD-10-CM | POA: Diagnosis not present

## 2018-12-27 ENCOUNTER — Ambulatory Visit: Payer: Medicare Other | Admitting: Licensed Clinical Social Worker

## 2019-01-02 DIAGNOSIS — S2232XA Fracture of one rib, left side, initial encounter for closed fracture: Secondary | ICD-10-CM | POA: Diagnosis not present

## 2019-01-02 DIAGNOSIS — R0781 Pleurodynia: Secondary | ICD-10-CM | POA: Diagnosis not present

## 2019-01-02 DIAGNOSIS — M542 Cervicalgia: Secondary | ICD-10-CM | POA: Diagnosis not present

## 2019-01-02 DIAGNOSIS — S299XXA Unspecified injury of thorax, initial encounter: Secondary | ICD-10-CM | POA: Diagnosis not present

## 2019-01-02 DIAGNOSIS — W19XXXA Unspecified fall, initial encounter: Secondary | ICD-10-CM | POA: Diagnosis not present

## 2019-01-02 DIAGNOSIS — G44319 Acute post-traumatic headache, not intractable: Secondary | ICD-10-CM | POA: Diagnosis not present

## 2019-01-02 DIAGNOSIS — M546 Pain in thoracic spine: Secondary | ICD-10-CM | POA: Diagnosis not present

## 2019-01-03 DIAGNOSIS — S2232XA Fracture of one rib, left side, initial encounter for closed fracture: Secondary | ICD-10-CM | POA: Diagnosis not present

## 2019-01-03 DIAGNOSIS — M542 Cervicalgia: Secondary | ICD-10-CM | POA: Diagnosis not present

## 2019-01-03 DIAGNOSIS — W19XXXA Unspecified fall, initial encounter: Secondary | ICD-10-CM | POA: Diagnosis not present

## 2019-01-03 DIAGNOSIS — M546 Pain in thoracic spine: Secondary | ICD-10-CM | POA: Diagnosis not present

## 2019-01-04 DIAGNOSIS — W19XXXA Unspecified fall, initial encounter: Secondary | ICD-10-CM | POA: Diagnosis not present

## 2019-01-04 DIAGNOSIS — G44319 Acute post-traumatic headache, not intractable: Secondary | ICD-10-CM | POA: Diagnosis not present

## 2019-01-04 DIAGNOSIS — G44309 Post-traumatic headache, unspecified, not intractable: Secondary | ICD-10-CM | POA: Diagnosis not present

## 2019-01-04 DIAGNOSIS — S0990XA Unspecified injury of head, initial encounter: Secondary | ICD-10-CM | POA: Diagnosis not present

## 2019-01-09 ENCOUNTER — Ambulatory Visit: Payer: Self-pay | Admitting: Licensed Clinical Social Worker

## 2019-02-17 IMAGING — MR MR HEAD W/O CM
9 of 10 series · 37 of 48 positions shown · non-contrast
Comparison: Previous MRIs from 07/21/2017 as well as 01/29/2016.

CLINICAL DATA: Follow-up examination for cavernoma, known left
paraophthalmic ICA aneurysm.

EXAM:
MRI HEAD WITHOUT CONTRAST
TECHNIQUE: Multiplanar, multiecho pulse sequences of the brain and surrounding
structures were obtained without intravenous contrast.

[Series 4: DWI · axial · 3.0mm · 0.94mm/px · z∈[-86,+53]mm · 9 of 96 slices shown (1 of 2)]
[im 1/96]
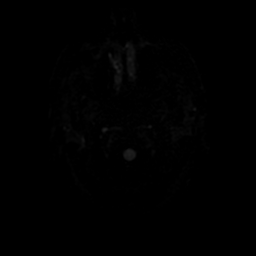
[im 12/96]
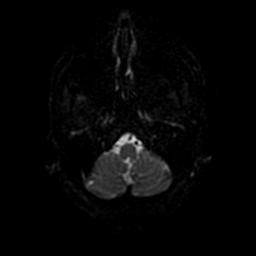
[im 24/96]
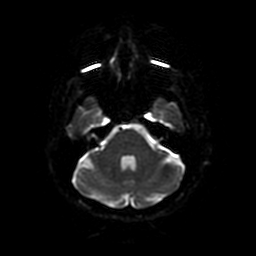
[im 36/96]
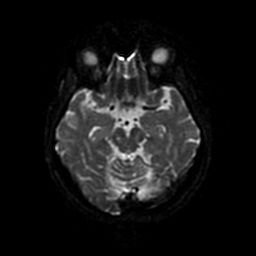
[im 48/96]
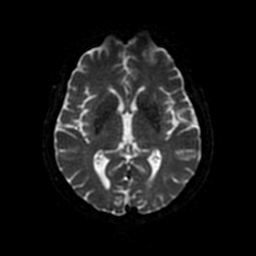
[im 60/96]
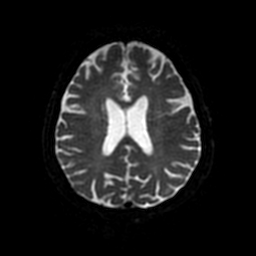
[im 72/96]
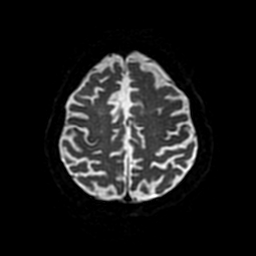
[im 84/96]
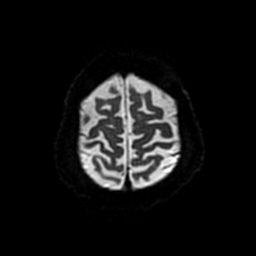
[im 96/96]
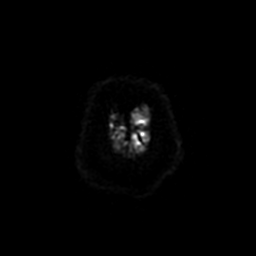

[Series 5: (person_name) · axial · 3.0mm · 0.47mm/px · z∈[-86,-24]mm · 4 of 96 slices shown]
[im 1/96]
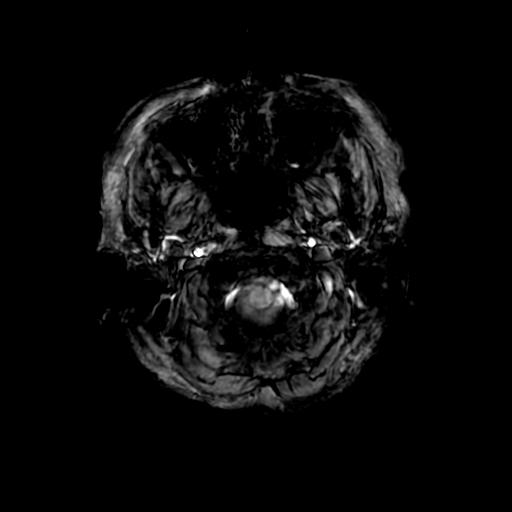
[im 11/96]
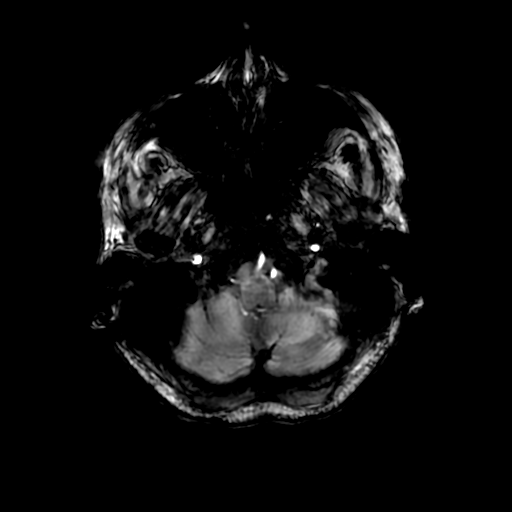
[im 32/96]
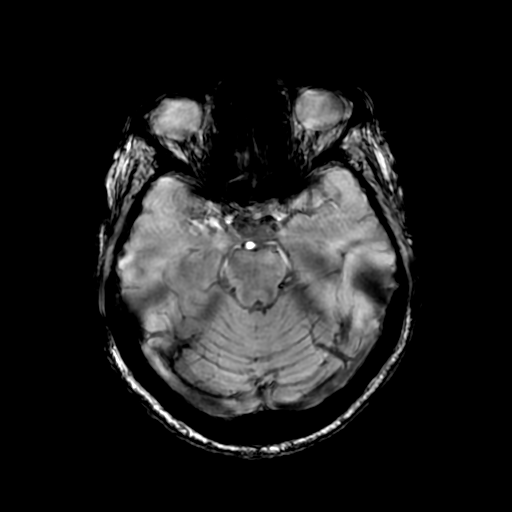
[im 43/96]
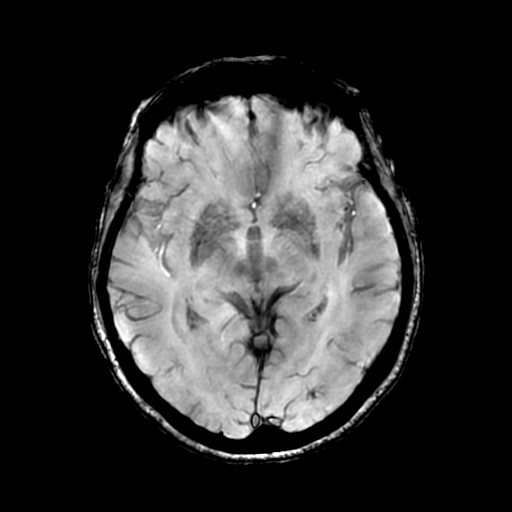

[Series 6: FLAIR · axial · 3.0mm · 0.47mm/px · z∈[-85,+51]mm · 2 of 24 slices shown (1 of 2)]
[im 1/24]
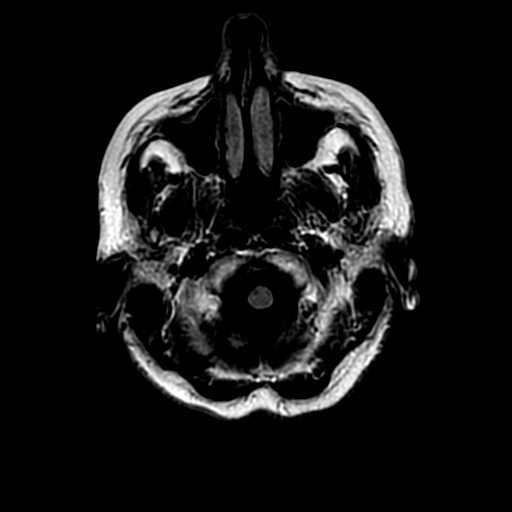
[im 24/24]
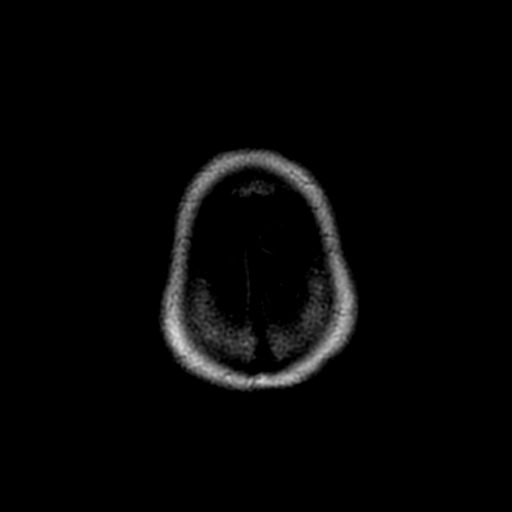

[Series 7: DWI · coronal · 4.0mm · 0.94mm/px · 7 of 66 slices shown (2 of 2)]
[im 1/66]
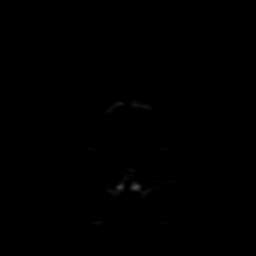
[im 11/66]
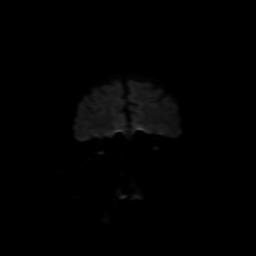
[im 22/66]
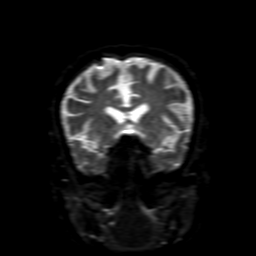
[im 33/66]
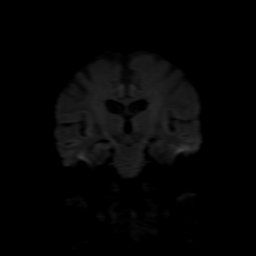
[im 44/66]
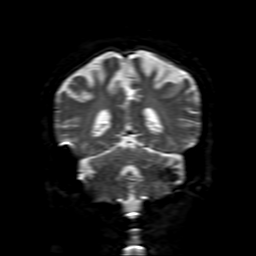
[im 55/66]
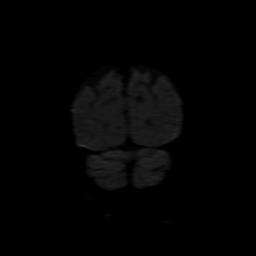
[im 66/66]
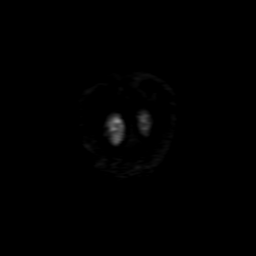

[Series 8: T2 · axial · 5.0mm · 0.47mm/px · z∈[-85,+51]mm · 2 of 24 slices shown (1 of 2)]
[im 1/24]
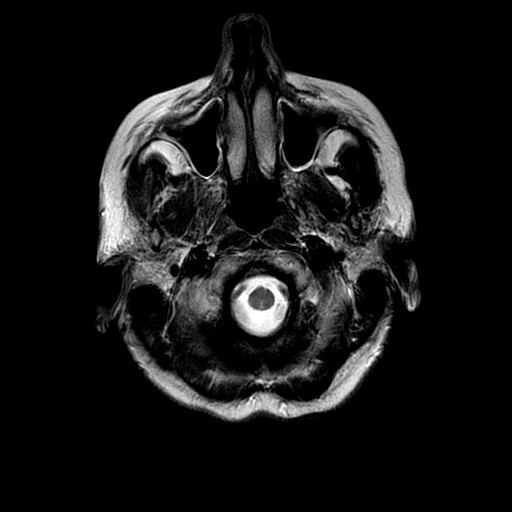
[im 24/24]
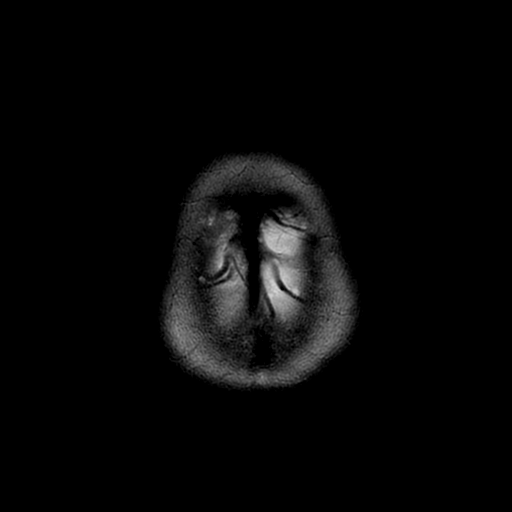

[Series 9: FLAIR · sagittal · 5.0mm · 0.47mm/px · 2 of 23 slices shown (2 of 2)]
[im 1/23]
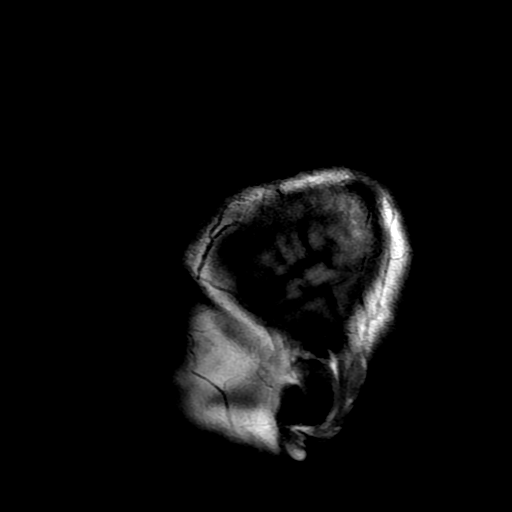
[im 23/23]
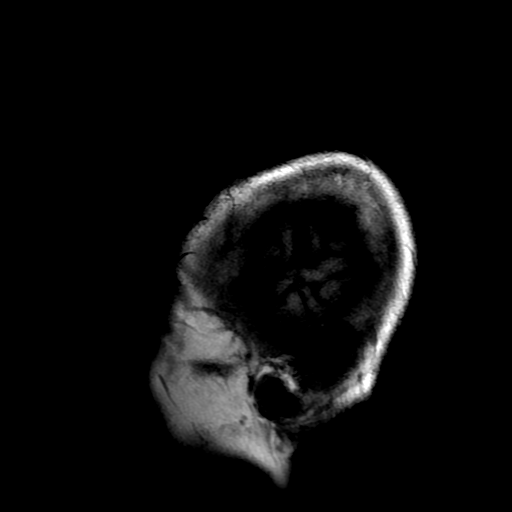

[Series 11: T2 · coronal · 5.0mm · 0.86mm/px · 3 of 28 slices shown (2 of 2)]
[im 1/28]
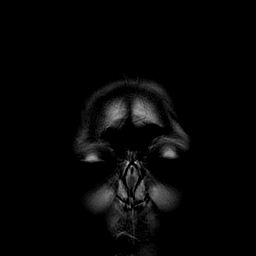
[im 14/28]
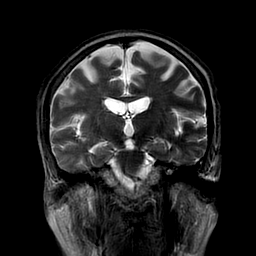
[im 28/28]
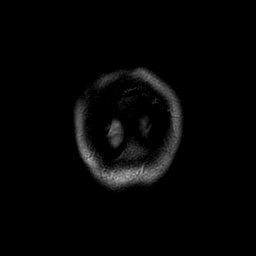

[Series 450: ADC · axial · 3.0mm · 0.94mm/px · z∈[-86,+53]mm · 5 of 46 slices shown (1 of 2)]
[im 1/46]
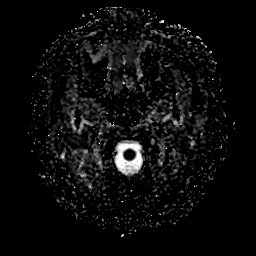
[im 12/46]
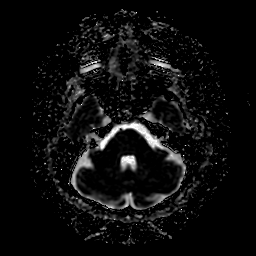
[im 23/46]
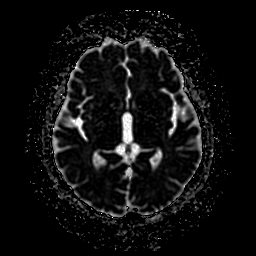
[im 34/46]
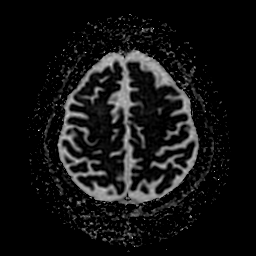
[im 46/46]
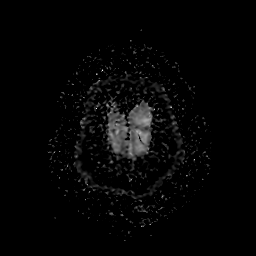

[Series 750: ADC · coronal · 4.0mm · 0.94mm/px · 3 of 33 slices shown (2 of 2)]
[im 1/33]
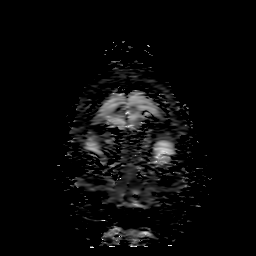
[im 17/33]
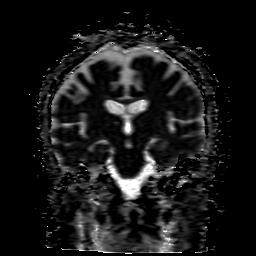
[im 33/33]
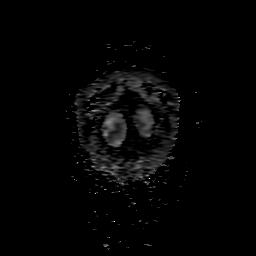

[37 of 48 positions shown; findings below may reference images not displayed]

FINDINGS: Brain: Cerebral volume is stable from previous. Patchy chronic
microvascular ischemic changes involving the periventricular and
deep white matter both cerebral hemispheres is also relatively
unchanged. Few scatter remote bilateral cerebellar infarcts noted,
stable. No abnormal foci of restricted diffusion to suggest acute or
subacute ischemia. Gray-white matter differentiation maintained. No
other evidence for chronic or interval infarction.

Susceptibility artifact in the lateral left cerebellum related to
chronic hemorrhage again seen. Overall, size is relatively unchanged
measuring approximately 15 mm 14 mm on today's study. Surrounding T2
hypointense hemosiderin ring again noted. No associated edema. No
other evidence for acute or chronic intracranial hemorrhage on SWI
sequence. Note again made of previously identified DVA in the left
occipital lobe.

No other mass lesion. No midline shift or mass effect. No
hydrocephalus. No extra-axial fluid collection. Major dural sinuses
are grossly patent.

Pituitary gland suprasellar region normal. Midline structures intact
and normal.

Vascular: Major intracranial vascular flow voids are maintained.
Known left ICA aneurysm not visible.

Skull and upper cervical spine: Craniocervical junction within
normal limits. Upper cervical spine normal. Bone marrow signal
intensity normal. No scalp soft tissue abnormality.

Sinuses/Orbits: Globes and oval soft tissues within normal limits.
Patient status post cataract extraction bilaterally. Scattered
mucosal thickening within the ethmoidal air cells, sphenoid sinuses,
and maxillary sinuses. Superimposed small fluid levels noted within
seen at sinuses. No mastoid effusion. Inner ear structures normal.

Other: None.
IMPRESSION: 1. No significant interval change in left cerebellar hemorrhage,
measuring 14 mm in diameter on today's study. Again, finding most
likely reflects a cavernoma. No associated edema.
2. Otherwise stable appearance of the brain with a few scattered
small chronic lacunar infarcts involving the bilateral cerebellar
hemispheres.
3. No other new intracranial abnormality.
4. Sphenoid sinusitis.

## 2019-03-14 ENCOUNTER — Ambulatory Visit (INDEPENDENT_AMBULATORY_CARE_PROVIDER_SITE_OTHER): Payer: Medicare Other | Admitting: Licensed Clinical Social Worker

## 2019-03-14 DIAGNOSIS — F4323 Adjustment disorder with mixed anxiety and depressed mood: Secondary | ICD-10-CM

## 2019-03-20 ENCOUNTER — Other Ambulatory Visit (HOSPITAL_COMMUNITY): Payer: Self-pay | Admitting: Interventional Radiology

## 2019-03-20 ENCOUNTER — Telehealth (HOSPITAL_COMMUNITY): Payer: Self-pay

## 2019-03-20 ENCOUNTER — Telehealth: Payer: Self-pay | Admitting: Radiology

## 2019-03-20 DIAGNOSIS — R2689 Other abnormalities of gait and mobility: Secondary | ICD-10-CM

## 2019-03-20 DIAGNOSIS — D1800 Hemangioma unspecified site: Secondary | ICD-10-CM

## 2019-03-20 DIAGNOSIS — I729 Aneurysm of unspecified site: Secondary | ICD-10-CM

## 2019-03-20 DIAGNOSIS — R42 Dizziness and giddiness: Secondary | ICD-10-CM

## 2019-03-20 NOTE — Progress Notes (Signed)
   Known L ICA aneurysm - followed buy Dr Estanislado Pandy  Calls to report dizziness and balance issues x 2-3 weeks Denies N/V Denies Headache Denies vision changes Moving all 4s Denies numbness or tingling  Has had a few falls in last few weeks Has never hurt herself  Taking ASA daily  She has spoken to Dr Delice Lesch-- has an appt tomorrow 3pm  I spoke to Dr Estanislado Pandy Last imaging  12/01/18  Need MRI Brain and MRA Brain asap per Dr Estanislado Pandy  Pt is aware Scheduler will call pt with time and date asap  Please keep appt with Dr Delice Lesch tomorrow She has good understanding of plan

## 2019-03-20 NOTE — Telephone Encounter (Signed)
Pt called with new sx of dizziness and balance issues. She goes to see Dr. Delice Lesch tomorrow but she would like for the pt to reach out and let Dr. Estanislado Pandy know what is going on. I have messaged our PA and will give the pt a call back. She agreed. AW

## 2019-03-21 ENCOUNTER — Ambulatory Visit (INDEPENDENT_AMBULATORY_CARE_PROVIDER_SITE_OTHER): Payer: Medicare Other | Admitting: Licensed Clinical Social Worker

## 2019-03-21 DIAGNOSIS — F4323 Adjustment disorder with mixed anxiety and depressed mood: Secondary | ICD-10-CM

## 2019-03-22 ENCOUNTER — Other Ambulatory Visit (HOSPITAL_COMMUNITY): Payer: Self-pay | Admitting: Interventional Radiology

## 2019-03-22 ENCOUNTER — Telehealth (HOSPITAL_COMMUNITY): Payer: Self-pay

## 2019-03-22 DIAGNOSIS — R42 Dizziness and giddiness: Secondary | ICD-10-CM

## 2019-03-22 DIAGNOSIS — R2689 Other abnormalities of gait and mobility: Secondary | ICD-10-CM

## 2019-03-22 DIAGNOSIS — D1800 Hemangioma unspecified site: Secondary | ICD-10-CM

## 2019-03-22 NOTE — Telephone Encounter (Signed)
Returned pt's call regarding mri scheduling. She wanted scan done at [facility] because she does not feel comfortable coming into the hospital. I have gotten auth for [facility] and placed the order. The pt will give them a call to schedule. AW

## 2019-03-23 ENCOUNTER — Encounter: Payer: Self-pay | Admitting: Neurology

## 2019-03-23 ENCOUNTER — Other Ambulatory Visit: Payer: Medicare Other

## 2019-03-23 ENCOUNTER — Other Ambulatory Visit: Payer: Self-pay

## 2019-03-23 ENCOUNTER — Ambulatory Visit (INDEPENDENT_AMBULATORY_CARE_PROVIDER_SITE_OTHER): Payer: Medicare Other | Admitting: Neurology

## 2019-03-23 VITALS — BP 167/80 | HR 92 | Temp 98.2°F | Ht 65.5 in | Wt 160.8 lb

## 2019-03-23 DIAGNOSIS — G40009 Localization-related (focal) (partial) idiopathic epilepsy and epileptic syndromes with seizures of localized onset, not intractable, without status epilepticus: Secondary | ICD-10-CM

## 2019-03-23 DIAGNOSIS — I63112 Cerebral infarction due to embolism of left vertebral artery: Secondary | ICD-10-CM | POA: Diagnosis not present

## 2019-03-23 DIAGNOSIS — R2689 Other abnormalities of gait and mobility: Secondary | ICD-10-CM

## 2019-03-23 DIAGNOSIS — I671 Cerebral aneurysm, nonruptured: Secondary | ICD-10-CM

## 2019-03-23 DIAGNOSIS — G629 Polyneuropathy, unspecified: Secondary | ICD-10-CM

## 2019-03-23 DIAGNOSIS — G250 Essential tremor: Secondary | ICD-10-CM | POA: Diagnosis not present

## 2019-03-23 DIAGNOSIS — I63113 Cerebral infarction due to embolism of bilateral vertebral arteries: Secondary | ICD-10-CM | POA: Diagnosis not present

## 2019-03-23 NOTE — Patient Instructions (Signed)
1. Bloodwork for TSH, B12, B1, ESR, CRP, SPEP, IFE, folate, ANA  2. Refer to [facility] for Balance therapy and Vestibular therapy  3. Proceed with MRI/MRA as scheduled  4. Discuss Propranolol with Dr. Marin Comment  5. Follow-up in 4-5 months, call for any changes

## 2019-03-23 NOTE — Progress Notes (Signed)
NEUROLOGY FOLLOW UP OFFICE NOTE  Allison Bass [id number]  DOB: [birth year]  HISTORY OF PRESENT ILLNESS: I had the pleasure of seeing Allison Bass in follow-up in the neurology clinic on 03/23/2019. The patient was last seen almost 2 years ago when she presented with 2 transient episodes of memory difficulties and a fainting spell in March 2017. 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 Stroke workup showed unremarkable carotid doppler, echo normal with EF 65-70%. 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 Estanislado Pandy has been monitoring. 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 She presents today due to new symptoms that have been concerning her. She has been feeling off balance and has been falling more. She would stand up and feel a sensation of movement, going down steps makes her feel off balance. If she was not holding on to things, she feels like she would go down. She turned and fell on a dog 3 weeks ago. She denies any dizziness when supine. She has numbness and tingling in her feet. She has chronic back pain, no neck pain. These symptoms have made her anxiety much worse. She has also noticed worsening tremor, worse in the past year. She has been using a spoon so food would not fall down. Her mother and son have tremors. She denies any headaches, diplopia, dysarthria/dysphagia, bowel/bladder dysfunction. She has been told she has hearing loss, no tinnitus. No seizures since 1995 on Dilantin 100mg  TID.  History on Initial Assessment  01/26/2016: This is a pleasant 77 yo RH woman with a history of hypertension, depression, anxiety, and seizures since her late 77s. The first seizure occurred in the early 1990s, she recalls feeling funny, "almost like I was going under surgery," then waking up in the hospital. Her husband had witnessed a generalized convulsion. She was started on Dilantin. She had been seizure-free for a while and a trial of Dilantin taper was done perhaps in 1995, during which she had a couple of breakthrough seizures within a week of stopping medication. She denies any further seizures since restarting the Dilantin in 1995. She denies any further episodes of the funny feeling, no  olfactory/gustatory hallucinations, deja vu,focal numbness/tingling/weakness, myoclonic jerks.  Her mother had seizures. Otherwise she had a normal birth and early development.  There is no history of febrile convulsions, CNS infections such as meningitis/encephalitis, significant traumatic brain injury, neurosurgical procedures, or family history of seizures.  She had been doing well until 01/12/16 she woke up and knew there was something she was supposed to do but could not recall it. She called her friend she needed to pick up food for Me[employer] on Whe[employer], and after the reminder, she did not have any other issues that day. The next morning she got up and went to pick up food, then she could not think of where her next destination was. She had to call someone to remind her where to go, then she was fine. There is note that the day prior to the first episode she was upset because her cat ran away. She took her normal TID Xanax  and then took 2 more prior to bed to help with sleep.  On 01/18/16, she was at church then had an intense feeling with nausea. The music was coming off the page, she got extremely hot and could hear things from a distance. She started to sit down and recalls her friend saying she could not lift her. She apparently fainted and woke up  spread out on several chairs. She did not have the same "funny feeling" she had in the 1990s. No convulsive activity reported. She denied any sleep deprivation or alcohol the night prior.   Laboratory Data: Lab Results  Component Value Date   HGBA1C 5.6 03/05/2016   Lab Results  Component Value Date   CHOL 216 (H) 03/05/2016   HDL 85.40 03/05/2016   LDLCALC 112 (H) 03/05/2016   TRIG 93.0 03/05/2016   CHOLHDL 3 03/05/2016     PAST MEDICAL HISTORY: Past Medical History:  Diagnosis Date  . Anxiety   . Arthritis   . Chicken pox   . Depression   . 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 GERD (gastroesophageal reflux disease)   . 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 HTN (hypertension)   . Migraine   . Pneumonia   . Seasonal allergies   . Seizures ([place])   . Stroke [facility])    2017    MEDICATIONS: Current Outpatient Medications on File Prior to Visit  Medication Sig Dispense Refill  . acetaminophen (TYLENOL) 500 MG tablet Take 1,000 mg by mouth every 4 (four) hours as needed.     . ALPRAZolam (XANAX) 0.5 MG tablet TAKE ONE TABLET BY MOUTH THREE TIMES A DAY 90 tablet 2  . atorvastatin (LIPITOR) 40 MG tablet TAKE ONE TABLET BY MOUTH ONE TIME DAILY 90 tablet 0  . desvenlafaxine (PRISTIQ) 100 MG 24 hr tablet TAKE ONE TABLET BY MOUTH ONE TIME DAILY (Patient taking differently: 150 mg. ) 90 tablet 3  . DILANTIN 100 MG ER capsule TAKE THREE CAPSULES BY MOUTH ONE TIME DAILY 270 capsule 0  . estrogens, conjugated, (PREMARIN) 1.25 MG tablet Take 1 tablet (1.25 mg total) by mouth daily. 90 tablet 0  . hydrochlorothiazide (HYDRODIURIL) 50 MG tablet TAKE ONE TABLET BY MOUTH ONE TIME DAILY 90 tablet 0  . lisinopril (PRINIVIL,ZESTRIL) 10 MG tablet Take 1 tablet (10 mg total) by mouth daily. 90 tablet 0  . MELATONIN PO Take 1 capsule by mouth at bedtime as needed (for sleep).    . potassium chloride (K-DUR) 10 MEQ tablet TAKE TWO TABLETS BY MOUTH EVERY MORNING AND  TAKE TWO TABLETS BY MOUTH EVERY EVENING 360 tablet 0  . NONFORMULARY OR COMPOUNDED ITEM [facility]:  Onychomycosis nail lacquer - Fluconazole 2%, Terbinafine 1%, DMSO appt to affected area daily. (Patient not taking: Reported on 03/23/2019) 120 each 2   No current facility-administered medications on file prior to visit.     ALLERGIES: Allergies  Allergen Reactions  . Codeine Nausea And Vomiting, Rash and Other (See Comments)    Makes the patient walk into walls Makes the patient walk into walls   . Erythromycin Other (See Comments)    UNSPECIFIED REACTION   . Garlic     FAMILY HISTORY: Family History  Problem Relation Age of Onset  . Alcoholism Father   . Lung cancer Father        Smoker  . Stroke Father   . High blood pressure Father   .  Breast cancer Maternal Grandmother   . Heart disease Mother   . High blood pressure Mother   . Depression Mother   . Seizures Mother   . Colon cancer Neg Hx   . Esophageal cancer Neg Hx   . Stomach cancer Neg Hx   . Rectal cancer Neg Hx     SOCIAL HISTORY: Social History   Socioeconomic History  . Marital status: Widowed    Spouse name: Not on file  . Number of children: 3  . Years of education: Not on file  . Highest education level: Not on file  Occupational History  . Occupation: Retired    Comment: retired  Scientific laboratory technician  . Financial resource strain: Not on file  . Food insecurity:    Worry: Not on file    Inability: Not on file  . Transportation needs:    Medical: Not on file    Non-medical: Not on file  Tobacco Use  . Smoking status: Never Smoker  . Smokeless tobacco: Never Used  Substance and Sexual Activity  . Alcohol use: Yes    Alcohol/week: 0.0 standard drinks    Comment: Occ  . Drug use: No  . Sexual activity: Not on file  Lifestyle  . Physical activity:    Days per week: Not on file    Minutes per session: Not on file  . Stress: Not on file  Relationships  . Social connections:    Talks on  phone: Not on file    Gets together: Not on file    Attends religious service: Not on file    Active member of club or organization: Not on file    Attends meetings of clubs or organizations: Not on file    Relationship status: Not on file  . Intimate partner violence:    Fear of current or ex partner: Not on file    Emotionally abused: Not on file    Physically abused: Not on file    Forced sexual activity: Not on file  Other Topics Concern  . Not on file  Social History Narrative  . Not on file    REVIEW OF SYSTEMS: Constitutional: No fevers, chills, or sweats, no generalized fatigue, change in appetite Eyes: No visual changes, double vision, eye pain Ear, nose and throat: No hearing loss, ear pain, nasal congestion, sore throat Cardiovascular: No chest pain, palpitations Respiratory:  No shortness of breath at rest or with exertion, wheezes GastrointestinaI: No nausea, vomiting, diarrhea, abdominal pain, fecal incontinence Genitourinary:  No dysuria, urinary retention or frequency Musculoskeletal:  No neck pain, +back pain Integumentary: No rash, pruritus, skin lesions Neurological: as above Psychiatric: No depression, insomnia, anxiety Endocrine: No palpitations, fatigue, diaphoresis, mood swings, change in appetite, change in weight, increased thirst Hematologic/Lymphatic:  No anemia, purpura, petechiae. Allergic/Immunologic: no itchy/runny eyes, nasal congestion, recent allergic reactions, rashes  PHYSICAL EXAM: Vitals:   03/23/19 1516  BP: (!) 167/80  Pulse: 92  Temp: 98.2 F (36.8 C)  SpO2: 97%   General: No acute distress Head:  Normocephalic/atraumatic Neck: supple, no paraspinal tenderness, full range of motion Heart:  Regular rate and rhythm Lungs:  Clear to auscultation bilaterally Back: No paraspinal tenderness Skin/Extremities: No rash, no edema, right knee in gauze wrap Neurological Exam: alert and oriented to person, place, and time. No aphasia or  dysarthria. Fund of knowledge is appropriate.  Recent and remote memory are intact.  Attention and concentration are normal.    Able to name objects and repeat  phrases. Cranial nerves: Pupils equal, round, reactive to light. Extraocular movements intact with no nystagmus. Visual fields full. Facial sensation intact. No facial asymmetry. Tongue, uvula, palate midline.  Motor: Bulk and tone normal, muscle strength 5/5 throughout with no pronator drift.  Sensation to light touch, pin, cold, vibration sense intact.  No extinction to double simultaneous stimulation.  Deep tendon reflexes +1 throughout, toes downgoing.  Finger to nose testing intact.  Gait narrow-based and steady, able to tandem walk adequately. +Romberg test. No resting tremor. Mild bilateral postural and endpoint tremor.  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 She has had breakthrough convulsions with attempts at Dilantin taper in the past. Last GTC was in 1995. She initially presented in 2017 for 2 episodes of memory loss and an episode of loss of consciousness last 01/18/16. 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 She has been having more gait imbalance and falls recently, there are no cerebellar signs on exam however with lesion in cerebellum, agree with repeat imaging scheduled at the beginning of June. 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 We agreed to hold off on EMG for now. We also discussed tremor consistent with essential tremor, she will discuss switching one of her  BP medications to Propranolol, this may help with anxiety as well. Follow-up in 4-5 months, she knows to call for any changes.   Thank you for allowing me to participate in her care.  Please do not hesitate to call for any questions or concerns.  The duration of this appointment visit was 30 minutes of face-to-face time with the patient.  Greater than 50% of this time was spent in counseling, explanation of diagnosis, planning of further management, and coordination of care.   Ellouise Newer, M.D.   CC: Dr. Marin Comment

## 2019-03-27 ENCOUNTER — Telehealth: Payer: Self-pay | Admitting: Neurology

## 2019-03-27 LAB — UIFE/LIGHT CHAINS/TP QN, 24-HR UR
Free Kappa Lt Chains,Ur: 60.59 mg/L (ref 0.63–113.79)
Free Lambda Lt Chains,Ur: 4.25 mg/L (ref 0.47–11.77)
Kappa/Lambda Ratio,U: 14.26 (ref 1.03–31.76)
Protein, Ur: 16.7 mg/dL

## 2019-03-27 LAB — SPECIMEN STATUS REPORT

## 2019-03-27 NOTE — Telephone Encounter (Signed)
I sent her a message on MyChart that they are normal. If she wants a copy of results, can mail to her, but let her know that there are 3 more pending tests, we can mail results to her when they are all back. Thanks

## 2019-03-27 NOTE — Telephone Encounter (Signed)
Called Allison Bass once other labs are back I will mail them all to her she is unsure still about using mychart.

## 2019-03-27 NOTE — Telephone Encounter (Signed)
Patient called regarding her My Chart account and not being able to access it. She would like you to call or e mail her the results. Thanks

## 2019-03-27 NOTE — Telephone Encounter (Signed)
Pt asking about her labs

## 2019-03-29 ENCOUNTER — Encounter: Payer: Self-pay | Admitting: Neurology

## 2019-04-01 LAB — TSH: TSH: 1.85 mIU/L (ref 0.40–4.50)

## 2019-04-01 LAB — C-REACTIVE PROTEIN: CRP: 3.7 mg/L (ref <8.0)

## 2019-04-01 LAB — PROTEIN ELECTROPHORESIS, SERUM
Albumin ELP: 3.7 g/dL — ABNORMAL LOW (ref 3.8–4.8)
Alpha 1: 0.4 g/dL — ABNORMAL HIGH (ref 0.2–0.3)
Alpha 2: 1 g/dL — ABNORMAL HIGH (ref 0.5–0.9)
Beta 2: 0.5 g/dL (ref 0.2–0.5)
Beta Globulin: 0.6 g/dL (ref 0.4–0.6)
Gamma Globulin: 1.4 g/dL (ref 0.8–1.7)
Total Protein: 7.6 g/dL (ref 6.1–8.1)

## 2019-04-01 LAB — ANA: Anti Nuclear Antibody (ANA): NEGATIVE

## 2019-04-01 LAB — SEDIMENTATION RATE: Sed Rate: 2 mm/h (ref 0–30)

## 2019-04-01 LAB — VITAMIN B1: Vitamin B1 (Thiamine): <6 nmol/L — ABNORMAL LOW (ref 8–30)

## 2019-04-01 LAB — VITAMIN B12: Vitamin B-12: 463 pg/mL (ref 200–1100)

## 2019-04-01 LAB — FOLATE: Folate: 5.6 ng/mL

## 2019-04-02 ENCOUNTER — Telehealth: Payer: Self-pay | Admitting: Neurology

## 2019-04-02 NOTE — Telephone Encounter (Signed)
Noted, thanks!

## 2019-04-02 NOTE — Telephone Encounter (Signed)
[facility] called to let you know that patient is scheduled for the 15th at [place]. She wanted to wait until after her MRI to schedule that's why it wasn't earlier. Just FYI. Thanks!

## 2019-04-03 ENCOUNTER — Telehealth: Payer: Self-pay | Admitting: Neurology

## 2019-04-03 ENCOUNTER — Telehealth: Payer: Self-pay

## 2019-04-03 NOTE — Telephone Encounter (Signed)
Pt called and given her Lab results, pt stated she would start taken vitamin B1 and a copy of her labs have been mailed to her

## 2019-04-03 NOTE — Telephone Encounter (Signed)
-----   Message from Cameron Sprang, MD sent at 04/03/2019  9:22 AM EDT ----- Pls let her know the rest of the bloodwork came back, her vitamin B1 level is very low, this is usually seen because of diet low in B1 and alcohol intake. If she is drinking alcohol, would stop. She needs to start taking a daily B1 100mg  supplement, can get that over the counter. She will want a copy of her labs mailed to her. Thanks!

## 2019-04-03 NOTE — Telephone Encounter (Signed)
Pt called stating that she had a missed call from the office, she also mentioned that she was expecting some results. Could you pls call her again.

## 2019-04-06 ENCOUNTER — Other Ambulatory Visit: Payer: Self-pay

## 2019-04-06 ENCOUNTER — Ambulatory Visit (HOSPITAL_COMMUNITY)
Admission: RE | Admit: 2019-04-06 | Discharge: 2019-04-06 | Disposition: A | Discharge status: Home / Self Care | Payer: Medicare Other | Source: Ambulatory Visit | Attending: Interventional Radiology | Admitting: Interventional Radiology

## 2019-04-06 ENCOUNTER — Encounter (HOSPITAL_COMMUNITY): Payer: Self-pay

## 2019-04-06 ENCOUNTER — Ambulatory Visit (HOSPITAL_COMMUNITY): Admission: RE | Admit: 2019-04-06 | Payer: Medicare Other | Source: Ambulatory Visit

## 2019-04-06 DIAGNOSIS — R42 Dizziness and giddiness: Secondary | ICD-10-CM | POA: Insufficient documentation

## 2019-04-06 DIAGNOSIS — R2689 Other abnormalities of gait and mobility: Secondary | ICD-10-CM | POA: Insufficient documentation

## 2019-04-06 DIAGNOSIS — D1800 Hemangioma unspecified site: Secondary | ICD-10-CM | POA: Diagnosis not present

## 2019-04-06 DIAGNOSIS — I671 Cerebral aneurysm, nonruptured: Secondary | ICD-10-CM | POA: Insufficient documentation

## 2019-04-09 ENCOUNTER — Telehealth (HOSPITAL_COMMUNITY): Payer: Self-pay

## 2019-04-09 NOTE — Telephone Encounter (Signed)
Called pt regarding recent mri, no answer, left vm. AW 

## 2019-04-10 ENCOUNTER — Ambulatory Visit (INDEPENDENT_AMBULATORY_CARE_PROVIDER_SITE_OTHER): Payer: Medicare Other | Admitting: Licensed Clinical Social Worker

## 2019-04-10 DIAGNOSIS — F4321 Adjustment disorder with depressed mood: Secondary | ICD-10-CM

## 2019-04-11 ENCOUNTER — Telehealth (HOSPITAL_COMMUNITY): Payer: Self-pay

## 2019-04-11 NOTE — Telephone Encounter (Signed)
Pt agreed to f/u in 6 months with mri/mra. AW  

## 2019-04-12 ENCOUNTER — Other Ambulatory Visit: Payer: Medicare Other

## 2019-04-14 IMAGING — DX DG KNEE 1-2V PORT*R*
1 series · 1 of 1 positions shown · non-contrast
Comparison: Radiographs March 26, 2016.

CLINICAL DATA: Status post right total knee replacement.

EXAM:
PORTABLE RIGHT KNEE - 1-2 VIEW

[knee ap]
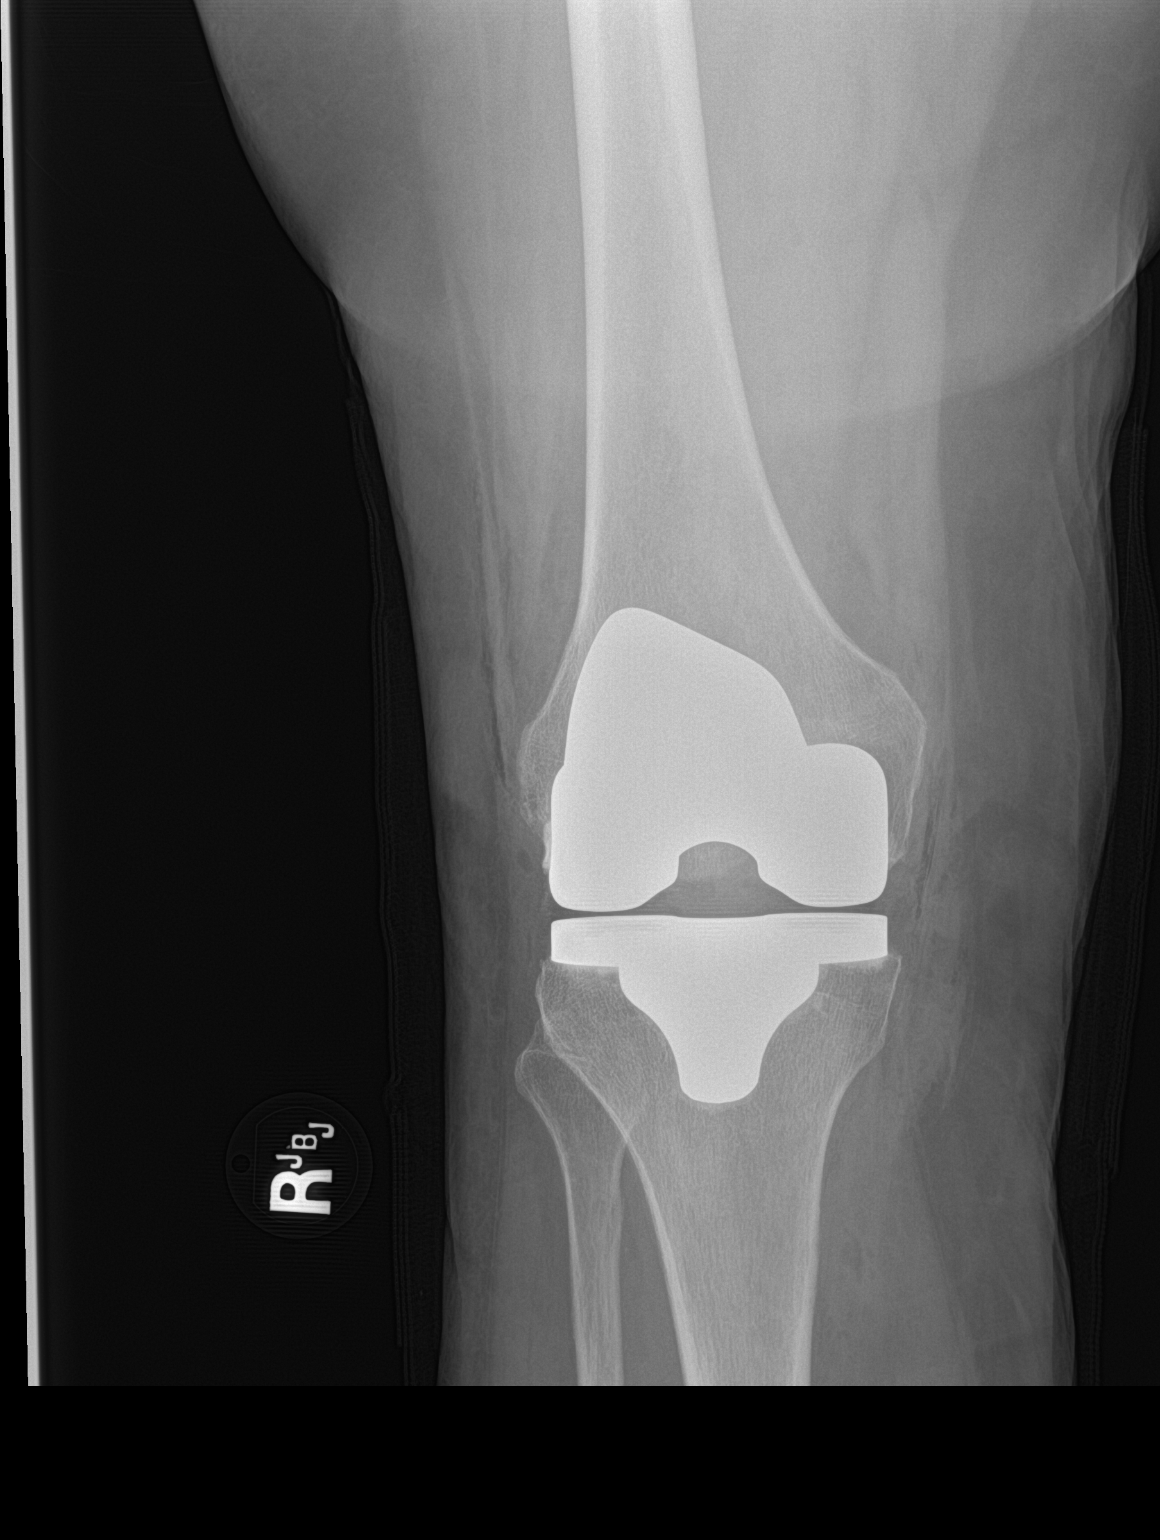

[1 of 1 positions shown; findings below may reference images not displayed]

FINDINGS: The femoral and tibial components appear to be well situated. No
fracture or dislocation is noted. Postoperative changes are noted in
the soft tissues anteriorly.
IMPRESSION: Status post right total knee arthroplasty.

## 2019-04-16 DIAGNOSIS — R42 Dizziness and giddiness: Secondary | ICD-10-CM | POA: Diagnosis not present

## 2019-04-16 DIAGNOSIS — Z9181 History of falling: Secondary | ICD-10-CM | POA: Diagnosis not present

## 2019-04-16 DIAGNOSIS — M542 Cervicalgia: Secondary | ICD-10-CM | POA: Diagnosis not present

## 2019-04-16 DIAGNOSIS — R2681 Unsteadiness on feet: Secondary | ICD-10-CM | POA: Diagnosis not present

## 2019-04-16 DIAGNOSIS — R262 Difficulty in walking, not elsewhere classified: Secondary | ICD-10-CM | POA: Diagnosis not present

## 2019-04-16 DIAGNOSIS — I10 Essential (primary) hypertension: Secondary | ICD-10-CM | POA: Diagnosis not present

## 2019-04-16 DIAGNOSIS — R202 Paresthesia of skin: Secondary | ICD-10-CM | POA: Diagnosis not present

## 2019-04-16 DIAGNOSIS — R296 Repeated falls: Secondary | ICD-10-CM | POA: Diagnosis not present

## 2019-04-19 DIAGNOSIS — R202 Paresthesia of skin: Secondary | ICD-10-CM | POA: Diagnosis not present

## 2019-04-19 DIAGNOSIS — Z9181 History of falling: Secondary | ICD-10-CM | POA: Diagnosis not present

## 2019-04-19 DIAGNOSIS — R2681 Unsteadiness on feet: Secondary | ICD-10-CM | POA: Diagnosis not present

## 2019-04-19 DIAGNOSIS — R42 Dizziness and giddiness: Secondary | ICD-10-CM | POA: Diagnosis not present

## 2019-04-19 DIAGNOSIS — R262 Difficulty in walking, not elsewhere classified: Secondary | ICD-10-CM | POA: Diagnosis not present

## 2019-04-19 DIAGNOSIS — M542 Cervicalgia: Secondary | ICD-10-CM | POA: Diagnosis not present

## 2019-04-19 DIAGNOSIS — R296 Repeated falls: Secondary | ICD-10-CM | POA: Diagnosis not present

## 2019-04-19 DIAGNOSIS — I10 Essential (primary) hypertension: Secondary | ICD-10-CM | POA: Diagnosis not present

## 2019-04-20 DIAGNOSIS — R262 Difficulty in walking, not elsewhere classified: Secondary | ICD-10-CM | POA: Diagnosis not present

## 2019-04-20 DIAGNOSIS — M542 Cervicalgia: Secondary | ICD-10-CM | POA: Diagnosis not present

## 2019-04-20 DIAGNOSIS — R42 Dizziness and giddiness: Secondary | ICD-10-CM | POA: Diagnosis not present

## 2019-04-20 DIAGNOSIS — R296 Repeated falls: Secondary | ICD-10-CM | POA: Diagnosis not present

## 2019-04-20 DIAGNOSIS — I10 Essential (primary) hypertension: Secondary | ICD-10-CM | POA: Diagnosis not present

## 2019-04-20 DIAGNOSIS — R202 Paresthesia of skin: Secondary | ICD-10-CM | POA: Diagnosis not present

## 2019-04-20 DIAGNOSIS — Z9181 History of falling: Secondary | ICD-10-CM | POA: Diagnosis not present

## 2019-04-20 DIAGNOSIS — R2681 Unsteadiness on feet: Secondary | ICD-10-CM | POA: Diagnosis not present

## 2019-04-23 DIAGNOSIS — M542 Cervicalgia: Secondary | ICD-10-CM | POA: Diagnosis not present

## 2019-04-23 DIAGNOSIS — R202 Paresthesia of skin: Secondary | ICD-10-CM | POA: Diagnosis not present

## 2019-04-23 DIAGNOSIS — R42 Dizziness and giddiness: Secondary | ICD-10-CM | POA: Diagnosis not present

## 2019-04-23 DIAGNOSIS — R2681 Unsteadiness on feet: Secondary | ICD-10-CM | POA: Diagnosis not present

## 2019-04-23 DIAGNOSIS — R262 Difficulty in walking, not elsewhere classified: Secondary | ICD-10-CM | POA: Diagnosis not present

## 2019-04-23 DIAGNOSIS — I10 Essential (primary) hypertension: Secondary | ICD-10-CM | POA: Diagnosis not present

## 2019-04-23 DIAGNOSIS — R296 Repeated falls: Secondary | ICD-10-CM | POA: Diagnosis not present

## 2019-04-23 DIAGNOSIS — Z9181 History of falling: Secondary | ICD-10-CM | POA: Diagnosis not present

## 2019-04-25 DIAGNOSIS — I10 Essential (primary) hypertension: Secondary | ICD-10-CM | POA: Diagnosis not present

## 2019-04-25 DIAGNOSIS — R42 Dizziness and giddiness: Secondary | ICD-10-CM | POA: Diagnosis not present

## 2019-04-25 DIAGNOSIS — R202 Paresthesia of skin: Secondary | ICD-10-CM | POA: Diagnosis not present

## 2019-04-25 DIAGNOSIS — Z9181 History of falling: Secondary | ICD-10-CM | POA: Diagnosis not present

## 2019-04-25 DIAGNOSIS — M542 Cervicalgia: Secondary | ICD-10-CM | POA: Diagnosis not present

## 2019-04-25 DIAGNOSIS — R262 Difficulty in walking, not elsewhere classified: Secondary | ICD-10-CM | POA: Diagnosis not present

## 2019-04-25 DIAGNOSIS — R296 Repeated falls: Secondary | ICD-10-CM | POA: Diagnosis not present

## 2019-04-25 DIAGNOSIS — R2681 Unsteadiness on feet: Secondary | ICD-10-CM | POA: Diagnosis not present

## 2019-04-26 ENCOUNTER — Ambulatory Visit (INDEPENDENT_AMBULATORY_CARE_PROVIDER_SITE_OTHER): Payer: Medicare Other | Admitting: Licensed Clinical Social Worker

## 2019-04-26 DIAGNOSIS — F3341 Major depressive disorder, recurrent, in partial remission: Secondary | ICD-10-CM

## 2019-04-30 DIAGNOSIS — I10 Essential (primary) hypertension: Secondary | ICD-10-CM | POA: Diagnosis not present

## 2019-04-30 DIAGNOSIS — R202 Paresthesia of skin: Secondary | ICD-10-CM | POA: Diagnosis not present

## 2019-04-30 DIAGNOSIS — M542 Cervicalgia: Secondary | ICD-10-CM | POA: Diagnosis not present

## 2019-04-30 DIAGNOSIS — R42 Dizziness and giddiness: Secondary | ICD-10-CM | POA: Diagnosis not present

## 2019-04-30 DIAGNOSIS — R2681 Unsteadiness on feet: Secondary | ICD-10-CM | POA: Diagnosis not present

## 2019-04-30 DIAGNOSIS — R262 Difficulty in walking, not elsewhere classified: Secondary | ICD-10-CM | POA: Diagnosis not present

## 2019-04-30 DIAGNOSIS — R296 Repeated falls: Secondary | ICD-10-CM | POA: Diagnosis not present

## 2019-04-30 DIAGNOSIS — Z9181 History of falling: Secondary | ICD-10-CM | POA: Diagnosis not present

## 2019-05-02 DIAGNOSIS — Z9181 History of falling: Secondary | ICD-10-CM | POA: Diagnosis not present

## 2019-05-02 DIAGNOSIS — R296 Repeated falls: Secondary | ICD-10-CM | POA: Diagnosis not present

## 2019-05-02 DIAGNOSIS — R262 Difficulty in walking, not elsewhere classified: Secondary | ICD-10-CM | POA: Diagnosis not present

## 2019-05-02 DIAGNOSIS — R2681 Unsteadiness on feet: Secondary | ICD-10-CM | POA: Diagnosis not present

## 2019-05-02 DIAGNOSIS — I10 Essential (primary) hypertension: Secondary | ICD-10-CM | POA: Diagnosis not present

## 2019-05-02 DIAGNOSIS — R42 Dizziness and giddiness: Secondary | ICD-10-CM | POA: Diagnosis not present

## 2019-05-02 DIAGNOSIS — M542 Cervicalgia: Secondary | ICD-10-CM | POA: Diagnosis not present

## 2019-05-02 DIAGNOSIS — R202 Paresthesia of skin: Secondary | ICD-10-CM | POA: Diagnosis not present

## 2019-05-07 DIAGNOSIS — R296 Repeated falls: Secondary | ICD-10-CM | POA: Diagnosis not present

## 2019-05-07 DIAGNOSIS — R42 Dizziness and giddiness: Secondary | ICD-10-CM | POA: Diagnosis not present

## 2019-05-07 DIAGNOSIS — R2681 Unsteadiness on feet: Secondary | ICD-10-CM | POA: Diagnosis not present

## 2019-05-07 DIAGNOSIS — Z9181 History of falling: Secondary | ICD-10-CM | POA: Diagnosis not present

## 2019-05-07 DIAGNOSIS — R262 Difficulty in walking, not elsewhere classified: Secondary | ICD-10-CM | POA: Diagnosis not present

## 2019-05-07 DIAGNOSIS — M542 Cervicalgia: Secondary | ICD-10-CM | POA: Diagnosis not present

## 2019-05-07 DIAGNOSIS — R202 Paresthesia of skin: Secondary | ICD-10-CM | POA: Diagnosis not present

## 2019-05-07 DIAGNOSIS — I10 Essential (primary) hypertension: Secondary | ICD-10-CM | POA: Diagnosis not present

## 2019-05-09 DIAGNOSIS — M542 Cervicalgia: Secondary | ICD-10-CM | POA: Diagnosis not present

## 2019-05-09 DIAGNOSIS — R2681 Unsteadiness on feet: Secondary | ICD-10-CM | POA: Diagnosis not present

## 2019-05-09 DIAGNOSIS — Z9181 History of falling: Secondary | ICD-10-CM | POA: Diagnosis not present

## 2019-05-09 DIAGNOSIS — R262 Difficulty in walking, not elsewhere classified: Secondary | ICD-10-CM | POA: Diagnosis not present

## 2019-05-09 DIAGNOSIS — R42 Dizziness and giddiness: Secondary | ICD-10-CM | POA: Diagnosis not present

## 2019-05-09 DIAGNOSIS — R296 Repeated falls: Secondary | ICD-10-CM | POA: Diagnosis not present

## 2019-05-09 DIAGNOSIS — I10 Essential (primary) hypertension: Secondary | ICD-10-CM | POA: Diagnosis not present

## 2019-05-09 DIAGNOSIS — R202 Paresthesia of skin: Secondary | ICD-10-CM | POA: Diagnosis not present

## 2019-05-16 DIAGNOSIS — R202 Paresthesia of skin: Secondary | ICD-10-CM | POA: Diagnosis not present

## 2019-05-16 DIAGNOSIS — M542 Cervicalgia: Secondary | ICD-10-CM | POA: Diagnosis not present

## 2019-05-16 DIAGNOSIS — R262 Difficulty in walking, not elsewhere classified: Secondary | ICD-10-CM | POA: Diagnosis not present

## 2019-05-16 DIAGNOSIS — Z9181 History of falling: Secondary | ICD-10-CM | POA: Diagnosis not present

## 2019-05-16 DIAGNOSIS — I10 Essential (primary) hypertension: Secondary | ICD-10-CM | POA: Diagnosis not present

## 2019-05-16 DIAGNOSIS — R42 Dizziness and giddiness: Secondary | ICD-10-CM | POA: Diagnosis not present

## 2019-05-16 DIAGNOSIS — R296 Repeated falls: Secondary | ICD-10-CM | POA: Diagnosis not present

## 2019-05-16 DIAGNOSIS — R2681 Unsteadiness on feet: Secondary | ICD-10-CM | POA: Diagnosis not present

## 2019-05-17 ENCOUNTER — Ambulatory Visit (INDEPENDENT_AMBULATORY_CARE_PROVIDER_SITE_OTHER): Payer: Medicare Other | Admitting: Licensed Clinical Social Worker

## 2019-05-17 DIAGNOSIS — F3341 Major depressive disorder, recurrent, in partial remission: Secondary | ICD-10-CM

## 2019-05-21 DIAGNOSIS — R42 Dizziness and giddiness: Secondary | ICD-10-CM | POA: Diagnosis not present

## 2019-05-21 DIAGNOSIS — Z9181 History of falling: Secondary | ICD-10-CM | POA: Diagnosis not present

## 2019-05-21 DIAGNOSIS — R2681 Unsteadiness on feet: Secondary | ICD-10-CM | POA: Diagnosis not present

## 2019-05-21 DIAGNOSIS — R202 Paresthesia of skin: Secondary | ICD-10-CM | POA: Diagnosis not present

## 2019-05-21 DIAGNOSIS — I10 Essential (primary) hypertension: Secondary | ICD-10-CM | POA: Diagnosis not present

## 2019-05-21 DIAGNOSIS — M542 Cervicalgia: Secondary | ICD-10-CM | POA: Diagnosis not present

## 2019-05-21 DIAGNOSIS — R262 Difficulty in walking, not elsewhere classified: Secondary | ICD-10-CM | POA: Diagnosis not present

## 2019-05-21 DIAGNOSIS — R296 Repeated falls: Secondary | ICD-10-CM | POA: Diagnosis not present

## 2019-05-28 DIAGNOSIS — R202 Paresthesia of skin: Secondary | ICD-10-CM | POA: Diagnosis not present

## 2019-05-28 DIAGNOSIS — M542 Cervicalgia: Secondary | ICD-10-CM | POA: Diagnosis not present

## 2019-05-28 DIAGNOSIS — R262 Difficulty in walking, not elsewhere classified: Secondary | ICD-10-CM | POA: Diagnosis not present

## 2019-05-28 DIAGNOSIS — I10 Essential (primary) hypertension: Secondary | ICD-10-CM | POA: Diagnosis not present

## 2019-05-28 DIAGNOSIS — R42 Dizziness and giddiness: Secondary | ICD-10-CM | POA: Diagnosis not present

## 2019-05-28 DIAGNOSIS — R2681 Unsteadiness on feet: Secondary | ICD-10-CM | POA: Diagnosis not present

## 2019-05-28 DIAGNOSIS — R296 Repeated falls: Secondary | ICD-10-CM | POA: Diagnosis not present

## 2019-05-28 DIAGNOSIS — Z9181 History of falling: Secondary | ICD-10-CM | POA: Diagnosis not present

## 2019-06-04 DIAGNOSIS — I10 Essential (primary) hypertension: Secondary | ICD-10-CM | POA: Diagnosis not present

## 2019-06-04 DIAGNOSIS — R202 Paresthesia of skin: Secondary | ICD-10-CM | POA: Diagnosis not present

## 2019-06-04 DIAGNOSIS — R262 Difficulty in walking, not elsewhere classified: Secondary | ICD-10-CM | POA: Diagnosis not present

## 2019-06-04 DIAGNOSIS — M542 Cervicalgia: Secondary | ICD-10-CM | POA: Diagnosis not present

## 2019-06-04 DIAGNOSIS — Z9181 History of falling: Secondary | ICD-10-CM | POA: Diagnosis not present

## 2019-06-04 DIAGNOSIS — R2681 Unsteadiness on feet: Secondary | ICD-10-CM | POA: Diagnosis not present

## 2019-06-04 DIAGNOSIS — R42 Dizziness and giddiness: Secondary | ICD-10-CM | POA: Diagnosis not present

## 2019-06-04 DIAGNOSIS — R296 Repeated falls: Secondary | ICD-10-CM | POA: Diagnosis not present

## 2019-06-19 DIAGNOSIS — G250 Essential tremor: Secondary | ICD-10-CM | POA: Diagnosis not present

## 2019-06-19 DIAGNOSIS — F418 Other specified anxiety disorders: Secondary | ICD-10-CM | POA: Diagnosis not present

## 2019-06-19 DIAGNOSIS — G40009 Localization-related (focal) (partial) idiopathic epilepsy and epileptic syndromes with seizures of localized onset, not intractable, without status epilepticus: Secondary | ICD-10-CM | POA: Diagnosis not present

## 2019-06-19 DIAGNOSIS — R296 Repeated falls: Secondary | ICD-10-CM | POA: Diagnosis not present

## 2019-06-19 DIAGNOSIS — I10 Essential (primary) hypertension: Secondary | ICD-10-CM | POA: Diagnosis not present

## 2019-06-26 ENCOUNTER — Ambulatory Visit (INDEPENDENT_AMBULATORY_CARE_PROVIDER_SITE_OTHER): Payer: Medicare Other | Admitting: Licensed Clinical Social Worker

## 2019-06-26 DIAGNOSIS — F3341 Major depressive disorder, recurrent, in partial remission: Secondary | ICD-10-CM | POA: Diagnosis not present

## 2019-07-13 DIAGNOSIS — R0602 Shortness of breath: Secondary | ICD-10-CM | POA: Diagnosis not present

## 2019-07-13 DIAGNOSIS — Z20828 Contact with and (suspected) exposure to other viral communicable diseases: Secondary | ICD-10-CM | POA: Diagnosis not present

## 2019-07-13 DIAGNOSIS — R51 Headache: Secondary | ICD-10-CM | POA: Diagnosis not present

## 2019-07-24 ENCOUNTER — Ambulatory Visit (INDEPENDENT_AMBULATORY_CARE_PROVIDER_SITE_OTHER): Payer: Medicare Other | Admitting: Licensed Clinical Social Worker

## 2019-07-24 DIAGNOSIS — F3341 Major depressive disorder, recurrent, in partial remission: Secondary | ICD-10-CM

## 2019-08-10 DIAGNOSIS — Z23 Encounter for immunization: Secondary | ICD-10-CM | POA: Diagnosis not present

## 2019-08-28 ENCOUNTER — Ambulatory Visit (INDEPENDENT_AMBULATORY_CARE_PROVIDER_SITE_OTHER): Payer: Medicare Other | Admitting: Licensed Clinical Social Worker

## 2019-08-28 DIAGNOSIS — F3341 Major depressive disorder, recurrent, in partial remission: Secondary | ICD-10-CM

## 2019-09-13 DIAGNOSIS — M7989 Other specified soft tissue disorders: Secondary | ICD-10-CM | POA: Diagnosis not present

## 2019-09-13 DIAGNOSIS — M79644 Pain in right finger(s): Secondary | ICD-10-CM | POA: Diagnosis not present

## 2019-09-13 DIAGNOSIS — S6991XA Unspecified injury of right wrist, hand and finger(s), initial encounter: Secondary | ICD-10-CM | POA: Diagnosis not present

## 2019-09-19 DIAGNOSIS — B379 Candidiasis, unspecified: Secondary | ICD-10-CM | POA: Diagnosis not present

## 2019-09-19 DIAGNOSIS — Z23 Encounter for immunization: Secondary | ICD-10-CM | POA: Diagnosis not present

## 2019-09-19 DIAGNOSIS — T3695XA Adverse effect of unspecified systemic antibiotic, initial encounter: Secondary | ICD-10-CM | POA: Diagnosis not present

## 2019-09-19 DIAGNOSIS — L03032 Cellulitis of left toe: Secondary | ICD-10-CM | POA: Diagnosis not present

## 2019-09-24 DIAGNOSIS — L03032 Cellulitis of left toe: Secondary | ICD-10-CM | POA: Diagnosis not present

## 2019-09-24 DIAGNOSIS — G40009 Localization-related (focal) (partial) idiopathic epilepsy and epileptic syndromes with seizures of localized onset, not intractable, without status epilepticus: Secondary | ICD-10-CM | POA: Diagnosis not present

## 2019-09-24 DIAGNOSIS — I10 Essential (primary) hypertension: Secondary | ICD-10-CM | POA: Diagnosis not present

## 2019-09-24 DIAGNOSIS — R296 Repeated falls: Secondary | ICD-10-CM | POA: Diagnosis not present

## 2019-09-24 DIAGNOSIS — I63112 Cerebral infarction due to embolism of left vertebral artery: Secondary | ICD-10-CM | POA: Diagnosis not present

## 2019-09-24 DIAGNOSIS — J0100 Acute maxillary sinusitis, unspecified: Secondary | ICD-10-CM | POA: Diagnosis not present

## 2019-09-24 DIAGNOSIS — G250 Essential tremor: Secondary | ICD-10-CM | POA: Diagnosis not present

## 2019-10-02 DIAGNOSIS — J019 Acute sinusitis, unspecified: Secondary | ICD-10-CM | POA: Diagnosis not present

## 2019-10-02 DIAGNOSIS — Z20828 Contact with and (suspected) exposure to other viral communicable diseases: Secondary | ICD-10-CM | POA: Diagnosis not present

## 2019-10-02 DIAGNOSIS — R197 Diarrhea, unspecified: Secondary | ICD-10-CM | POA: Diagnosis not present

## 2019-10-02 DIAGNOSIS — R112 Nausea with vomiting, unspecified: Secondary | ICD-10-CM | POA: Diagnosis not present

## 2019-10-09 ENCOUNTER — Ambulatory Visit: Payer: Medicare Other | Admitting: Licensed Clinical Social Worker

## 2019-10-09 ENCOUNTER — Telehealth (HOSPITAL_COMMUNITY): Payer: Self-pay

## 2019-10-09 NOTE — Telephone Encounter (Signed)
Called to schedule f/u mri. Pt does not want to schedule right now. She has been sick for a couple weeks. She will call to schedule once she is better. AW

## 2019-10-11 ENCOUNTER — Ambulatory Visit (INDEPENDENT_AMBULATORY_CARE_PROVIDER_SITE_OTHER): Payer: Medicare Other | Admitting: Licensed Clinical Social Worker

## 2019-10-11 DIAGNOSIS — F3341 Major depressive disorder, recurrent, in partial remission: Secondary | ICD-10-CM

## 2019-11-07 ENCOUNTER — Telehealth: Payer: Medicare Other | Admitting: Neurology

## 2019-11-13 ENCOUNTER — Ambulatory Visit: Payer: Medicare Other | Admitting: Licensed Clinical Social Worker

## 2019-11-22 ENCOUNTER — Other Ambulatory Visit (HOSPITAL_COMMUNITY): Payer: Self-pay | Admitting: Interventional Radiology

## 2019-11-22 ENCOUNTER — Ambulatory Visit (INDEPENDENT_AMBULATORY_CARE_PROVIDER_SITE_OTHER): Payer: Medicare Other | Admitting: Licensed Clinical Social Worker

## 2019-11-22 DIAGNOSIS — F3341 Major depressive disorder, recurrent, in partial remission: Secondary | ICD-10-CM | POA: Diagnosis not present

## 2019-11-22 DIAGNOSIS — I729 Aneurysm of unspecified site: Secondary | ICD-10-CM

## 2019-11-22 DIAGNOSIS — D1800 Hemangioma unspecified site: Secondary | ICD-10-CM

## 2019-12-18 ENCOUNTER — Ambulatory Visit (INDEPENDENT_AMBULATORY_CARE_PROVIDER_SITE_OTHER): Payer: Medicare Other | Admitting: Licensed Clinical Social Worker

## 2019-12-18 DIAGNOSIS — F3341 Major depressive disorder, recurrent, in partial remission: Secondary | ICD-10-CM

## 2019-12-25 ENCOUNTER — Ambulatory Visit (HOSPITAL_COMMUNITY): Payer: Medicare Other

## 2019-12-25 ENCOUNTER — Other Ambulatory Visit: Payer: Self-pay

## 2019-12-25 ENCOUNTER — Other Ambulatory Visit (HOSPITAL_COMMUNITY): Payer: Self-pay | Admitting: Interventional Radiology

## 2019-12-25 ENCOUNTER — Ambulatory Visit (HOSPITAL_COMMUNITY)
Admission: RE | Admit: 2019-12-25 | Discharge: 2019-12-25 | Disposition: A | Discharge status: Home / Self Care | Payer: Medicare Other | Source: Ambulatory Visit | Attending: Interventional Radiology | Admitting: Interventional Radiology

## 2019-12-25 DIAGNOSIS — S15002D Unspecified injury of left carotid artery, subsequent encounter: Secondary | ICD-10-CM

## 2019-12-25 DIAGNOSIS — I729 Aneurysm of unspecified site: Secondary | ICD-10-CM | POA: Diagnosis present

## 2019-12-25 DIAGNOSIS — D1800 Hemangioma unspecified site: Secondary | ICD-10-CM | POA: Diagnosis not present

## 2019-12-28 ENCOUNTER — Telehealth (HOSPITAL_COMMUNITY): Payer: Self-pay | Admitting: Radiology

## 2019-12-28 NOTE — Telephone Encounter (Signed)
Called pt, told her per Deveshwar to f/u in 1 year with MRI/MRA. Pt agrees with this plan of care. JM

## 2020-01-15 ENCOUNTER — Ambulatory Visit (INDEPENDENT_AMBULATORY_CARE_PROVIDER_SITE_OTHER): Payer: Medicare Other | Admitting: Licensed Clinical Social Worker

## 2020-01-15 DIAGNOSIS — F3340 Major depressive disorder, recurrent, in remission, unspecified: Secondary | ICD-10-CM | POA: Diagnosis not present

## 2020-04-24 ENCOUNTER — Ambulatory Visit: Payer: Medicare Other | Admitting: Psychology

## 2020-05-22 ENCOUNTER — Ambulatory Visit: Payer: Medicare Other | Admitting: Psychology

## 2020-05-29 ENCOUNTER — Ambulatory Visit: Payer: Medicare Other | Admitting: Psychology

## 2020-06-10 ENCOUNTER — Ambulatory Visit: Payer: Medicare Other | Admitting: Psychology

## 2020-07-22 ENCOUNTER — Ambulatory Visit (INDEPENDENT_AMBULATORY_CARE_PROVIDER_SITE_OTHER): Payer: Medicare Other | Admitting: Psychology

## 2020-07-22 DIAGNOSIS — F3341 Major depressive disorder, recurrent, in partial remission: Secondary | ICD-10-CM | POA: Diagnosis not present

## 2020-08-15 ENCOUNTER — Ambulatory Visit (INDEPENDENT_AMBULATORY_CARE_PROVIDER_SITE_OTHER): Payer: Medicare Other | Admitting: Psychology

## 2020-08-15 DIAGNOSIS — F3341 Major depressive disorder, recurrent, in partial remission: Secondary | ICD-10-CM

## 2020-09-09 ENCOUNTER — Ambulatory Visit (INDEPENDENT_AMBULATORY_CARE_PROVIDER_SITE_OTHER): Payer: Medicare Other | Admitting: Psychology

## 2020-09-09 DIAGNOSIS — F3341 Major depressive disorder, recurrent, in partial remission: Secondary | ICD-10-CM

## 2020-10-03 ENCOUNTER — Ambulatory Visit (INDEPENDENT_AMBULATORY_CARE_PROVIDER_SITE_OTHER): Payer: Medicare Other | Admitting: Psychology

## 2020-10-03 DIAGNOSIS — F3341 Major depressive disorder, recurrent, in partial remission: Secondary | ICD-10-CM

## 2020-11-06 ENCOUNTER — Ambulatory Visit: Payer: Medicare Other | Admitting: Psychology

## 2020-11-13 ENCOUNTER — Telehealth: Payer: Self-pay | Admitting: Neurology

## 2020-11-13 NOTE — Telephone Encounter (Signed)
Patient's son called in wanting to give some more details. He stated after he spoke with the on call nurse after hours they took the patient to the ED and the patient refused treatment. He was also told by a neighbor that they found her this morning trying to make calls on a dead cell phone. He was able to speak with her and she was screaming and crying for him to come help her. He is not sure if he should take her back to the ED or exactly what he should do at this point?

## 2020-11-13 NOTE — Telephone Encounter (Signed)
This sounds like a significant change, although I have not seen her in over a year. Has she started any new medications recently, any changes in sleep, mood prior to this? I don't see that a urinalysis was done in the ER. Recommend evaluation with PCP

## 2020-11-13 NOTE — Telephone Encounter (Signed)
Tried calling pt son. No answer LMOVM to call us back

## 2021-01-13 ENCOUNTER — Telehealth (HOSPITAL_COMMUNITY): Payer: Self-pay

## 2021-01-13 ENCOUNTER — Other Ambulatory Visit (HOSPITAL_COMMUNITY): Payer: Self-pay | Admitting: Interventional Radiology

## 2021-01-13 DIAGNOSIS — I671 Cerebral aneurysm, nonruptured: Secondary | ICD-10-CM

## 2021-01-13 DIAGNOSIS — D1800 Hemangioma unspecified site: Secondary | ICD-10-CM

## 2021-01-13 NOTE — Telephone Encounter (Signed)
Called to schedule f/u mri/mra, no answer, left vm. AW

## 2021-02-17 ENCOUNTER — Ambulatory Visit (HOSPITAL_COMMUNITY): Admission: RE | Admit: 2021-02-17 | Payer: Medicare Other | Source: Ambulatory Visit

## 2021-02-17 ENCOUNTER — Other Ambulatory Visit: Payer: Self-pay

## 2021-02-17 ENCOUNTER — Ambulatory Visit (HOSPITAL_COMMUNITY)
Admission: RE | Admit: 2021-02-17 | Discharge: 2021-02-17 | Disposition: A | Discharge status: Home / Self Care | Payer: Medicare Other | Source: Ambulatory Visit | Attending: Interventional Radiology | Admitting: Interventional Radiology

## 2021-02-17 DIAGNOSIS — I671 Cerebral aneurysm, nonruptured: Secondary | ICD-10-CM | POA: Insufficient documentation

## 2021-02-17 DIAGNOSIS — D1800 Hemangioma unspecified site: Secondary | ICD-10-CM | POA: Insufficient documentation

## 2021-02-18 ENCOUNTER — Telehealth (HOSPITAL_COMMUNITY): Payer: Self-pay

## 2021-02-18 NOTE — Telephone Encounter (Signed)
Pt agreed to f/u in 1 year with mri/mra. AW  

## 2021-08-31 ENCOUNTER — Ambulatory Visit (INDEPENDENT_AMBULATORY_CARE_PROVIDER_SITE_OTHER): Payer: Medicare Other | Admitting: Neurology

## 2021-08-31 ENCOUNTER — Encounter: Payer: Self-pay | Admitting: Neurology

## 2021-08-31 ENCOUNTER — Other Ambulatory Visit: Payer: Self-pay

## 2021-08-31 DIAGNOSIS — G40909 Epilepsy, unspecified, not intractable, without status epilepticus: Secondary | ICD-10-CM

## 2021-08-31 MED ORDER — DILANTIN 100 MG PO CAPS: ORAL_CAPSULE | ORAL | 3 refills | Status: AC

## 2021-08-31 NOTE — Progress Notes (Signed)
NEUROLOGY FOLLOW UP OFFICE NOTE  Allison Bass [id number] 14-May-1941  HISTORY OF PRESENT ILLNESS: I had the pleasure of seeing [name] in follow-up in the neurology clinic on 08/31/2021.  The patient was last seen over 2 years ago. At that time, she was seen after 2 transient episodes of memory difficulties and loss of consciousness in 2017. 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 Stroke workup showed unremarkable carotid doppler, echo normal with EF 65-70%. 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 Estanislado Pandy has been monitoring. Her last brain MRI/MRA in 01/2021 were stable. She presents today for seizure follow-up.   She continues to be seizure-free since 1995 on chronic Dilantin therapy, taking 300mg  every morning without side effects. She had an episode of loss of consciousness in January 2022 due to a UTI and electrolyte abnormalities. She reports that she had been falling a lot up until that time, but denies any further falls since January. Her gait/balance have improved as well. She lives alone. She denies any staring/unresponsive episodes. She has essential tremor with good response to Propranolol 40mg  BID. She does not sleep well and takes mirtazapine. She denies any headaches, dizziness, diplopia. Mood is good.   History on Initial Assessment 01/26/2016: This is a pleasant 80 yo RH woman with a history of hypertension, depression, anxiety, and seizures since her late 80s. The first seizure occurred in the early 1990s, she recalls feeling funny, "almost like I was going under surgery," then waking up in the hospital. Her husband had witnessed a generalized convulsion. She  was started on Dilantin. She had been seizure-free for a while and a trial of Dilantin taper was done perhaps in 1995, during which she had a couple of breakthrough seizures within a week of stopping medication. She denies any further seizures since restarting the Dilantin in 1995. She denies any further episodes of the funny feeling, no  olfactory/gustatory hallucinations, deja vu,focal numbness/tingling/weakness, myoclonic jerks.  Her mother had seizures. Otherwise she had a normal birth and early development.  There is no history of febrile convulsions, CNS infections such as meningitis/encephalitis, significant traumatic brain injury, neurosurgical procedures, or family history of seizures.  She had been doing well until 01/12/16 she woke up and knew there was something she was supposed to do but could not recall it. She called her friend she needed to pick up food for [employer] Whe[employer], and after the reminder, she did not have any other issues that day. The next morning she got up and went to pick up food, then she could not think of where her next destination was. She had to call someone to remind her where to go, then she was fine. There is note that the day prior to the first episode she was upset because her cat ran away. She took her normal TID Xanax and then took 2 more prior to bed to help with sleep.  On 01/18/16, she was at church then had an intense feeling with nausea. The music was coming off the page, she got extremely hot and could hear things from a distance. She started to sit down and recalls her friend saying she could not lift her. She apparently  fainted and woke up spread out on several chairs. She did not have the same "funny feeling" she had in the 1990s. No convulsive activity reported. She denied any sleep deprivation or alcohol the night prior.   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      ALPRAZolam (XANAX) 0.5 MG tablet TAKE ONE TABLET BY MOUTH THREE TIMES A DAY 90 tablet 2   Cholecalciferol 10 MCG (400 UNIT) CHEW Chew by mouth.     ciclopirox (PENLAC) 8 % solution Apply topically.     cyanocobalamin 1000 MCG tablet Take 1 tablet by mouth daily.     desvenlafaxine (PRISTIQ) 100 MG 24 hr tablet TAKE ONE TABLET BY MOUTH ONE TIME DAILY (Patient taking differently: 300 mg.) 90 tablet 3   DILANTIN 100 MG ER capsule TAKE THREE CAPSULES BY MOUTH ONE TIME DAILY 270 capsule 0   estrogens, conjugated, (PREMARIN) 1.25 MG tablet Take 1 tablet (1.25 mg total) by mouth daily. 90 tablet 0   hydrochlorothiazide (HYDRODIURIL) 50 MG tablet TAKE ONE TABLET BY MOUTH ONE TIME DAILY (Patient taking differently: 12.5 mg. TAKE ONE TABLET BY MOUTH ONE TIME DAILY) 90 tablet 0   magnesium oxide (MAG-OX) 400 MG tablet Take two tablets (800mg ) in the AM and one tablet (400mg ) in the PM.     mirtazapine (REMERON) 15 MG tablet Take 22.5 mg by mouth at bedtime.     MYRBETRIQ 50 MG TB24 tablet Take 50 mg by mouth daily.     potassium chloride (K-DUR) 10 MEQ tablet TAKE TWO TABLETS BY MOUTH EVERY MORNING AND TAKE TWO TABLETS BY MOUTH EVERY EVENING 360 tablet 0   propranolol (INDERAL) 40 MG tablet Take 40 mg by mouth 2  (two) times daily.     thiamine 100 MG tablet Take by mouth.     atorvastatin (LIPITOR) 40 MG tablet TAKE ONE TABLET BY MOUTH ONE TIME DAILY (Patient not taking: Reported on 08/31/2021) 90 tablet 0   No current facility-administered medications on file prior to visit.    ALLERGIES: Allergies  Allergen Reactions   Codeine Nausea And Vomiting, Rash and Other (See Comments)    Makes the patient walk into walls Makes the patient walk into walls    Amoxicillin-Pot Clavulanate Nausea And Vomiting   Erythromycin Other (See Comments)    UNSPECIFIED REACTION    Garlic     FAMILY HISTORY: Family History  Problem Relation Age of Onset   Alcoholism Father    Lung cancer Father  Smoker   Stroke Father    High blood pressure Father    Breast cancer Maternal Grandmother    Heart disease Mother    High blood pressure Mother    Depression Mother    Seizures Mother    Colon cancer Neg Hx    Esophageal cancer Neg Hx    Stomach cancer Neg Hx    Rectal cancer Neg Hx     SOCIAL HISTORY: Social History   Socioeconomic History   Marital status: Widowed    Spouse name: Not on file   Number of children: 3   Years of education: Not on file   Highest education level: Not on file  Occupational History   Occupation: Retired    Comment: retired  Tobacco Use   Smoking status: Never   Smokeless tobacco: Never  Vaping Use   Vaping Use: Never used  Substance and Sexual Activity   Alcohol use: Yes    Comment: rare   Drug use: No   Sexual activity: Not on file  Other Topics Concern   Not on file  Social History Narrative   Right handed    Social Determinants of Health   Financial Resource Strain: Not on file  Food Insecurity: Not on file  Transportation Needs: Not on file  Physical Activity: Not on file  Stress: Not on file  Social Connections: Not on file  Intimate Partner Violence: Not on file     PHYSICAL EXAM: Vitals:   08/31/21 1107  BP: 137/79  Pulse: 72   SpO2: 99%   General: No acute distress Head:  Normocephalic/atraumatic Skin/Extremities: No rash, no edema Neurological Exam: alert and awake. No aphasia or dysarthria. Fund of knowledge is appropriate.  Recent and remote memory are intact.  Attention and concentration are normal.   Cranial nerves: Pupils equal, round. Extraocular movements intact with no nystagmus. Visual fields full.  No facial asymmetry.  Motor: Bulk and tone normal, muscle strength 5/5 throughout with no pronator drift.   Finger to nose testing intact.  Gait narrow-based and steady, no ataxia.  Romberg negative.   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 She has had breakthrough convulsions with attempts at Dilantin taper in the past. She remains seizure-free since 1995 on Dilantin 300mg  daily, refills sent. She was initially seen in 2017 for 2 episodes of memory loss and an episode of loss of consciousness last 01/18/16. 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 She is doing much better with no further falls since January 2022, gait has improved with physical therapy. Follow-up in 1 year, she knows to call for any changes.   Thank you for allowing me to participate in her care.  Please do not hesitate to call for any questions or concerns.   Ellouise Newer, M.D.   CC: Dr. Redmond Pulling

## 2021-08-31 NOTE — Patient Instructions (Signed)
Always a pleasure to see you. Refills have been sent for Dilantin 100mg : take 3 capsules daily. Follow-up in 1 year, call for any changes.   Seizure Precautions: 1. If medication has been prescribed for you to prevent seizures, take it exactly as directed.  Do not stop taking the medicine without talking to your doctor first, even if you have not had a seizure in a long time.   2. Avoid activities in which a seizure would cause danger to yourself or to others.  Don't operate dangerous machinery, swim alone, or climb in high or dangerous places, such as on ladders, roofs, or girders.  Do not drive unless your doctor says you may.  3. If you have any warning that you may have a seizure, lay down in a safe place where you can't hurt yourself.    4.  No driving for 6 months from last seizure, as per [facility].   Please refer to the following link on the [employer] website for more information: http://[email or web address]/[email or web address]/[email or web address]   5.  Maintain good sleep hygiene. Avoid alcohol.  6.  Contact your doctor if you have any problems that may be related to the medicine you are taking.  7.  Call 911 and bring the patient back to the ED if:        A.  The seizure lasts longer than 5 minutes.       B.  The patient doesn't awaken shortly after the seizure  C.  The patient has new problems such as difficulty seeing, speaking or moving  D.  The patient was injured during the seizure  E.  The patient has a temperature over 102 F (39C)  F.  The patient vomited and now is having trouble breathing

## 2022-07-13 ENCOUNTER — Telehealth (HOSPITAL_COMMUNITY): Payer: Self-pay | Admitting: Student

## 2022-07-13 ENCOUNTER — Telehealth (HOSPITAL_COMMUNITY): Payer: Self-pay

## 2022-07-13 ENCOUNTER — Other Ambulatory Visit (HOSPITAL_COMMUNITY): Payer: Self-pay | Admitting: Interventional Radiology

## 2022-07-13 DIAGNOSIS — D1800 Hemangioma unspecified site: Secondary | ICD-10-CM

## 2022-07-13 DIAGNOSIS — I671 Cerebral aneurysm, nonruptured: Secondary | ICD-10-CM

## 2022-07-13 MED ORDER — LORAZEPAM 1 MG PO TABS: 1.0000 mg | ORAL_TABLET | Freq: Two times a day (BID) | ORAL | 0 refills | Status: AC

## 2022-07-13 NOTE — Telephone Encounter (Signed)
Patient scheduled for routine MR imaging 07/29/22 and she requires anti-anxiety medication for the procedure. Ativan 1 mg tablet PO x 2 tablets e-prescribed to the patient's pharmacy ([facility] in [facility]). I spoke with the patient via telephone and let her know this prescription has been sent in for her. She understands she should take 1 tablet five hours prior to her procedure. She can take the second tablet one hour prior to her procedure if needed.  Soyla Dryer, [place] [phone] 07/13/2022, 2:18 PM

## 2022-07-13 NOTE — Progress Notes (Signed)
Encounter opened in error

## 2022-07-13 NOTE — Telephone Encounter (Signed)
Called to schedule mri, no answer, left vm. AW 

## 2022-07-29 ENCOUNTER — Telehealth (HOSPITAL_COMMUNITY): Payer: Self-pay

## 2022-07-29 ENCOUNTER — Ambulatory Visit (HOSPITAL_COMMUNITY): Admission: RE | Admit: 2022-07-29 | Payer: Medicare Other | Source: Ambulatory Visit

## 2022-07-29 ENCOUNTER — Other Ambulatory Visit (HOSPITAL_COMMUNITY): Payer: Self-pay | Admitting: Physician Assistant

## 2022-07-29 MED ORDER — LORAZEPAM 1 MG PO TABS: 1.0000 mg | ORAL_TABLET | Freq: Once | ORAL | 0 refills | Status: AC

## 2022-07-29 NOTE — Telephone Encounter (Signed)
Pt called with questions regarding her upcoming appt. Gave her parking instructions. AW

## 2022-08-01 ENCOUNTER — Ambulatory Visit (HOSPITAL_COMMUNITY)
Admission: RE | Admit: 2022-08-01 | Discharge: 2022-08-01 | Disposition: A | Discharge status: Home / Self Care | Payer: Medicare Other | Source: Ambulatory Visit | Attending: Interventional Radiology | Admitting: Interventional Radiology

## 2022-08-01 DIAGNOSIS — I671 Cerebral aneurysm, nonruptured: Secondary | ICD-10-CM | POA: Insufficient documentation

## 2022-08-01 DIAGNOSIS — D1800 Hemangioma unspecified site: Secondary | ICD-10-CM | POA: Diagnosis present

## 2022-08-04 ENCOUNTER — Telehealth (HOSPITAL_COMMUNITY): Payer: Self-pay

## 2022-08-04 NOTE — Telephone Encounter (Signed)
Pt agreed to f/u in  year with an mra. AW

## 2022-09-03 ENCOUNTER — Ambulatory Visit: Payer: Medicare Other | Admitting: Neurology

## 2023-02-01 ENCOUNTER — Other Ambulatory Visit (HOSPITAL_COMMUNITY): Payer: Self-pay | Admitting: Interventional Radiology

## 2023-02-01 DIAGNOSIS — I671 Cerebral aneurysm, nonruptured: Secondary | ICD-10-CM

## 2023-02-01 DIAGNOSIS — D1800 Hemangioma unspecified site: Secondary | ICD-10-CM

## 2023-07-29 ENCOUNTER — Ambulatory Visit (HOSPITAL_COMMUNITY)
Admission: RE | Admit: 2023-07-29 | Discharge: 2023-07-29 | Disposition: A | Discharge status: Home / Self Care | Payer: Medicare Other | Source: Ambulatory Visit | Attending: Interventional Radiology | Admitting: Interventional Radiology

## 2023-07-29 DIAGNOSIS — I671 Cerebral aneurysm, nonruptured: Secondary | ICD-10-CM | POA: Insufficient documentation

## 2023-07-29 DIAGNOSIS — D1800 Hemangioma unspecified site: Secondary | ICD-10-CM | POA: Diagnosis present

## 2023-08-08 ENCOUNTER — Telehealth (HOSPITAL_COMMUNITY): Payer: Self-pay

## 2023-08-08 NOTE — Telephone Encounter (Signed)
Called pt regarding recent imaging, no answer, left vm. AB

## 2023-08-08 NOTE — Telephone Encounter (Signed)
Pt agreed to f/u in 2 years with an mra. AB

## 2024-04-21 ENCOUNTER — Encounter (HOSPITAL_COMMUNITY): Payer: Self-pay | Admitting: Interventional Radiology
